# Patient Record
Sex: Female | Born: 1935
Health system: Southern US, Community
[De-identification: ages and names within clinical notes are randomized; demographics above are authoritative.]

## PROBLEM LIST (undated history)

## (undated) DIAGNOSIS — E785 Hyperlipidemia, unspecified: Secondary | ICD-10-CM

## (undated) DIAGNOSIS — C50911 Malignant neoplasm of unspecified site of right female breast: Secondary | ICD-10-CM

## (undated) DIAGNOSIS — H409 Unspecified glaucoma: Secondary | ICD-10-CM

## (undated) DIAGNOSIS — I10 Essential (primary) hypertension: Secondary | ICD-10-CM

## (undated) DIAGNOSIS — M1990 Unspecified osteoarthritis, unspecified site: Secondary | ICD-10-CM

## (undated) DIAGNOSIS — C50919 Malignant neoplasm of unspecified site of unspecified female breast: Secondary | ICD-10-CM

## (undated) HISTORY — PX: TYMPANOPLASTY: SHX33

## (undated) HISTORY — PX: TONSILLECTOMY: SUR1361

---

## 1970-07-12 DIAGNOSIS — C50919 Malignant neoplasm of unspecified site of unspecified female breast: Secondary | ICD-10-CM

## 1970-07-12 HISTORY — DX: Malignant neoplasm of unspecified site of unspecified female breast: C50.919

## 1970-08-12 HISTORY — PX: MASTECTOMY: SHX3

## 1970-08-12 HISTORY — PX: BREAST BIOPSY: SHX20

## 1971-09-12 HISTORY — PX: ABDOMINAL HYSTERECTOMY: SHX81

## 1998-02-26 ENCOUNTER — Encounter: Payer: Self-pay | Admitting: Endocrinology

## 1998-02-26 ENCOUNTER — Ambulatory Visit (HOSPITAL_COMMUNITY): Admission: RE | Admit: 1998-02-26 | Discharge: 1998-02-26 | Payer: Self-pay | Admitting: Endocrinology

## 1999-03-03 ENCOUNTER — Encounter: Payer: Self-pay | Admitting: Endocrinology

## 1999-03-03 ENCOUNTER — Ambulatory Visit (HOSPITAL_COMMUNITY): Admission: RE | Admit: 1999-03-03 | Discharge: 1999-03-03 | Payer: Self-pay | Admitting: Endocrinology

## 1999-03-10 ENCOUNTER — Other Ambulatory Visit: Admission: RE | Admit: 1999-03-10 | Discharge: 1999-03-10 | Payer: Self-pay | Admitting: Endocrinology

## 1999-04-02 ENCOUNTER — Ambulatory Visit (HOSPITAL_COMMUNITY): Admission: RE | Admit: 1999-04-02 | Discharge: 1999-04-02 | Payer: Self-pay | Admitting: Endocrinology

## 1999-04-02 ENCOUNTER — Encounter: Payer: Self-pay | Admitting: Endocrinology

## 2000-03-21 ENCOUNTER — Other Ambulatory Visit: Admission: RE | Admit: 2000-03-21 | Discharge: 2000-03-21 | Payer: Self-pay | Admitting: Endocrinology

## 2000-06-16 ENCOUNTER — Encounter: Payer: Self-pay | Admitting: Endocrinology

## 2000-06-16 ENCOUNTER — Ambulatory Visit (HOSPITAL_COMMUNITY): Admission: RE | Admit: 2000-06-16 | Discharge: 2000-06-16 | Payer: Self-pay | Admitting: Endocrinology

## 2001-03-27 ENCOUNTER — Other Ambulatory Visit: Admission: RE | Admit: 2001-03-27 | Discharge: 2001-03-27 | Payer: Self-pay | Admitting: Endocrinology

## 2001-06-14 ENCOUNTER — Ambulatory Visit (HOSPITAL_COMMUNITY): Admission: RE | Admit: 2001-06-14 | Discharge: 2001-06-14 | Payer: Self-pay | Admitting: *Deleted

## 2001-06-14 ENCOUNTER — Encounter (INDEPENDENT_AMBULATORY_CARE_PROVIDER_SITE_OTHER): Payer: Self-pay | Admitting: Specialist

## 2001-06-28 ENCOUNTER — Encounter: Payer: Self-pay | Admitting: Endocrinology

## 2001-06-28 ENCOUNTER — Ambulatory Visit (HOSPITAL_COMMUNITY): Admission: RE | Admit: 2001-06-28 | Discharge: 2001-06-28 | Payer: Self-pay | Admitting: Endocrinology

## 2002-07-02 ENCOUNTER — Ambulatory Visit (HOSPITAL_COMMUNITY): Admission: RE | Admit: 2002-07-02 | Discharge: 2002-07-02 | Payer: Self-pay | Admitting: Endocrinology

## 2002-07-02 ENCOUNTER — Encounter: Payer: Self-pay | Admitting: Endocrinology

## 2002-10-10 ENCOUNTER — Inpatient Hospital Stay (HOSPITAL_COMMUNITY): Admission: EM | Admit: 2002-10-10 | Discharge: 2002-10-14 | Payer: Self-pay | Admitting: Emergency Medicine

## 2002-10-10 ENCOUNTER — Encounter: Payer: Self-pay | Admitting: Emergency Medicine

## 2002-12-04 ENCOUNTER — Encounter (INDEPENDENT_AMBULATORY_CARE_PROVIDER_SITE_OTHER): Payer: Self-pay | Admitting: *Deleted

## 2002-12-04 ENCOUNTER — Ambulatory Visit (HOSPITAL_COMMUNITY): Admission: RE | Admit: 2002-12-04 | Discharge: 2002-12-04 | Payer: Self-pay | Admitting: *Deleted

## 2003-06-24 ENCOUNTER — Other Ambulatory Visit: Admission: RE | Admit: 2003-06-24 | Discharge: 2003-06-24 | Payer: Self-pay | Admitting: Endocrinology

## 2003-07-03 ENCOUNTER — Ambulatory Visit (HOSPITAL_COMMUNITY): Admission: RE | Admit: 2003-07-03 | Discharge: 2003-07-03 | Payer: Self-pay | Admitting: Endocrinology

## 2003-07-08 ENCOUNTER — Encounter: Admission: RE | Admit: 2003-07-08 | Discharge: 2003-07-08 | Payer: Self-pay | Admitting: Endocrinology

## 2004-01-29 ENCOUNTER — Ambulatory Visit (HOSPITAL_COMMUNITY): Admission: RE | Admit: 2004-01-29 | Discharge: 2004-01-29 | Payer: Self-pay | Admitting: *Deleted

## 2004-01-29 ENCOUNTER — Encounter (INDEPENDENT_AMBULATORY_CARE_PROVIDER_SITE_OTHER): Payer: Self-pay | Admitting: Specialist

## 2004-07-21 ENCOUNTER — Ambulatory Visit (HOSPITAL_COMMUNITY): Admission: RE | Admit: 2004-07-21 | Discharge: 2004-07-21 | Payer: Self-pay | Admitting: Endocrinology

## 2004-08-17 ENCOUNTER — Encounter (INDEPENDENT_AMBULATORY_CARE_PROVIDER_SITE_OTHER): Payer: Self-pay | Admitting: *Deleted

## 2004-08-17 ENCOUNTER — Ambulatory Visit: Admission: RE | Admit: 2004-08-17 | Discharge: 2004-08-17 | Payer: Self-pay | Admitting: *Deleted

## 2004-10-14 ENCOUNTER — Ambulatory Visit (HOSPITAL_COMMUNITY): Admission: RE | Admit: 2004-10-14 | Discharge: 2004-10-14 | Payer: Self-pay | Admitting: *Deleted

## 2005-07-22 ENCOUNTER — Ambulatory Visit (HOSPITAL_COMMUNITY): Admission: RE | Admit: 2005-07-22 | Discharge: 2005-07-22 | Payer: Self-pay | Admitting: Endocrinology

## 2005-10-13 ENCOUNTER — Encounter (INDEPENDENT_AMBULATORY_CARE_PROVIDER_SITE_OTHER): Payer: Self-pay | Admitting: *Deleted

## 2005-10-13 ENCOUNTER — Ambulatory Visit (HOSPITAL_COMMUNITY): Admission: RE | Admit: 2005-10-13 | Discharge: 2005-10-13 | Payer: Self-pay | Admitting: *Deleted

## 2006-10-05 ENCOUNTER — Ambulatory Visit (HOSPITAL_COMMUNITY): Admission: RE | Admit: 2006-10-05 | Discharge: 2006-10-05 | Payer: Self-pay | Admitting: Endocrinology

## 2006-10-26 ENCOUNTER — Encounter (INDEPENDENT_AMBULATORY_CARE_PROVIDER_SITE_OTHER): Payer: Self-pay | Admitting: *Deleted

## 2006-10-26 ENCOUNTER — Ambulatory Visit (HOSPITAL_COMMUNITY): Admission: RE | Admit: 2006-10-26 | Discharge: 2006-10-26 | Payer: Self-pay | Admitting: *Deleted

## 2007-09-06 ENCOUNTER — Encounter: Admission: RE | Admit: 2007-09-06 | Discharge: 2007-09-06 | Payer: Self-pay | Admitting: Endocrinology

## 2008-01-23 ENCOUNTER — Ambulatory Visit (HOSPITAL_COMMUNITY): Admission: RE | Admit: 2008-01-23 | Discharge: 2008-01-23 | Payer: Self-pay | Admitting: Endocrinology

## 2008-02-14 ENCOUNTER — Encounter (INDEPENDENT_AMBULATORY_CARE_PROVIDER_SITE_OTHER): Payer: Self-pay | Admitting: *Deleted

## 2008-02-14 ENCOUNTER — Ambulatory Visit (HOSPITAL_COMMUNITY): Admission: RE | Admit: 2008-02-14 | Discharge: 2008-02-14 | Payer: Self-pay | Admitting: *Deleted

## 2009-01-28 ENCOUNTER — Ambulatory Visit (HOSPITAL_COMMUNITY): Admission: RE | Admit: 2009-01-28 | Discharge: 2009-01-28 | Payer: Self-pay | Admitting: Endocrinology

## 2009-01-30 ENCOUNTER — Encounter: Admission: RE | Admit: 2009-01-30 | Discharge: 2009-01-30 | Payer: Self-pay | Admitting: Endocrinology

## 2009-05-24 ENCOUNTER — Observation Stay (HOSPITAL_COMMUNITY): Admission: EM | Admit: 2009-05-24 | Discharge: 2009-05-25 | Payer: Self-pay | Admitting: Emergency Medicine

## 2010-01-29 ENCOUNTER — Encounter
Admission: RE | Admit: 2010-01-29 | Discharge: 2010-01-29 | Payer: Self-pay | Source: Home / Self Care | Attending: Endocrinology | Admitting: Endocrinology

## 2010-02-01 ENCOUNTER — Encounter: Payer: Self-pay | Admitting: Endocrinology

## 2010-03-02 ENCOUNTER — Other Ambulatory Visit: Payer: Self-pay | Admitting: Gastroenterology

## 2010-03-30 LAB — COMPREHENSIVE METABOLIC PANEL
ALT: 17 U/L (ref 0–35)
Albumin: 3.5 g/dL (ref 3.5–5.2)
Alkaline Phosphatase: 50 U/L (ref 39–117)
Calcium: 9.1 mg/dL (ref 8.4–10.5)
Chloride: 106 mEq/L (ref 96–112)
Creatinine, Ser: 0.77 mg/dL (ref 0.4–1.2)
GFR calc non Af Amer: >60 mL/min (ref >60)
Glucose, Bld: 118 mg/dL — ABNORMAL HIGH (ref 70–99)
Potassium: 3.6 mEq/L (ref 3.5–5.1)
Total Bilirubin: 0.6 mg/dL (ref 0.3–1.2)

## 2010-03-30 LAB — CBC
Hemoglobin: 13.9 g/dL (ref 12.0–15.0)
RBC: 4.43 MIL/uL (ref 3.87–5.11)

## 2010-03-31 LAB — COMPREHENSIVE METABOLIC PANEL
CO2: 28 mEq/L (ref 19–32)
Chloride: 102 mEq/L (ref 96–112)
Creatinine, Ser: 0.83 mg/dL (ref 0.4–1.2)
GFR calc non Af Amer: >60 mL/min (ref >60)
Glucose, Bld: 148 mg/dL — ABNORMAL HIGH (ref 70–99)
Total Protein: 6.6 g/dL (ref 6.0–8.3)

## 2010-03-31 LAB — CARDIAC PANEL(CRET KIN+CKTOT+MB+TROPI)
CK, MB: 1.2 ng/mL (ref 0.3–4.0)
Relative Index: 0.5 (ref 0.0–2.5)
Relative Index: 0.5 (ref 0.0–2.5)
Total CK: 237 U/L — ABNORMAL HIGH (ref 7–177)
Troponin I: <0.01 ng/mL (ref 0.00–0.06)

## 2010-03-31 LAB — CBC
HCT: 39.4 % (ref 36.0–46.0)
MCHC: 34.2 g/dL (ref 30.0–36.0)
RDW: 12.9 % (ref 11.5–15.5)

## 2010-03-31 LAB — POCT I-STAT, CHEM 8
BUN: 18 mg/dL (ref 6–23)
Chloride: 105 mEq/L (ref 96–112)
HCT: 41 % (ref 36.0–46.0)
Hemoglobin: 13.9 g/dL (ref 12.0–15.0)
Potassium: 3.9 mEq/L (ref 3.5–5.1)
Sodium: 143 mEq/L (ref 135–145)

## 2010-03-31 LAB — LIPID PANEL
HDL: 61 mg/dL (ref >39)
LDL Cholesterol: 117 mg/dL — ABNORMAL HIGH (ref 0–99)
Total CHOL/HDL Ratio: 3.1 RATIO

## 2010-03-31 LAB — HEPATIC FUNCTION PANEL
ALT: 15 U/L (ref 0–35)
Alkaline Phosphatase: 44 U/L (ref 39–117)
Bilirubin, Direct: <0.1 mg/dL (ref 0.0–0.3)
Total Bilirubin: 0.7 mg/dL (ref 0.3–1.2)
Total Protein: 6.4 g/dL (ref 6.0–8.3)

## 2010-03-31 LAB — POCT CARDIAC MARKERS
CKMB, poc: 1.1 ng/mL (ref 1.0–8.0)
Troponin i, poc: <0.05 ng/mL (ref 0.00–0.09)

## 2010-03-31 LAB — D-DIMER, QUANTITATIVE: D-Dimer, Quant: 0.22 ug/mL-FEU (ref 0.00–0.48)

## 2010-05-26 NOTE — Op Note (Signed)
NAMEISSIS, LINDSETH NO.:  [id number]   MEDICAL RECORD NO.:  [id number]          PATIENT TYPE:  AMB   LOCATION:  ENDO                         FACILITY:  [facility]   PHYSICIAN:  Georgiana Spinner, M.D.    DATE OF BIRTH:  19-Sep-1933   DATE OF PROCEDURE:  02/14/2008  DATE OF DISCHARGE:                               OPERATIVE REPORT   PROCEDURE:  Upper endoscopy with biopsy.   INDICATIONS:  Barrett's esophagus.   ANESTHESIA:  Fentanyl 75 mcg, Versed 7 mg.   PROCEDURE:  With the patient mildly sedated in the left lateral  decubitus position, the Pentax videoscopic endoscope was inserted in the  mouth, passed under direct vision through the esophagus which appeared  normal except there was a question of Barrett's esophagus photographed  and biopsied.  We entered into the stomach.  Fundus, body, antrum,  duodenal bulb, second portion of duodenum appeared normal on direct  view.  From this point, the endoscope was slowly withdrawn taking  circumferential views of duodenal mucosa until the endoscope had been  pulled back into the stomach, placed in retroflexion to view the stomach  from below.  At that spot, a polyp was seen in the cardia.  It was  photographed and biopsied.  The endoscope was then straightened and  withdrawn, taking circumferential views of the remaining gastric and  esophageal mucosa.  The patient's vital signs and pulse oximeter  remained stable.  The patient tolerated the procedure well without  apparent complications.   FINDINGS:  Question of Barrett's esophagus.  Questionable polyp in the  cardia.   PLAN:  Await biopsy reports.  The patient will call me for results and  follow up with me as an outpatient.           ______________________________  Georgiana Spinner, M.D.     GMO/MEDQ  D:  02/14/2008  T:  02/14/2008  Job:  [id number]

## 2010-05-26 NOTE — Op Note (Signed)
NAMESHAYNNA, HUSBY NO.:  [id number]   MEDICAL RECORD NO.:  [id number]          PATIENT TYPE:  AMB   LOCATION:  ENDO                         FACILITY:  [facility]   PHYSICIAN:  Georgiana Spinner, M.D.    DATE OF BIRTH:  [birth year]   DATE OF PROCEDURE:  10/26/2006  DATE OF DISCHARGE:                               OPERATIVE REPORT   PROCEDURE:  Upper endoscopy.   INDICATIONS:  Gastroesophageal reflux disease.   ANESTHESIA:  Fentanyl 50 mcg, Versed 5 mg.   DESCRIPTION OF PROCEDURE:  With the patient mildly sedated in the left  lateral decubitus position, the Pentax videoscopic endoscope was  inserted in the mouth and passed under direct vision through the  esophagus which appeared normal.  There was one questionable area of  Barrett's esophagus that was photographed and biopsied.  We entered into  the stomach.  The fundus and body appeared normal.  The antrum showed  minimal erythema in the prepyloric area.  The duodenal bulb and second  portion of the duodenum appeared normal.  From this point, the endoscope  was slowly withdrawn taking circumferential views of the duodenal mucosa  until the endoscope had been pulled back into the stomach and placed in  retroflexion to view the stomach from below. The endoscope was  straightened and withdrawn, taking circumferential views of the  remaining gastric and esophageal mucosa.  The patient's vital signs and  pulse oximeter remained stable.  The patient tolerated the procedure  well without apparent complications.   FINDINGS:  Question of Barrett's esophagus, biopsied.  Await biopsy  report.  The patient will call me for results and follow up with me as  an outpatient.           ______________________________  Georgiana Spinner, M.D.     GMO/MEDQ  D:  10/26/2006  T:  10/26/2006  Job:  [id number]

## 2010-05-29 NOTE — Op Note (Signed)
[facility]  Patient:    Amy Tapia, Amy Tapia Visit Number: [id number] MRN: [id number]          Service Type: END Location: ENDO Attending Physician:  Sabino Gasser Dictated by:   Sabino Gasser, M.D. Admit Date:  06/14/2001 Discharge Date: 06/14/2001                             Operative Report  PROCEDURE:  Upper endoscopy.  INDICATIONS:  GERD.  ANESTHESIA:  Demerol 50 mg, Versed 5 mg.  PROCEDURE:  With the patient mildly sedated in the left lateral decubitus position, the Olympus videoscopic endoscope was inserted into the mouth, passed under direct vision through the esophagus where distal esophagus was approached and there was a questionable area of short segments Barretts esophagus photographed and biopsied. We entered into the stomach, fundus, body, antrum, duodenal bulb, second portion duodenum were all visualized. From this point, the endoscope was slowly withdrawn taking circumferential views of the entire duodenal mucosa until the endoscope then pulled back into the stomach, placed in retroflexion to view the stomach from below. The endoscope was then straightened and withdrawn taking circumferential views of the remaining gastric and esophageal mucosa. The patients vital signs, pulse oximeter remained stable. The patient tolerated the procedure well without apparent complications.  FINDINGS:  Question of a short segment Barretts esophagus.  PLAN:  Await biopsy report. The patient will call me for results and follow up with me as an outpatient. Proceed to colonoscopy as planned. Dictated by:   Sabino Gasser, M.D. Attending Physician:  Sabino Gasser DD:  06/14/01 TD:  06/15/01 Job: [id number]

## 2010-05-29 NOTE — Op Note (Signed)
NAMEHADLEIGH, FELBER NO.:  [id number]   MEDICAL RECORD NO.:  [id number]          PATIENT TYPE:  AMB   LOCATION:  ENDO                         FACILITY:  [facility]   PHYSICIAN:  Georgiana Spinner, M.D.    DATE OF BIRTH:  1933-08-09   DATE OF PROCEDURE:  10/13/2005  DATE OF DISCHARGE:                                 OPERATIVE REPORT   PROCEDURE:  Upper endoscopy with biopsy.   INDICATIONS:  Gastroesophageal reflux disease with endoscopic Barrett's  esophagus.   ANESTHESIA:  Fentanyl 50 mcg, Versed 2 mg.   PROCEDURE:  The patient mildly sedated in the left lateral decubitus  position.  The Olympus videoscopic endoscope was inserted in the mouth,  passed under direct vision through the esophagus which appeared normal until  we reached distal esophagus.  There appeared to be short thin flames of  Barrett's esophagus, photographed and we attempted to biopsy it within the  folds of the esophagus.  We entered into the stomach.  Fundus, body, antrum,  duodenal bulb, second portion duodenum were dark but no gross lesions seen.  From this point the endoscope was slowly withdrawn, taking circumferential  views of the duodenal mucosa until the endoscope had been pulled back into  the stomach, placed in retroflexion to view the stomach from below.  The  endoscope was straightened and withdrawn, taking circumferential views of  the remaining gastric and esophageal mucosa.  The patient's vital signs,  pulse oximeter remained stable.  The patient tolerated procedure well  without apparent complications.   FINDINGS:  Question of Barrett's esophagus biopsied.  Await biopsy report.  The patient will call me for results and follow-up with me as an outpatient.           ______________________________  Georgiana Spinner, M.D.     GMO/MEDQ  D:  10/13/2005  T:  10/14/2005  Job:  [id number]

## 2010-05-29 NOTE — Discharge Summary (Signed)
   NAME:  Amy Tapia, Amy Tapia                           ACCOUNT NO.:  [id number]   MEDICAL RECORD NO.:  [id number]                   PATIENT TYPE:  INP   LOCATION:  0482                                 [facility]ACILITY:  [facility]   PHYSICIAN:  Alfonse Spruce, M.D.               DATE OF BIRTH:  29-Apr-1933   DATE OF ADMISSION:  10/10/2002  DATE OF DISCHARGE:  10/14/2002                                 DISCHARGE SUMMARY   FINAL DIAGNOSES:  1. Acute diverticulitis, sigmoid.  2. Hypertension.  3. History of colon polyp with previous polypectomy by Dr. Sabino Gasser.  4. History of modified radical mastectomy 30 years ago for breast carcinoma.   ALLERGIES:  PENICILLIN and SULAR.   HOSPITAL COURSE:  The patient was admitted through the emergency room after  she had a CT scan, and she was complaining of severe abdominal pain  associated with cramping and leukocytosis.  Started on IV antibiotics, and  for the IV treatment consultation with surgeon, Dr. Derrell Lolling, as well as Dr.  Sabino Gasser, and subsequently the patient will be seen by Dr. Sabino Gasser at  an appointment of October 26, 2002, for further follow-up and possible  further colonoscopy.  The patient is able to tolerate the regular diet,  ambulatory, and she will be discharged today in a stable general condition  to continue with the oral antibiotic, Levaquin 500 mg p.o. daily, and  prescription given.  She will be also on atenolol, and she has been on  atenolol 50 mg p.o. b.i.d., and she will continue her oral medication at  home as well as her aspirin 81 mg.  Stable on discharge.   ACTIVITY:  As tolerated.   DIET:  Low-fat residue diet.                                               Alfonse Spruce, M.D.    Wynn Maudlin  D:  10/14/2002  T:  10/14/2002  Job:  [id number]   cc:   Brooke Bonito, M.D.  [address]  [place]  [place] [address]  Fax: [phone]   Georgiana Spinner, M.D.  [address]  [place]  [place] [address]  Fax:  [phone]   Angelia Mould. Derrell Lolling, M.D.  [address], [address]  [place]  [place] [address]  Fax: [phone]

## 2010-05-29 NOTE — Op Note (Signed)
NAMEAREN, CHERNE NO.:  [id number]   MEDICAL RECORD NO.:  [id number]          PATIENT TYPE:  AMB   LOCATION:  ENDO                         FACILITY:  [facility]   PHYSICIAN:  Georgiana Spinner, M.D.    DATE OF BIRTH:  1933-11-13   DATE OF PROCEDURE:  01/29/2004  DATE OF DISCHARGE:                                 OPERATIVE REPORT   PROCEDURE:  Upper endoscopy.   INDICATIONS:  Gastroesophageal reflux disease with Barrett's esophagus.   ANESTHESIA:  Demerol 60, Versed 6 milligrams.   PROCEDURE:  With the patient mildly sedated in the left lateral decubitus  position, the Olympus videoscopic endoscope was inserted in the mouth and  passed under direct vision through the esophagus where Barrett's esophagus  was seen, photographed and biopsied. We entered into the stomach. The fundus  and body appeared normal. The antrum showed erythema as did the duodenal  bulb.  The second portion of the duodenum appeared normal. From this point,  the endoscope was slowly withdrawn taking circumferential views of the  duodenal mucosa until the endoscope had been pulled back into the stomach,  placed in retroflexion to view the stomach from below. The endoscope was  straightened and withdrawn taking circumferential views of the remaining  gastric and esophageal mucosa stopping only to biopsy the erythema in the  antrum. The patient's vital signs and pulse oximeter remained stable. The  patient tolerated the procedure well without apparent complications.   FINDINGS:  Antral erythema, duodenal erythema,  Barrett's esophagus above a  hiatal hernia. Await biopsy reports. The patient will call me for results  and follow-up with me as an outpatient.      GMO/MEDQ  D:  01/29/2004  T:  01/29/2004  Job:  [id number]

## 2010-05-29 NOTE — Op Note (Signed)
[facility]  Patient:    Amy Tapia, Amy Tapia Visit Number: [id number] MRN: [id number]          Service Type: END Location: ENDO Attending Physician:  Sabino Gasser Dictated by:   Sabino Gasser, M.D. Admit Date:  06/14/2001 Discharge Date: 06/14/2001                             Operative Report  PROCEDURE:  Colonoscopy.  INDICATIONS:  Colon polyps, colon cancer screening.  ANESTHESIA:  Demerol 40 mg, Versed 3 mg.  PROCEDURE:  With patient mildly sedated in the left lateral decubitus position, the Olympus videoscopic colonoscope was inserted into the rectum and passed under direct vision into the cecum, identified by the ileocecal valve and appendiceal orifice, both of which were photographed. From this point, the colonoscope was slowly withdrawn taking circumferential views of the entire colonic mucosa, pulling all the way back to the rectum at which point, the endoscope was placed in retroflexion to view the anal canal from above, which was photographed. We stopped only at 20 and 40 cm from the anal verge. At 40 cm, there was a large polyp seen on a stalk, photographed, and removed using snare cautery technique, setting of 20/20 blended current, and a second smaller polyp seen at 20 cm was removed using hot biopsy forceps technique. Once again, a setting of 20/20 blended current. The endoscope was then withdrawn. The patients vital signs, pulse oximeter remained stable. The patient tolerated the procedure well without apparent complications.  FINDINGS:  Polyp on a stalk at 40 cm, greater than a cm in size, photographed and biopsied and removed. Polyp at 20 cm removed using hot biopsy forceps technique, also photographed.  PLAN:  Await biopsy report. The patient will call me for results and followup with me as an outpatient. Dictated by:   Sabino Gasser, M.D. Attending Physician:  Sabino Gasser DD:  06/14/01 TD:  06/16/01 Job: [id number]

## 2010-05-29 NOTE — Op Note (Signed)
NAMEANTONIO, Amy Tapia NO.:  [id number]   MEDICAL RECORD NO.:  [id number]          PATIENT TYPE:  AMB   LOCATION:  ENDO                         FACILITY:  [facility]   PHYSICIAN:  Georgiana Spinner, M.D.    DATE OF BIRTH:  [birth year]   DATE OF PROCEDURE:  DATE OF DISCHARGE:                                 OPERATIVE REPORT   PROCEDURE:  Colonoscopy.   INDICATIONS:  Colon polyp.   ANESTHESIA:  Demerol 70, versed 6 mg.   PROCEDURE:  With the patient mildly sedated and in the left lateral  decubitus position, the Olympus videoscopic colonoscope was inserted into  the rectum and passed with pressure applied to the abdomen.  With the  patient turned to her back, we were able to reach the cecum identified by  the ileocecal valve and appendiceal orifice, both of which are photographed.  From that point, the colonoscope was slowly withdrawn, taking  circumferential views of the colonic mucosa and stopping in the rectum,  which appeared normal.  The rectum showed hemorrhoids in the retroflexed  view.  The endoscope was straightened and withdrawn.  The patient's vital  signs and pulse oximetry remained stable.  The patient tolerated the  procedure well with no apparent complications.   FINDINGS:  Diverticulosis of the sigmoid colon seen on the way in as well as  internal hemorrhoids seen on retroflexed view.  Otherwise an unremarkable  exam.   PLAN:  Repeat examination, possibly in 5 to 10 years.           ______________________________  Georgiana Spinner, M.D.     GMO/MEDQ  D:  10/14/2004  T:  10/14/2004  Job:  [id number]

## 2010-05-29 NOTE — Op Note (Signed)
NAME:  Amy Tapia, Amy Tapia                           ACCOUNT NO.:  [id number]   MEDICAL RECORD NO.:  [id number]                   PATIENT TYPE:  AMB   LOCATION:  ENDO                                 FACILITY:  [facility]   PHYSICIAN:  Georgiana Spinner, M.D.                 DATE OF BIRTH:  09-Apr-1933   DATE OF PROCEDURE:  12/04/2002  DATE OF DISCHARGE:                                 OPERATIVE REPORT   PROCEDURE PERFORMED:  Colonoscopy with polypectomy.   ENDOSCOPIST:  Georgiana Spinner, M.D.   INDICATIONS FOR PROCEDURE:  Rectal bleeding and history of polyps.   ANESTHESIA:  Demerol 25 mg, Versed 2.5 mg.   DESCRIPTION OF PROCEDURE:  With the patient mildly sedated in the left  lateral decubitus position, the Olympus video colonoscope was inserted in  the rectum and passed with pressure applied to the abdomen through a very  tortuous colon to the cecum, identified by the ileocecal valve and  appendiceal orifice, both of which were photographed.  From this point the  colonoscope was slowly withdrawn taking circumferential views of the entire  colonic mucosa stopping only at 20 cm from the anal verge at which point two  polyps were seen and photographed, one of them, but both were removed using  snare cautery technique at setting 20/200 with the Erbe argon  photocoagulator.  Tissue was retrieved for pathology.  The endoscope was  then withdrawn all the way to the rectum which appeared normal on direct and  showed hemorrhoids on retroflex view.  The endoscope was straightened and  withdrawn.  The patient's vital signs and pulse oximeter remained stable.  The patient tolerated the procedure well without apparent complications.   FINDINGS:  Sigmoid diverticulosis.  Polyps at 20 cm removed.  Internal  hemorrhoids.   PLAN:  Await pathology report.  Patient will call me for results and follow  up with me as an outpatient.                                               Georgiana Spinner, M.D.    GMO/MEDQ   D:  12/04/2002  T:  12/05/2002  Job:  [id number]

## 2010-05-29 NOTE — Op Note (Signed)
NAME:  Amy Tapia, Amy Tapia                           ACCOUNT NO.:  [id number]   MEDICAL RECORD NO.:  [id number]                   PATIENT TYPE:  AMB   LOCATION:  ENDO                                 FACILITY:  [facility]   PHYSICIAN:  Georgiana Spinner, M.D.                 DATE OF BIRTH:  03/11/1933   DATE OF PROCEDURE:  12/04/2002  DATE OF DISCHARGE:                                 OPERATIVE REPORT   PROCEDURE PERFORMED:  Upper endoscopy.   ENDOSCOPIST:  Georgiana Spinner, M.D.   INDICATIONS FOR PROCEDURE:  Gastroesophageal reflux disease.  Rule out  Barrett's esophagus.   ANESTHESIA:  Demerol 40 mg, Versed 4 mg.   DESCRIPTION OF PROCEDURE:  With the patient mildly sedated in the left  lateral decubitus position, the Olympus video endoscope was inserted in the  mouth and passed under direct vision through the esophagus where there was a  question of a short segment of Barrett's esophagus seen, photographed and  biopsied.  We entered into the stomach.  The fundus, body, antrum, duodenal  bulb and second portion of the duodenum all appeared normal.  From this  point, the endoscope was slowly withdrawn taking circumferential views of  the duodenal mucosa until the endoscope was pulled back into the stomach and  placed on retroflexion to view the stomach from below and a hiatal hernia  was seen and photographed.  The endoscope was then straightened and  withdrawn taking circumferential views of the remaining gastric and  esophageal mucosa.  The patient's vital signs and pulse oximeter remained  stable.  The patient tolerated the procedure well without apparent  complications.   FINDINGS:  Question of Barrett's esophagus above a hiatal hernia.  Biopsied,  await biopsy report.  Patient will call me for results and follow up with me  as an outpatient.  Proceed to colonoscopy.                                               Georgiana Spinner, M.D.    GMO/MEDQ  D:  12/04/2002  T:  12/05/2002  Job:   [id number]

## 2010-05-29 NOTE — H&P (Signed)
NAME:  Amy Tapia, Amy Tapia                           ACCOUNT NO.:  [id number]   MEDICAL RECORD NO.:  [id number]                   PATIENT TYPE:  INP   LOCATION:  0482                                 FACILITY:  [facility]   PHYSICIAN:  Alfonse Spruce, M.D.               DATE OF BIRTH:  10/29/1933   DATE OF ADMISSION:  10/10/2002  DATE OF DISCHARGE:                                HISTORY & PHYSICAL   PRIMARY CARE PHYSICIAN:  Claudie Fisherman, M.D.   CHIEF COMPLAINT:  The patient had started to have, yesterday at 11 a.m.,  lower abdominal pain associated with fever and temperature of 101.3.  The  pain became during the night more proximate with cramping type in the lower  abdomen and she denied any diarrhea with it; however, the pain has continued  to be bothersome.  She denied any special food; however, she eat a tomato  with the seeds.  She denied any history of diverticulosis in the past.  She  did not have nausea, vomiting, hematemesis, or melena with the event.   HISTORY OF PRESENT ILLNESS:  Amy Tapia, who is 77 year old white female,  experienced at 11 a.m. yesterday morning abdominal pain and this abdominal  pain became cramps and progressively worsening during the night, became  intolerable, was 10/10, and she waited until today until she came to the  emergency room at [facility] Emergency Room brought by her  husband.   PAST MEDICAL HISTORY:  1. She had esophagogastroduodenoscopy and colonoscopy taking out two polyps     by Dr. Sabino Gasser in the summer of 2003 or early fall.  2. Otherwise, she had no other problem except hypertension and hypertension     was controlled well with atenolol b.i.d.   MEDICATION LIST:  1. Atenolol 50 mg b.i.d.  2. Aspirin 81 mg daily.  3. Vitamin C.  4. Vitamin E.  5. Garlic.  6. Calcium.   PAST GYNECOLOGIC HISTORY:  She is G1, P1, and she had one son at the age of  64.   PAST SURGICAL HISTORY:  She had a hysterectomy in the past.   PAST MEDICAL HISTORY:  1. Hypertension.  2. No diabetes mellitus.  3. She had a radical mastectomy 32 years ago for right breast cancer.  At     that time she was pregnant and there was no irradiation and no     chemotherapy was given to her.  4. She had a hysterectomy 31 years ago.   FAMILY HISTORY:  Noncontributory and no other family member has any problem.   REVIEW OF SYSTEMS:  NEUROPSYCHIATRY:  Negative.  No strokes in the past, no  headache or blurred vision.  CARDIOVASCULAR:  No MI and no palpitation;  however, she had allergy to SULAR and PENICILLIN.  Sular caused some  palpitation and PENICILLIN caused rash.  GENITOURINARY:  She had frequent  urination but no dysuria.  GASTROINTESTINAL:  No hematemesis, melena,  hematochezia, nausea or vomiting.  ENDOCRINE:  No diabetes and no thyroid  disease.   PHYSICAL EXAMINATION:  GENERAL:  The patient is seen at [facility]  Emergency Room.  The patient is conscious, alert and oriented x3.  VITAL SIGNS:  Blood pressure 98/59 with low blood pressure at the time seen;  however, she was conscious, alert, oriented.  Pulse 81, respiratory rate 20,  pulse oximetry 98, temperature  99.4.  HEENT:  Pupils was equal and reactive.  Oropharynx was normal dentures and  good oral hygiene.  NECK:  Supple, no JVD, no thyromegaly, no lymphadenopathy.  CHEST:  Clear to auscultation and percussion.  HEART:  PMI at the fifth intercostal space midclavicular line.  Normal S1  and S2, no S3, S4, no gallop.  ABDOMEN:  Soft; however, there is tenderness in the pelvic area and mostly  on the left side with the occasionally feeling of rebound.  EXTREMITIES:  The pulses were intact in the lower extremities.  No pedal  edema.  NEUROLOGICAL:  Cranial nerves II-XII were intact.  Motor and sensory intact.  DTRs 2+/2+ and patient is ambulatory.   LABORATORY DATA:  White count 14.7, hematocrit 43.3, platelet count 276.  Sodium 137, potassium 4.2, chloride 101,  carbon dioxide 28, BUN 15,  creatinine 0.8, blood sugar 112.  Liver enzymes normal with AST 33, ALT 33,  bilirubin 1.  She had an urinalysis essentially negative.   She had a CAT scan of the abdomen with presence of edema of the bowel and  lower bowel with tenderness and edema of the lower bowel by the CAT scan as  reviewed by radiologist.  I reviewed it with the radiologist as well, with  the acute diverticulitis, no evidence of diverticular rupture at the time of  the examination.   IMPRESSION:  1. Acute diverticular disease with abdominal pain with the need for     intravenous antibiotic.  The patient is allergic to PENICILLIN and will     start on Cipro and clindamycin.  The patient will be admitted to the     hospital.  2. Underlying hypotension.  Will hold the atenolol as well and the aspirin.     We will start her on the intravenous antibiotics and intravenous fluids.     Will obtain CBC with differential and CMET in the morning.  Further     consultation with Dr. Sabino Gasser and surgeon if there is no improvement     with the intravenous antibiotics.                                               Alfonse Spruce, M.D.    Wynn Maudlin  D:  10/10/2002  T:  10/10/2002  Job:  [id number]   cc:   Claudie Fisherman, M.D.

## 2010-05-29 NOTE — Consult Note (Signed)
NAME:  Amy Tapia, Amy Tapia                           ACCOUNT NO.:  [id number]   MEDICAL RECORD NO.:  [id number]                   PATIENT TYPE:  INP   LOCATION:  0482                                 FACILITY:  [facility]   PHYSICIAN:  Angelia Mould. Derrell Lolling, M.D.             DATE OF BIRTH:  April 12, 1933   DATE OF CONSULTATION:  10/12/2002  DATE OF DISCHARGE:                                   CONSULTATION   REASON FOR CONSULTATION:  Evaluate diverticulitis.   HISTORY OF PRESENT ILLNESS:  This is a 77 year old white female who was  admitted to [facility] on October 10, 2002, following a 24 hour  history of lower abdominal pain, low-grade fever, and cramps.  She denied  diarrhea, denied seeing any blood in her stool, denied any hard shaking  chills.  She saw Dr. Juleen China in the office.  He sent her to the emergency room  where a CT scan showed thickening and inflammatory changes around the  sigmoid colon, but no evidence of any perforation, no extra luminal gas, and  no abscess.   She has been hospitalized for 48 hours now on IV antibiotics, and states  that she is actually feeling better.  She was thought to have rebound  tenderness on exam this morning.  I was called to see her.   PAST MEDICAL HISTORY:  1. History of colon polyps.  2. History of reflux disease.  She underwent upper endoscopy and colonoscopy     by Dr. Sabino Gasser one year ago.  3. Hypertension.  4. She also underwent a right modified radical mastectomy 30 years ago for     breast cancer.  5. She also underwent right and left tympanoplasties.  6. TAH and BSO.   CURRENT MEDICATIONS:  1. Atenolol 50 mg b.i.d.  2. Aspirin 81 mg daily.  3. Vitamin C.  4. Vitamin E.  5. Garlic.  6. Calcium.   ALLERGIES:  1. PENICILLIN.  2. SULAR.   FAMILY HISTORY:  Noncontributory and no other family member has any specific  problems.   REVIEW OF SYSTEMS:  NEUROLOGIC:  No strokes, no seizures, no headache or  blurry vision.   CARDIOVASCULAR:  No MI, no palpitations, no chest pain.  GENITOURINARY:  Frequent urination, but no dysuria.  No gross pelvic floral  problems.  GASTROINTESTINAL:  No prior history of any serious inflammatory  disease, although she has had colon polyps and also was told she had  gastroesophageal reflux disease by Dr. Virginia Rochester.  ENDOCRINE:  Denies diabetes or  thyroid disease.   PHYSICAL EXAMINATION:  GENERAL:  A very pleasant older middle-aged woman in  no distress.  She is a good historian.  VITAL SIGNS:  Temperature 97.4, blood pressure 110/50, heart rate 59.  EYES:  Sclerae clear, extraocular movements intact.  NECK:  Supple, no adenopathy, no thyromegaly, no jugular venous distention.  Trachea midline.  CHEST:  Clear to auscultation.  No CVA tenderness.  HEART:  Regular rate and rhythm.  No murmurs, rubs, or gallops.  ABDOMEN:  Soft.  She is tender in the left lower quadrant, but this is mild.  There is no mass, no peritoneal signs, no significant guarding.  She has  active bowel sounds.  BREASTS:  Not examined.  EXTREMITIES:  No edema, good pulses.   LABORATORY DATA:  White blood cell count was 14,700 on admission, is now  down to 4900.  Hemoglobin is 10.4.  Complete metabolic panel is normal.  Liver function tests are normal.  Urinalysis unremarkable.   IMPRESSION:  1. Acute diverticulitis.  This is apparently uncomplicated and is responding     to antibiotic therapy and bowel rest.  2. History of right mastectomy for breast cancer.  3. Status post total abdominal hysterectomy and bilateral salpingo-     oophorectomy.  4. Hypertension.  5. History of colon polyps.   PLAN:  1. There is no indication for surgical intervention at this point.  2. I recommend that she continue on intravenous antibiotics that she is on     and that we begin a fortified clear liquid diet.  3. The patient should follow up with Dr. Sabino Gasser as an outpatient for     consideration of colonoscopy at  some point in the future.                                                 Angelia Mould. Derrell Lolling, M.D.    HMI/MEDQ  D:  10/12/2002  T:  10/13/2002  Job:  [id number]   cc:   Alfonse Spruce, M.D.   Brooke Bonito, M.D.  [address]  [place]  [place] [address]  Fax: [phone]   Georgiana Spinner, M.D.  [address]  [place]  [place] [address]  Fax: [phone]

## 2010-05-29 NOTE — Op Note (Signed)
NAMEMEHR, DEPAOLI NO.:  [id number]   MEDICAL RECORD NO.:  [id number]          PATIENT TYPE:  AMB   LOCATION:  DFTL                         FACILITY:  [facility]   PHYSICIAN:  Georgiana Spinner, M.D.    DATE OF BIRTH:  [birth year]   DATE OF PROCEDURE:  DATE OF DISCHARGE:                                 OPERATIVE REPORT   PROCEDURE:  Upper endoscopy.   INDICATIONS:  GERD with Barrett's.   ANESTHESIA:  Demerol 40, Versed 4 mg.   ENDOSCOPIST:  Georgiana Spinner, M.D.   DESCRIPTION OF PROCEDURE:  With the patient mildly sedated in the left  lateral decubitus position, the Olympus videoscopic endoscope was inserted  in the mouth and passed under direct vision through the esophagus which  appeared normal until we reached the distal esophagus, and  there were  changes of Barrett's seen, photographed, and biopsied. We entered into the  stomach.  The fundus, body, antrum, duodenal bulb, and second portion  duodenum all appeared normal. From this point, the endoscope was slowly  withdrawn taking circumferential views of the duodenal mucosa until the  endoscope had been  pulled back into the stomach, placed in retroflexion,  viewing the stomach from below. The endoscope was straightened and withdrawn  taking circumferential views of the remaining gastric and esophageal mucosa.  The patient's vital signs and pulse oximeter remained stable. The patient  tolerated the procedure well and without apparent complications.   FINDINGS:  Barrett's esophagus, biopsied, await biopsy report. The patient  will call me for results and follow up with me as an outpatient.       GMO/MEDQ  D:  08/17/2004  T:  08/17/2004  Job:  [id number]

## 2010-12-28 ENCOUNTER — Other Ambulatory Visit: Payer: Self-pay | Admitting: Endocrinology

## 2010-12-28 DIAGNOSIS — Z1231 Encounter for screening mammogram for malignant neoplasm of breast: Secondary | ICD-10-CM

## 2010-12-28 DIAGNOSIS — Z9011 Acquired absence of right breast and nipple: Secondary | ICD-10-CM

## 2011-02-02 ENCOUNTER — Ambulatory Visit
Admission: RE | Admit: 2011-02-02 | Discharge: 2011-02-02 | Disposition: A | Discharge status: Home / Self Care | Payer: Medicare Other | Source: Ambulatory Visit | Attending: Endocrinology | Admitting: Endocrinology

## 2011-02-02 DIAGNOSIS — Z9011 Acquired absence of right breast and nipple: Secondary | ICD-10-CM

## 2011-02-02 DIAGNOSIS — Z1231 Encounter for screening mammogram for malignant neoplasm of breast: Secondary | ICD-10-CM

## 2012-01-19 ENCOUNTER — Other Ambulatory Visit: Payer: Self-pay | Admitting: Endocrinology

## 2012-01-19 DIAGNOSIS — Z853 Personal history of malignant neoplasm of breast: Secondary | ICD-10-CM

## 2012-01-19 DIAGNOSIS — Z9011 Acquired absence of right breast and nipple: Secondary | ICD-10-CM

## 2012-01-19 DIAGNOSIS — Z1231 Encounter for screening mammogram for malignant neoplasm of breast: Secondary | ICD-10-CM

## 2012-02-15 ENCOUNTER — Ambulatory Visit
Admission: RE | Admit: 2012-02-15 | Discharge: 2012-02-15 | Disposition: A | Discharge status: Home / Self Care | Payer: Medicare Other | Source: Ambulatory Visit | Attending: Endocrinology | Admitting: Endocrinology

## 2012-02-15 DIAGNOSIS — Z853 Personal history of malignant neoplasm of breast: Secondary | ICD-10-CM

## 2012-02-15 DIAGNOSIS — Z1231 Encounter for screening mammogram for malignant neoplasm of breast: Secondary | ICD-10-CM

## 2012-02-15 DIAGNOSIS — Z9011 Acquired absence of right breast and nipple: Secondary | ICD-10-CM

## 2013-01-23 ENCOUNTER — Other Ambulatory Visit: Payer: Self-pay

## 2013-01-23 DIAGNOSIS — Z853 Personal history of malignant neoplasm of breast: Secondary | ICD-10-CM

## 2013-01-23 DIAGNOSIS — Z1231 Encounter for screening mammogram for malignant neoplasm of breast: Secondary | ICD-10-CM

## 2013-01-23 DIAGNOSIS — Z9011 Acquired absence of right breast and nipple: Secondary | ICD-10-CM

## 2013-02-16 ENCOUNTER — Ambulatory Visit
Admission: RE | Admit: 2013-02-16 | Discharge: 2013-02-16 | Disposition: A | Discharge status: Home / Self Care | Payer: Medicare HMO | Source: Ambulatory Visit

## 2013-02-16 DIAGNOSIS — Z9011 Acquired absence of right breast and nipple: Secondary | ICD-10-CM

## 2013-02-16 DIAGNOSIS — Z853 Personal history of malignant neoplasm of breast: Secondary | ICD-10-CM

## 2013-02-16 DIAGNOSIS — Z1231 Encounter for screening mammogram for malignant neoplasm of breast: Secondary | ICD-10-CM

## 2013-04-11 HISTORY — PX: CATARACT EXTRACTION W/ INTRAOCULAR LENS IMPLANT: SHX1309

## 2013-10-10 ENCOUNTER — Emergency Department (HOSPITAL_COMMUNITY): Payer: Medicare HMO

## 2013-10-10 ENCOUNTER — Encounter (HOSPITAL_COMMUNITY): Payer: Self-pay | Admitting: Emergency Medicine

## 2013-10-10 ENCOUNTER — Observation Stay (HOSPITAL_COMMUNITY)
Admission: EM | Admit: 2013-10-10 | Discharge: 2013-10-11 | Disposition: A | Discharge status: Home / Self Care | Payer: Medicare HMO | Attending: Emergency Medicine | Admitting: Emergency Medicine

## 2013-10-10 DIAGNOSIS — Z87828 Personal history of other (healed) physical injury and trauma: Secondary | ICD-10-CM | POA: Diagnosis not present

## 2013-10-10 DIAGNOSIS — M79602 Pain in left arm: Secondary | ICD-10-CM | POA: Diagnosis present

## 2013-10-10 DIAGNOSIS — R079 Chest pain, unspecified: Secondary | ICD-10-CM | POA: Diagnosis present

## 2013-10-10 DIAGNOSIS — Z79899 Other long term (current) drug therapy: Secondary | ICD-10-CM | POA: Insufficient documentation

## 2013-10-10 DIAGNOSIS — M25539 Pain in unspecified wrist: Secondary | ICD-10-CM | POA: Diagnosis not present

## 2013-10-10 DIAGNOSIS — Z880 Allergy status to penicillin: Secondary | ICD-10-CM | POA: Diagnosis not present

## 2013-10-10 DIAGNOSIS — Z7982 Long term (current) use of aspirin: Secondary | ICD-10-CM | POA: Diagnosis not present

## 2013-10-10 DIAGNOSIS — M549 Dorsalgia, unspecified: Secondary | ICD-10-CM | POA: Insufficient documentation

## 2013-10-10 DIAGNOSIS — R61 Generalized hyperhidrosis: Secondary | ICD-10-CM | POA: Insufficient documentation

## 2013-10-10 DIAGNOSIS — Z23 Encounter for immunization: Secondary | ICD-10-CM | POA: Insufficient documentation

## 2013-10-10 DIAGNOSIS — M25532 Pain in left wrist: Secondary | ICD-10-CM | POA: Insufficient documentation

## 2013-10-10 HISTORY — DX: Essential (primary) hypertension: I10

## 2013-10-10 LAB — BASIC METABOLIC PANEL
Anion gap: 11 (ref 5–15)
BUN: 17 mg/dL (ref 6–23)
CO2: 25 meq/L (ref 19–32)
Calcium: 9.1 mg/dL (ref 8.4–10.5)
Chloride: 103 mEq/L (ref 96–112)
Creatinine, Ser: 0.86 mg/dL (ref 0.50–1.10)
GFR calc Af Amer: 74 mL/min — ABNORMAL LOW (ref >90)
GFR calc non Af Amer: 64 mL/min — ABNORMAL LOW (ref >90)
Glucose, Bld: 114 mg/dL — ABNORMAL HIGH (ref 70–99)
Potassium: 4.3 mEq/L (ref 3.7–5.3)
Sodium: 139 mEq/L (ref 137–147)

## 2013-10-10 LAB — URINALYSIS, ROUTINE W REFLEX MICROSCOPIC
Bilirubin Urine: NEGATIVE
Glucose, UA: NEGATIVE mg/dL
Hgb urine dipstick: NEGATIVE
Ketones, ur: NEGATIVE mg/dL
Leukocytes, UA: NEGATIVE
Nitrite: NEGATIVE
PROTEIN: NEGATIVE mg/dL
Specific Gravity, Urine: 1.004 — ABNORMAL LOW (ref 1.005–1.030)
Urobilinogen, UA: 0.2 mg/dL (ref 0.0–1.0)
pH: 5 (ref 5.0–8.0)

## 2013-10-10 LAB — CBC
HCT: 40.4 % (ref 36.0–46.0)
Hemoglobin: 13.5 g/dL (ref 12.0–15.0)
MCH: 29.9 pg (ref 26.0–34.0)
MCHC: 33.4 g/dL (ref 30.0–36.0)
MCV: 89.6 fL (ref 78.0–100.0)
Platelets: 250 10*3/uL (ref 150–400)
RBC: 4.51 MIL/uL (ref 3.87–5.11)
RDW: 13.5 % (ref 11.5–15.5)
WBC: 5.4 10*3/uL (ref 4.0–10.5)

## 2013-10-10 LAB — I-STAT TROPONIN, ED: Troponin i, poc: 0 ng/mL (ref 0.00–0.08)

## 2013-10-10 LAB — TROPONIN I: Troponin I: <0.3 ng/mL (ref <0.30)

## 2013-10-10 MED ORDER — LATANOPROST 0.005 % OP SOLN
1.0000 [drp] | Freq: Every day | OPHTHALMIC | Status: DC
  Administered 2013-10-10: 1 [drp] via OPHTHALMIC
  Filled 2013-10-10: qty 2.5

## 2013-10-10 MED ORDER — HYDROCODONE-ACETAMINOPHEN 5-325 MG PO TABS
1.0000 | ORAL_TABLET | ORAL | Status: DC | PRN
  Administered 2013-10-10 – 2013-10-11 (×2): 1 via ORAL
  Filled 2013-10-10 (×2): qty 1

## 2013-10-10 MED ORDER — ACETAMINOPHEN 325 MG PO TABS
650.0000 mg | ORAL_TABLET | ORAL | Status: DC | PRN
  Administered 2013-10-11: 650 mg via ORAL
  Filled 2013-10-10: qty 2

## 2013-10-10 MED ORDER — TIMOLOL MALEATE 0.5 % OP SOLN
1.0000 [drp] | Freq: Two times a day (BID) | OPHTHALMIC | Status: DC
  Administered 2013-10-10 – 2013-10-11 (×2): 1 [drp] via OPHTHALMIC
  Filled 2013-10-10: qty 5

## 2013-10-10 MED ORDER — NITROGLYCERIN 0.4 MG SL SUBL
0.4000 mg | SUBLINGUAL_TABLET | SUBLINGUAL | Status: DC | PRN
Start: 2013-10-10 — End: 2013-10-10
  Administered 2013-10-10 (×2): 0.4 mg via SUBLINGUAL
  Filled 2013-10-10: qty 1

## 2013-10-10 MED ORDER — ONDANSETRON HCL 4 MG/2ML IJ SOLN
4.0000 mg | Freq: Once | INTRAMUSCULAR | Status: AC
  Administered 2013-10-10: 4 mg via INTRAVENOUS
  Filled 2013-10-10: qty 2

## 2013-10-10 MED ORDER — MORPHINE SULFATE 2 MG/ML IJ SOLN
2.0000 mg | Freq: Once | INTRAMUSCULAR | Status: AC
  Administered 2013-10-10: 2 mg via INTRAVENOUS
  Filled 2013-10-10: qty 1

## 2013-10-10 MED ORDER — SODIUM CHLORIDE 0.9 % IV SOLN
INTRAVENOUS | Status: AC
  Administered 2013-10-10: 19:00:00 via INTRAVENOUS

## 2013-10-10 MED ORDER — SODIUM CHLORIDE 0.9 % IV SOLN
INTRAVENOUS | Status: DC
  Administered 2013-10-10: 18:00:00 via INTRAVENOUS

## 2013-10-10 MED ORDER — INFLUENZA VAC SPLIT QUAD 0.5 ML IM SUSY
0.5000 mL | PREFILLED_SYRINGE | INTRAMUSCULAR | Status: AC
  Administered 2013-10-11: 0.5 mL via INTRAMUSCULAR
  Filled 2013-10-10: qty 0.5

## 2013-10-10 MED ORDER — BRIMONIDINE TARTRATE 0.2 % OP SOLN
1.0000 [drp] | Freq: Two times a day (BID) | OPHTHALMIC | Status: DC
  Administered 2013-10-10 – 2013-10-11 (×2): 1 [drp] via OPHTHALMIC
  Filled 2013-10-10: qty 5

## 2013-10-10 MED ORDER — ASPIRIN EC 81 MG PO TBEC
81.0000 mg | DELAYED_RELEASE_TABLET | Freq: Every day | ORAL | Status: DC
Start: 2013-10-11 — End: 2013-10-11
  Administered 2013-10-11: 81 mg via ORAL
  Filled 2013-10-10: qty 1

## 2013-10-10 MED ORDER — BRIMONIDINE TARTRATE-TIMOLOL 0.2-0.5 % OP SOLN: 1.0000 [drp] | Freq: Two times a day (BID) | OPHTHALMIC | Status: DC

## 2013-10-10 MED ORDER — ONDANSETRON HCL 4 MG/2ML IJ SOLN: 4.0000 mg | Freq: Four times a day (QID) | INTRAMUSCULAR | Status: DC | PRN

## 2013-10-10 MED ORDER — ENOXAPARIN SODIUM 40 MG/0.4ML ~~LOC~~ SOLN
40.0000 mg | SUBCUTANEOUS | Status: DC
  Administered 2013-10-10: 40 mg via SUBCUTANEOUS
  Filled 2013-10-10 (×2): qty 0.4

## 2013-10-10 NOTE — ED Provider Notes (Signed)
CSN: [id number]     Arrival date & time 10/10/13  1614 History   First MD Initiated Contact with Patient 10/10/13 1623     Chief Complaint  Patient presents with  . Chest Pain     (Consider location/radiation/quality/duration/timing/severity/associated sxs/prior Treatment) Patient is a 80 y.o. female presenting with chest pain. The history is provided by the patient and the spouse.  Chest Pain Associated symptoms: back pain and diaphoresis   Associated symptoms: no abdominal pain, no fever, no headache, no nausea, no shortness of breath and not vomiting    patient feeling fine and to around 12:30 today. Developed significant pain in the left forearm 10 out of 10. Patient took 2 adult aspirin at home the pain came down to 3/10. Not associated with chest pain associated with neck pain associated with shortness of breath or nausea and vomiting. Was associated with diaphoresis. In route here patient developed some left scapular pain. But again no anterior chest pain. Patient's past medical history listed as negative but based on her medications one would suspect that she has a history of hypertension. Followed by Dr. Wilson Singer. No history of any cardiac problems. The arm pain was not made the better or worse by anything. Was described as a sharp pain.     Marland Kitchen No past medical history on file. Past Surgical History  Procedure Laterality Date  . Breast surgery     No family history on file. History  Substance Use Topics  . Smoking status: Not on file  . Smokeless tobacco: Not on file  . Alcohol Use: Not on file   OB History   Grav Para Term Preterm Abortions TAB SAB Ect Mult Living                 Review of Systems  Constitutional: Positive for diaphoresis. Negative for fever.  HENT: Negative for congestion.   Eyes: Positive for redness.  Respiratory: Negative for shortness of breath.   Cardiovascular: Positive for chest pain.  Gastrointestinal: Negative for nausea, vomiting and  abdominal pain.  Genitourinary: Negative for dysuria.  Musculoskeletal: Positive for back pain. Negative for neck pain.  Skin: Negative for rash.  Neurological: Negative for headaches.  Hematological: Does not bruise/bleed easily.  Psychiatric/Behavioral: Negative for confusion.      Allergies  Penicillins  Home Medications   Prior to Admission medications   Medication Sig Start Date End Date Taking? Authorizing Provider  Ascorbic Acid (VITAMIN C) 100 MG tablet Take 100 mg by mouth daily.   Yes Historical Provider, MD  aspirin 81 MG tablet Take 81 mg by mouth daily.   Yes Historical Provider, MD  atenolol (TENORMIN) 25 MG tablet Take 25 mg by mouth 2 (two) times daily.   Yes Historical Provider, MD  bimatoprost (LUMIGAN) 0.01 % SOLN Place 1 drop into both eyes at bedtime.   Yes Historical Provider, MD  brimonidine-timolol (COMBIGAN) 0.2-0.5 % ophthalmic solution Place 1 drop into both eyes every 12 (twelve) hours.   Yes Historical Provider, MD  cholecalciferol (VITAMIN D) 1000 UNITS tablet Take 1,000 Units by mouth daily.   Yes Historical Provider, MD  Fish Oil-Cholecalciferol (FISH OIL + D3 PO) Take 1,000 mg by mouth daily.   Yes Historical Provider, MD  Misc Natural Products ([employer] GLUCOS-CHONDROIT-MSM DS) TABS Take 1 tablet by mouth daily.   Yes Historical Provider, MD  vitamin E 100 UNIT capsule Take 100 Units by mouth daily.   Yes Historical Provider, MD  zolpidem (AMBIEN) 10 MG tablet  Take 5 mg by mouth at bedtime as needed for sleep.   Yes Historical Provider, MD   BP 154/59  Pulse 53  Temp(Src) 98.5 F (36.9 C) (Oral)  Resp 18  SpO2 96% Physical Exam  Nursing note and vitals reviewed. Constitutional: She is oriented to person, place, and time. She appears well-developed and well-nourished. No distress.  HENT:  Head: Normocephalic and atraumatic.  Mouth/Throat: Oropharynx is clear and moist.  Eyes: Conjunctivae and EOM are normal. Pupils are equal, round, and reactive to  light.  Neck: Normal range of motion.  Cardiovascular: Normal rate, regular rhythm and normal heart sounds.   No murmur heard. Pulmonary/Chest: Effort normal and breath sounds normal. No respiratory distress.  Abdominal: Soft. Bowel sounds are normal. There is no tenderness.  Musculoskeletal: Normal range of motion.  Slight swelling to the left ankle area patient states that that is normal because she had an injury to that ankle in the past.  Neurological: She is alert and oriented to person, place, and time. No cranial nerve deficit. She exhibits normal muscle tone. Coordination normal.  Skin: Skin is warm. No rash noted.    ED Course  Procedures (including critical care time) Labs Review Labs Reviewed  BASIC METABOLIC PANEL - Abnormal; Notable for the following:    Glucose, Bld 114 (*)    GFR calc non Af Amer 64 (*)    GFR calc Af Amer 74 (*)    All other components within normal limits  CBC  URINALYSIS, ROUTINE W REFLEX MICROSCOPIC  I-STAT TROPOININ, ED   Results for orders placed during the hospital encounter of 10/10/13  CBC      Result Value Ref Range   WBC 5.4  4.0 - 10.5 K/uL   RBC 4.51  3.87 - 5.11 MIL/uL   Hemoglobin 13.5  12.0 - 15.0 g/dL   HCT 40.4  36.0 - 46.0 %   MCV 89.6  78.0 - 100.0 fL   MCH 29.9  26.0 - 34.0 pg   MCHC 33.4  30.0 - 36.0 g/dL   RDW 13.5  11.5 - 15.5 %   Platelets 250  150 - 400 K/uL  BASIC METABOLIC PANEL      Result Value Ref Range   Sodium 139  137 - 147 mEq/L   Potassium 4.3  3.7 - 5.3 mEq/L   Chloride 103  96 - 112 mEq/L   CO2 25  19 - 32 mEq/L   Glucose, Bld 114 (*) 70 - 99 mg/dL   BUN 17  6 - 23 mg/dL   Creatinine, Ser 0.86  0.50 - 1.10 mg/dL   Calcium 9.1  8.4 - 10.5 mg/dL   GFR calc non Af Amer 64 (*) >90 mL/min   GFR calc Af Amer 74 (*) >90 mL/min   Anion gap 11  5 - 15  I-STAT TROPOININ, ED      Result Value Ref Range   Troponin i, poc 0.00  0.00 - 0.08 ng/mL   Comment 3              Imaging Review Dg Chest 2  View  10/10/2013   CLINICAL DATA:  Chest pain and left arm pain.  EXAM: CHEST  2 VIEW  COMPARISON:  06/06/2009.  FINDINGS: The heart size and mediastinal contours are within normal limits. Both lungs are clear. Spondylosis noted within the thoracic spine.  IMPRESSION: No active cardiopulmonary disease.   Electronically Signed   By: Queen Slough.D.  On: 10/10/2013 17:54     EKG Interpretation   Date/Time:  Wednesday October 10 2013 16:15:09 EDT Ventricular Rate:  52 PR Interval:  252 QRS Duration: 94 QT Interval:  434 QTC Calculation: 403 R Axis:   -3 Text Interpretation:  Sinus bradycardia with 1st degree A-V block Minimal  voltage criteria for LVH, may be normal variant Cannot rule out Anterior  infarct , age undetermined Abnormal ECG No significant change since last  tracing But with changes in R wave progression over ant lat leads  Confirmed by Jaretssi Kraker  MD, Emberley Kral [phone]) on 10/10/2013 4:51:12 PM      MDM   Final diagnoses:  Chest pain, unspecified chest pain type    The patient with onset of atypical pain left forearm at about 12:30. It was 10 out of 10 upon arrival here was 3/10 patient did take aspirin at home for this. Took 2 adult aspirins. While here in the emergency department patient started with left-sided chest pain that occurred right around the 1800. That pain was 8-10 out of 10. Repeat EKG showed no acute changes. Patient's initial troponin was negative with this chest pain patient given sublingual nitroglycerin. Patient's chest x-rays negative for any acute findings like pneumonia or permanent edema. Patient will require admission for cardiac rule out.  Patient's past medical history says nothing other than the breast surgery however based on her medications one would anticipate that she probably has a history of hypertension. Blood pressures here are a little bit elevated systolic 867/67. Discussed with family practice temporary admit orders completed for them.  Patient after receiving one sublingual nitroglycerin chest pain came down to a 4/10. Will continue nitroglycerin continue to reassess for chest pain.    Fredia Sorrow, MD 10/10/13 [phone]

## 2013-10-10 NOTE — ED Notes (Signed)
Attempted to give report 

## 2013-10-10 NOTE — ED Notes (Signed)
Flow called to inquire about pt's bed request.  

## 2013-10-10 NOTE — ED Notes (Signed)
Left arm numbness/pain radiating to left side of back.  Took x 2 asa ~ 1540.

## 2013-10-10 NOTE — H&P (Signed)
[facility] Admission History and Physical Service Pager: [phone]  Patient name: Amy Tapia Medical record number: [id number] Date of birth: [birth year] Age: 80 y.o. Gender: female  Primary Care Provider: No primary provider on file. Consultants: Cardiology Code Status: Full  Chief Complaint: Chest Pain  Assessment and Plan: Amy Tapia is a 80 y.o. female presenting with arm and chest pain. PMH significant for HTN.  #Arm/Chest/Scapula Pain. Atypical pain. Risk factors for cardiac etiology include age, HTN, and probable HLD. Current heart score of 4. Negative istat troponin and initial EKG. CXR clear. Took ASA 325mg  x2 at home. Unclear etiology at this time. Doubt ACS given story. HEART score points largely from age. Potential previous stress test many years ago with blunt trauma injury but otherwise no other prior cardiac w/up. Bradycardic on exam (s/p atenolol) but otherwise appears well - troponin x3 - f/u AM EKG - f/u A1C, TSH, lipid panel - continue home ASA 81mg  daily - Can consider imaging of left arm/shoulder, however patient without neurologic/motor deficits and no history of trauma  #HTN. On atenolol at home. - Holding atenolol at home 2/2 bradycardia in ED. - Will add back as needed potentially add coreg 3.125 tomorrow morning   #HLD. Patient not currently on statin, however stated that her PCP has tried prescribing to her in the past.  -f/u lipid panel  FEN/GI: Heart Healthy/Carb Modified Diet Prophylaxis: Lovenox  Disposition: Admit to telemetry under attending Dr Nori Riis.  History of Present Illness: Amy Tapia is a 80 y.o. female presenting with arm pain. Patient reports developing sudden onset left forearm pain around 12:30pm today. She took some aspirin (2X325mg )which moderately relieved her pain. Pain described as intermittent with some radiation to the left scapula. Pain continued to worsen so the patient presented to the ED.  Additionally had some diaphoresis but no n/v. Denies any recent exertion or trauma. No weakness, numbness, or paresthesias.   In the ED, patient developed CP. Described as dull pain in her left chest which was temporarily relieved by nitro X2 and morphine. No ripping sensation. No radiation of chest pain into back. Pain continues to be intermittent.   Has never had similar pain in the past. Has had previous hospital stay for dizziness. Patient took atenolol this morning.   Review Of Systems: Per HPI, otherwise 12 point review of systems was performed and was unremarkable.  Patient Active Problem List   Diagnosis Date Noted  . Chest pain 10/10/2013   Past Medical History: -HTN -Hyperlipidemia -Breast cancer  Past Surgical History: Past Surgical History  Procedure Laterality Date  . Breast surgery    radical masectomy- stage 4 Hysterectomy   Social History: History  Substance Use Topics  . Smoking status: Never Smoker   . Smokeless tobacco: Not on file  . Alcohol Use: No   Additional social history: denies X3 Please also refer to relevant sections of EMR.  Family History: No family history on file. Allergies and Medications: Allergies  Allergen Reactions  . Penicillins Shortness Of Breath   No current facility-administered medications on file prior to encounter.   No current outpatient prescriptions on file prior to encounter.    Objective: BP 197/65  Pulse 59  Temp(Src) 97.6 F (36.4 C) (Oral)  Resp 19  Ht 5\' 8"  (1.727 m)  Wt 174 lb (78.926 kg)  BMI 26.46 kg/m2  SpO2 95% Exam: General: Elderly woman lying in bed in NAD HEENT: EOMI, MMM, some bilateral sclera injection noted  Cardiovascular: Bradycardic, regular rate. Distal pulses 2+ bilaterally, no carotid bruits Respiratory: NWOB, CTAB Abdomen: +BS, soft, NT, ND Extremities: Left forearm without deformity, nontender to palpation. Strength in upper extremities 5/5 and symmetric bilaterally. Neg empty can,  neg speeds test; nml apley scratch. Neg neers and hawkins. No palpable step offs. Sensation to touch grossly intact bilaterally. Skin: Warm, Dry Neuro: Alert and oriented. No focal deficits.   Labs and Imaging: CBC BMET   Recent Labs Lab 10/10/13 1645  WBC 5.4  HGB 13.5  HCT 40.4  PLT 250    Recent Labs Lab 10/10/13 1645  NA 139  K 4.3  CL 103  CO2 25  BUN 17  CREATININE 0.86  GLUCOSE 114*  CALCIUM 9.1     istat troponin: 0.00  EKG: sinus bradycardia prolonged PR, no acute ischemic changes  Dg Chest 2 View 10/10/2013    IMPRESSION: No active cardiopulmonary disease.      Dimas Chyle, MD 10/10/2013, 8:20 PM PGY-1, [place] Intern pager: [phone], text pages welcome  Pt seen and examined by myself and excellent pgy-1 Dimas Chyle. Plan reflects my decision making. Any additional changes are seen in BLUE  Bernadene Bell, MD Family Medicine PGY-2 Please page or call with questions

## 2013-10-11 ENCOUNTER — Observation Stay (HOSPITAL_COMMUNITY): Payer: Medicare HMO

## 2013-10-11 DIAGNOSIS — Z79899 Other long term (current) drug therapy: Secondary | ICD-10-CM | POA: Diagnosis not present

## 2013-10-11 DIAGNOSIS — Z87828 Personal history of other (healed) physical injury and trauma: Secondary | ICD-10-CM | POA: Diagnosis not present

## 2013-10-11 DIAGNOSIS — R079 Chest pain, unspecified: Principal | ICD-10-CM

## 2013-10-11 DIAGNOSIS — Z7982 Long term (current) use of aspirin: Secondary | ICD-10-CM | POA: Diagnosis not present

## 2013-10-11 DIAGNOSIS — M25532 Pain in left wrist: Secondary | ICD-10-CM | POA: Diagnosis not present

## 2013-10-11 DIAGNOSIS — M549 Dorsalgia, unspecified: Secondary | ICD-10-CM | POA: Diagnosis not present

## 2013-10-11 DIAGNOSIS — Z23 Encounter for immunization: Secondary | ICD-10-CM | POA: Diagnosis not present

## 2013-10-11 DIAGNOSIS — R61 Generalized hyperhidrosis: Secondary | ICD-10-CM | POA: Diagnosis not present

## 2013-10-11 DIAGNOSIS — M79602 Pain in left arm: Secondary | ICD-10-CM | POA: Diagnosis present

## 2013-10-11 DIAGNOSIS — Z880 Allergy status to penicillin: Secondary | ICD-10-CM | POA: Diagnosis not present

## 2013-10-11 LAB — HEMOGLOBIN A1C
Hgb A1c MFr Bld: 5.9 % — ABNORMAL HIGH (ref <5.7)
MEAN PLASMA GLUCOSE: 123 mg/dL — AB (ref <117)

## 2013-10-11 LAB — LIPID PANEL
Cholesterol: 171 mg/dL (ref 0–200)
HDL: 54 mg/dL (ref >39)
LDL Cholesterol: 95 mg/dL (ref 0–99)
Total CHOL/HDL Ratio: 3.2 RATIO
Triglycerides: 111 mg/dL (ref <150)
VLDL: 22 mg/dL (ref 0–40)

## 2013-10-11 LAB — TSH: TSH: 7.11 u[IU]/mL — AB (ref 0.350–4.500)

## 2013-10-11 LAB — TROPONIN I: Troponin I: <0.3 ng/mL (ref <0.30)

## 2013-10-11 MED ORDER — PRAVASTATIN SODIUM 40 MG PO TABS: 40.0000 mg | ORAL_TABLET | Freq: Every day | ORAL | Status: DC

## 2013-10-11 NOTE — Progress Notes (Signed)
UR completed 

## 2013-10-11 NOTE — Progress Notes (Signed)
Pt discharged home with family.  Reviewed discharge instructions and education, all questions answered.  Assessment unchanged from earlier.  

## 2013-10-11 NOTE — H&P (Signed)
Call Pager [phone] for any questions or notifications regarding this patient  FMTS Attending Admission Note: Amy Mcmurray MD Attending pager:[phone] I  have seen and examined this patient, reviewed their chart. I have discussed this patient with the resident. I agree with the resident's findings, assessment and care plan.Having nio pain now--I palpated her entire arm on both sides---agree with workup.

## 2013-10-11 NOTE — Discharge Summary (Signed)
Discussed in rounds and agree with discharge.  Agree with the documentation and management of Dr. Jacinto Reap.

## 2013-10-11 NOTE — Consult Note (Addendum)
CARDIOLOGY CONSULT NOTE   Patient ID: Amy Tapia MRN: [id number] DOB/AGE: 1933-05-28 80 y.o.  Admit Date: 10/10/2013  Primary Physician: No primary provider on file.  Primary Cardiologist   Rodney Langton   Clinical Summary Ms. Aure is a 80 y.o.female . She has been admitted with left arm pain. She described this as 8/10 pain. Later while in the emergency room there was some chest discomfort. There was question of slight response to nitroglycerin.. She has risk factors for coronary disease of age and hypertension by history. Since being here her EKGs have shown no significant change. Her troponins are normal. Her rhythm has been normal. She says the pain meds help her left arm pain.   Allergies  Allergen Reactions  . Penicillins Shortness Of Breath    Medications Scheduled Medications: . aspirin EC  81 mg Oral Daily  . timolol  1 drop Both Eyes BID   And  . brimonidine  1 drop Both Eyes BID  . enoxaparin (LOVENOX) injection  40 mg Subcutaneous Q24H  . Influenza vac split quadrivalent PF  0.5 mL Intramuscular Tomorrow-1000  . latanoprost  1 drop Both Eyes QHS     Infusions:     PRN Medications:  acetaminophen, HYDROcodone-acetaminophen, ondansetron (ZOFRAN) IV   Past Medical History  Diagnosis Date  . Hypertension     Past Surgical History  Procedure Laterality Date  . Breast surgery    . Abdominal hysterectomy    . Tonsillectomy      Family history:    There is no significant family history of coronary disease.  Social History Ms. Mcshea reports that she has never smoked. She does not have any smokeless tobacco history on file. Ms. Courtois reports that she does not drink alcohol.  Review of Systems Patient denies fever, chills, headache, sweats, rash, change in vision, change in hearing, , cough, nausea vomiting, urinary symptoms. All other systems are reviewed and are negative other than the history of present illness.   Physical  Examination Blood pressure 134/47, pulse 57, temperature 98.3 F (36.8 C), temperature source Oral, resp. rate 18, height 5\' 8"  (1.727 m), weight 177 lb 6.4 oz (80.468 kg), SpO2 94.00%.  Intake/Output Summary (Last 24 hours) at 10/11/13 0815 Last data filed at 10/11/13 0327  Gross per 24 hour  Intake    360 ml  Output      0 ml  Net    360 ml   The patient is oriented to person time and place. Affect is normal. Head is atraumatic. Sclera and conjunctiva are normal. There is no jugulovenous distention. Lungs are clear. Respiratory effort is nonlabored. The patient is lying flat in bed. Cardiac exam reveals S1 and S2. Abdomen is soft. There is no peripheral edema. There no musculoskeletal deformities. There are no skin rashes. She has mild discomfort in her left lower arm. It is worse when she is not receiving her pain meds.   Prior Cardiac Testing/Procedures  Lab Results  Basic Metabolic Panel:  Recent Labs Lab 10/10/13 1645  NA 139  K 4.3  CL 103  CO2 25  GLUCOSE 114*  BUN 17  CREATININE 0.86  CALCIUM 9.1    Liver Function Tests: No results found for this basename: AST, ALT, ALKPHOS, BILITOT, PROT, ALBUMIN,  in the last 168 hours  CBC:  Recent Labs Lab 10/10/13 1645  WBC 5.4  HGB 13.5  HCT 40.4  MCV 89.6  PLT 250    Cardiac  Enzymes:  Recent Labs Lab 10/10/13 2225 10/11/13 0551  TROPONINI <0.30 <0.30    BNP: No components found with this basename: POCBNP,    Radiology: Dg Chest 2 View  10/10/2013   CLINICAL DATA:  Chest pain and left arm pain.  EXAM: CHEST  2 VIEW  COMPARISON:  06/06/2009.  FINDINGS: The heart size and mediastinal contours are within normal limits. Both lungs are clear. Spondylosis noted within the thoracic spine.  IMPRESSION: No active cardiopulmonary disease.   Electronically Signed   By: Kerby Moors M.D.   On: 10/10/2013 17:54   Dg Forearm Left  10/11/2013   CLINICAL DATA:  Left forearm pain, no known injury  EXAM: LEFT FOREARM -  2 VIEW  COMPARISON:  None.  FINDINGS: No fracture or dislocation is seen.  The joint spaces are preserved.  The visualized soft tissues are unremarkable.  IMPRESSION: No acute osseous abnormality is seen.   Electronically Signed   By: Julian Hy M.D.   On: 10/11/2013 01:03     ECG: I have reviewed the current EKGs. There is no significant abnormality.  Telemetry:   I have reviewed telemetry. There is normal sinus rhythm.   Impression and Recommendations    Chest pain      The patient initially presented with arm pain. In the emergency room there was question of chest pain. There was question of slight response to nitroglycerin. EKGs have shown no change. Troponins are normal. At this point there is no definite evidence of a cardiac abnormality. I would recommend complete evaluation of her arm pain.    Left arm pain     I feel that her arm pain is not cardiac in origin.  She may have a neurologic basis for this pain. I would  suggest evaluating this completely. If there is no obvious etiology found, more complete cardiac workup can be considered. However I feel it is most prudent to start with the evaluation of the arm pain.  Daryel November, MD 10/11/2013, 8:15 AM

## 2013-10-11 NOTE — Discharge Summary (Signed)
[facility] Discharge Summary  Patient name: Amy Tapia Medical record number: [id number] Date of birth: 11-May-1933 Age: 80 y.o. Gender: female Date of Admission: 10/10/2013  Date of Discharge: 10/11/13 Admitting Physician: Dickie La, MD  Primary Care Provider: No primary provider on file. - Dr. Wilson Singer Consultants: Cardiology  Indication for Hospitalization: Chest pain  Discharge Diagnoses/Problem List:  Chest Pain L forearm pain HTN HLD  Disposition: Home  Discharge Condition: Stable  Discharge Exam: General: Elderly woman lying in bed in NAD  HEENT: EOMI, MMM, PERRL CV: Bradycardic, regular rate. Distal pulses 2+ bilaterally, no carotid bruits  Respiratory: Normal WOB, CTAB  Abdomen: +BS, soft, NT, ND  Extremities: Left forearm without deformity, nontender to palpation. Strength in upper extremities 5/5 and symmetric bilaterally. Sensation to touch grossly intact bilaterally. Tennels and Phalens negative. Skin: Warm, Dry, intact Neuro: Alert and oriented. No focal deficits.   Brief Hospital Course:   CHATTIE Tapia is a 80 y.o. female presenting with L forearm and chest pain. PMH significant for HTN, HLD.   Chest Pain/HLD:   Patient was admitted for ACS work-up given episode of atypical chest pain in the ED.  CXR clear, troponins negative x3, EKG non-ischemic x2 and unchanged.   Cardiology was consulted and agreed that chest pain was unlikely to be cardiac in origin. Home aspirin 81mg  continued. Risk stratification labs, including A1c, lipid panel, and TSH obtained on admission.  TSH elevated, but unreliable in acute setting.  Calculated ASCVD risk of 27.7%, started Pravastatin 40mg  daily, given h/o myalgias on previous statin (unsure of which one).  No further cardiac workup warranted at this time.  L Forearm pain:  Patient's initially presenting complaint was left forearm pain.  This resolved after 2 doses of Norco 5-325.  She was pain free for 9  hours before discharge.  All provocative testing for nerve impingement were negative.  Strength and sensation intact.  Unclear etiology, though presumably MSK.  Patient advised to take Tylenol as needed for pain and f/u with PCP if recurs.  HTN. Home atenolol was held throughout admission and at discharge 2/2 bradycardia.  BP was still well controlled throughout admission off of all antihypertensives.     Issues for Follow Up:  1. HTN: Holding atenolol at discharge 2/2 bradycardia.  BP controlled even with held atenolol.  Can consider resuming at f/u if HR can tolerate.  2. HLD: Calculated ASCVD Risk 27.7%. Started Pravastatin 40mg , as patient reported myalgias with previous statin use, unsure of which statin.  Monitor for SEs and adherence.  3. TSH elevated to 7.110, but unreliable in acute setting.  Consider repeating in 3-4 weeks as outpt.  4. Hgb A1c pending at time of discharge - f/u on this.  5. L forearm pain - XRay normal, no sign of carpal tunnel syndrome, strength and sensation intact.  Consider further w/u if recurs.   Significant Procedures: None  Significant Labs and Imaging:   Recent Labs Lab 10/10/13 1645  WBC 5.4  HGB 13.5  HCT 40.4  PLT 250    Recent Labs Lab 10/10/13 1645  NA 139  K 4.3  CL 103  CO2 25  GLUCOSE 114*  BUN 17  CREATININE 0.86  CALCIUM 9.1    Troponins neg x3  TSH 7.110  Lipid Panel     Component Value Date/Time   CHOL 171 10/11/2013 0551   TRIG 111 10/11/2013 0551   HDL 54 10/11/2013 0551   CHOLHDL 3.2 10/11/2013  0551   VLDL 22 10/11/2013 0551   LDLCALC 95 10/11/2013 0551    Urinalysis    Component Value Date/Time   COLORURINE YELLOW 10/10/2013 1813   APPEARANCEUR CLOUDY* 10/10/2013 1813   LABSPEC 1.004* 10/10/2013 1813   PHURINE 5.0 10/10/2013 1813   GLUCOSEU NEGATIVE 10/10/2013 1813   HGBUR NEGATIVE 10/10/2013 1813   BILIRUBINUR NEGATIVE 10/10/2013 1813   KETONESUR NEGATIVE 10/10/2013 1813   PROTEINUR NEGATIVE 10/10/2013 1813    UROBILINOGEN 0.2 10/10/2013 1813   NITRITE NEGATIVE 10/10/2013 1813   LEUKOCYTESUR NEGATIVE 10/10/2013 1813    EKG: sinus bradycardia prolonged PR, no acute ischemic changes  Repeat EKG (10/1): Sinus bradycardia, 1st degree AV block (PR 260 -> 294), no acute ischemic changes.  Dg Chest 2 View 10/10/2013  IMPRESSION: No active cardiopulmonary disease.  Left forearm XRay (10/1): No acute osseous abnormality is seen.    Results/Tests Pending at Time of Discharge: Hgb A1c  Discharge Medications:    Medication List    STOP taking these medications       atenolol 25 MG tablet  Commonly known as:  TENORMIN      TAKE these medications       aspirin 81 MG tablet  Take 81 mg by mouth daily.     bimatoprost 0.01 % Soln  Commonly known as:  LUMIGAN  Place 1 drop into both eyes at bedtime.     cholecalciferol 1000 UNITS tablet  Commonly known as:  VITAMIN D  Take 1,000 Units by mouth daily.     COMBIGAN 0.2-0.5 % ophthalmic solution  Generic drug:  brimonidine-timolol  Place 1 drop into both eyes every 12 (twelve) hours.     [employer] GLUCOS-CHONDROIT-MSM DS Tabs  Take 1 tablet by mouth daily.     FISH OIL + D3 PO  Take 1,000 mg by mouth daily.     pravastatin 40 MG tablet  Commonly known as:  PRAVACHOL  Take 1 tablet (40 mg total) by mouth daily.     vitamin C 100 MG tablet  Take 100 mg by mouth daily.     vitamin E 100 UNIT capsule  Take 100 Units by mouth daily.     zolpidem 10 MG tablet  Commonly known as:  AMBIEN  Take 5 mg by mouth at bedtime as needed for sleep.        Discharge Instructions: Please refer to Patient Instructions section of EMR for full details.  Patient was counseled important signs and symptoms that should prompt return to medical care, changes in medications, dietary instructions, activity restrictions, and follow up appointments.   Follow-Up Appointments: Follow-up Information   Follow up with Your PCP. Call in 3 days. (Call to make a follow-up  appt with your PCP (Dr. Wilson Singer).)       Lavon Paganini, MD 10/11/2013, 2:12 PM PGY-1, [place]

## 2013-10-11 NOTE — Discharge Instructions (Signed)
You were admitted for chest pain and arm pain.  These have resolved.  Your heart testing was all negative.  It is unclear what is causing your arm pain.  You can take tylenol if this recurs and talk to your PCP about it.  Chest Pain (Nonspecific) It is often hard to give a specific diagnosis for the cause of chest pain. There is always a chance that your pain could be related to something serious, such as a heart attack or a blood clot in the lungs. You need to follow up with your health care provider for further evaluation. CAUSES   Heartburn.  Pneumonia or bronchitis.  Anxiety or stress.  Inflammation around your heart (pericarditis) or lung (pleuritis or pleurisy).  A blood clot in the lung.  A collapsed lung (pneumothorax). It can develop suddenly on its own (spontaneous pneumothorax) or from trauma to the chest.  Shingles infection (herpes zoster virus). The chest wall is composed of bones, muscles, and cartilage. Any of these can be the source of the pain.  The bones can be bruised by injury.  The muscles or cartilage can be strained by coughing or overwork.  The cartilage can be affected by inflammation and become sore (costochondritis). DIAGNOSIS  Lab tests or other studies may be needed to find the cause of your pain. Your health care provider may have you take a test called an ambulatory electrocardiogram (ECG). An ECG records your heartbeat patterns over a 24-hour period. You may also have other tests, such as:  Transthoracic echocardiogram (TTE). During echocardiography, sound waves are used to evaluate how blood flows through your heart.  Transesophageal echocardiogram (TEE).  Cardiac monitoring. This allows your health care provider to monitor your heart rate and rhythm in real time.  Holter monitor. This is a portable device that records your heartbeat and can help diagnose heart arrhythmias. It allows your health care provider to track your heart activity for  several days, if needed.  Stress tests by exercise or by giving medicine that makes the heart beat faster. TREATMENT   Treatment depends on what may be causing your chest pain. Treatment may include:  Acid blockers for heartburn.  Anti-inflammatory medicine.  Pain medicine for inflammatory conditions.  Antibiotics if an infection is present.  You may be advised to change lifestyle habits. This includes stopping smoking and avoiding alcohol, caffeine, and chocolate.  You may be advised to keep your head raised (elevated) when sleeping. This reduces the chance of acid going backward from your stomach into your esophagus. Most of the time, nonspecific chest pain will improve within 2-3 days with rest and mild pain medicine.  HOME CARE INSTRUCTIONS   If antibiotics were prescribed, take them as directed. Finish them even if you start to feel better.  For the next few days, avoid physical activities that bring on chest pain. Continue physical activities as directed.  Do not use any tobacco products, including cigarettes, chewing tobacco, or electronic cigarettes.  Avoid drinking alcohol.  Only take medicine as directed by your health care provider.  Follow your health care provider's suggestions for further testing if your chest pain does not go away.  Keep any follow-up appointments you made. If you do not go to an appointment, you could develop lasting (chronic) problems with pain. If there is any problem keeping an appointment, call to reschedule. SEEK MEDICAL CARE IF:   Your chest pain does not go away, even after treatment.  You have a rash with blisters on  your chest.  You have a fever. SEEK IMMEDIATE MEDICAL CARE IF:   You have increased chest pain or pain that spreads to your arm, neck, jaw, back, or abdomen.  You have shortness of breath.  You have an increasing cough, or you cough up blood.  You have severe back or abdominal pain.  You feel nauseous or  vomit.  You have severe weakness.  You faint.  You have chills. This is an emergency. Do not wait to see if the pain will go away. Get medical help at once. Call your local emergency services (911 in [place] Do not drive yourself to the hospital. MAKE SURE YOU:   Understand these instructions.  Will watch your condition.  Will get help right away if you are not doing well or get worse. Document Released: 10/07/2004 Document Revised: 01/02/2013 Document Reviewed: 08/03/2007 [facility] Patient Information 2015 [place], [place]. This information is not intended to replace advice given to you by your health care provider. Make sure you discuss any questions you have with your health care provider.

## 2014-01-23 ENCOUNTER — Other Ambulatory Visit: Payer: Self-pay

## 2014-01-23 DIAGNOSIS — Z1231 Encounter for screening mammogram for malignant neoplasm of breast: Secondary | ICD-10-CM

## 2014-01-23 DIAGNOSIS — Z9011 Acquired absence of right breast and nipple: Secondary | ICD-10-CM

## 2014-02-21 ENCOUNTER — Ambulatory Visit: Payer: Medicare HMO

## 2014-02-25 ENCOUNTER — Ambulatory Visit: Payer: Medicare HMO

## 2014-03-01 ENCOUNTER — Ambulatory Visit
Admission: RE | Admit: 2014-03-01 | Discharge: 2014-03-01 | Disposition: A | Discharge status: Home / Self Care | Payer: PPO | Source: Ambulatory Visit

## 2014-03-01 DIAGNOSIS — Z1231 Encounter for screening mammogram for malignant neoplasm of breast: Secondary | ICD-10-CM

## 2014-03-01 DIAGNOSIS — Z9011 Acquired absence of right breast and nipple: Secondary | ICD-10-CM

## 2014-05-01 ENCOUNTER — Encounter (HOSPITAL_COMMUNITY): Payer: Self-pay | Admitting: Family Medicine

## 2014-05-01 ENCOUNTER — Observation Stay (HOSPITAL_COMMUNITY)
Admission: EM | Admit: 2014-05-01 | Discharge: 2014-05-03 | Disposition: A | Discharge status: Home / Self Care | Payer: PPO | Attending: Internal Medicine | Admitting: Internal Medicine

## 2014-05-01 ENCOUNTER — Emergency Department (HOSPITAL_COMMUNITY): Payer: PPO

## 2014-05-01 DIAGNOSIS — Z853 Personal history of malignant neoplasm of breast: Secondary | ICD-10-CM | POA: Insufficient documentation

## 2014-05-01 DIAGNOSIS — Z79899 Other long term (current) drug therapy: Secondary | ICD-10-CM | POA: Diagnosis not present

## 2014-05-01 DIAGNOSIS — I459 Conduction disorder, unspecified: Secondary | ICD-10-CM | POA: Insufficient documentation

## 2014-05-01 DIAGNOSIS — J029 Acute pharyngitis, unspecified: Secondary | ICD-10-CM | POA: Insufficient documentation

## 2014-05-01 DIAGNOSIS — I082 Rheumatic disorders of both aortic and tricuspid valves: Secondary | ICD-10-CM | POA: Insufficient documentation

## 2014-05-01 DIAGNOSIS — E785 Hyperlipidemia, unspecified: Secondary | ICD-10-CM | POA: Diagnosis present

## 2014-05-01 DIAGNOSIS — Z9011 Acquired absence of right breast and nipple: Secondary | ICD-10-CM | POA: Diagnosis not present

## 2014-05-01 DIAGNOSIS — Z90710 Acquired absence of both cervix and uterus: Secondary | ICD-10-CM | POA: Insufficient documentation

## 2014-05-01 DIAGNOSIS — J209 Acute bronchitis, unspecified: Secondary | ICD-10-CM | POA: Insufficient documentation

## 2014-05-01 DIAGNOSIS — H409 Unspecified glaucoma: Secondary | ICD-10-CM | POA: Insufficient documentation

## 2014-05-01 DIAGNOSIS — E1169 Type 2 diabetes mellitus with other specified complication: Secondary | ICD-10-CM | POA: Diagnosis present

## 2014-05-01 DIAGNOSIS — R1013 Epigastric pain: Secondary | ICD-10-CM | POA: Diagnosis not present

## 2014-05-01 DIAGNOSIS — R072 Precordial pain: Secondary | ICD-10-CM | POA: Insufficient documentation

## 2014-05-01 DIAGNOSIS — R05 Cough: Secondary | ICD-10-CM | POA: Diagnosis present

## 2014-05-01 DIAGNOSIS — R059 Cough, unspecified: Secondary | ICD-10-CM | POA: Diagnosis present

## 2014-05-01 DIAGNOSIS — R0789 Other chest pain: Secondary | ICD-10-CM | POA: Diagnosis not present

## 2014-05-01 DIAGNOSIS — R110 Nausea: Secondary | ICD-10-CM | POA: Diagnosis present

## 2014-05-01 DIAGNOSIS — Z7982 Long term (current) use of aspirin: Secondary | ICD-10-CM | POA: Diagnosis not present

## 2014-05-01 DIAGNOSIS — E782 Mixed hyperlipidemia: Secondary | ICD-10-CM | POA: Diagnosis present

## 2014-05-01 DIAGNOSIS — R079 Chest pain, unspecified: Secondary | ICD-10-CM | POA: Diagnosis present

## 2014-05-01 DIAGNOSIS — I440 Atrioventricular block, first degree: Secondary | ICD-10-CM | POA: Diagnosis not present

## 2014-05-01 DIAGNOSIS — I5189 Other ill-defined heart diseases: Secondary | ICD-10-CM | POA: Insufficient documentation

## 2014-05-01 DIAGNOSIS — I10 Essential (primary) hypertension: Secondary | ICD-10-CM | POA: Diagnosis not present

## 2014-05-01 HISTORY — DX: Malignant neoplasm of unspecified site of right female breast: C50.911

## 2014-05-01 HISTORY — DX: Unspecified glaucoma: H40.9

## 2014-05-01 HISTORY — DX: Hyperlipidemia, unspecified: E78.5

## 2014-05-01 HISTORY — DX: Unspecified osteoarthritis, unspecified site: M19.90

## 2014-05-01 LAB — COMPREHENSIVE METABOLIC PANEL
ALBUMIN: 3.9 g/dL (ref 3.5–5.2)
ALK PHOS: 63 U/L (ref 39–117)
ALT: 19 U/L (ref 0–35)
AST: 22 U/L (ref 0–37)
Anion gap: 10 (ref 5–15)
BILIRUBIN TOTAL: 0.5 mg/dL (ref 0.3–1.2)
BUN: 15 mg/dL (ref 6–23)
CHLORIDE: 106 mmol/L (ref 96–112)
CO2: 26 mmol/L (ref 19–32)
Calcium: 9.4 mg/dL (ref 8.4–10.5)
Creatinine, Ser: 0.77 mg/dL (ref 0.50–1.10)
GFR calc Af Amer: >90 mL/min (ref >90)
GFR calc non Af Amer: 78 mL/min — ABNORMAL LOW (ref >90)
Glucose, Bld: 113 mg/dL — ABNORMAL HIGH (ref 70–99)
POTASSIUM: 4.1 mmol/L (ref 3.5–5.1)
SODIUM: 142 mmol/L (ref 135–145)
Total Protein: 7.3 g/dL (ref 6.0–8.3)

## 2014-05-01 LAB — CBC WITH DIFFERENTIAL/PLATELET
Basophils Absolute: 0 10*3/uL (ref 0.0–0.1)
Basophils Relative: 1 % (ref 0–1)
Eosinophils Absolute: 0.3 10*3/uL (ref 0.0–0.7)
Eosinophils Relative: 5 % (ref 0–5)
HEMATOCRIT: 42.8 % (ref 36.0–46.0)
HEMOGLOBIN: 14.3 g/dL (ref 12.0–15.0)
LYMPHS PCT: 33 % (ref 12–46)
Lymphs Abs: 2.2 10*3/uL (ref 0.7–4.0)
MCH: 30 pg (ref 26.0–34.0)
MCHC: 33.4 g/dL (ref 30.0–36.0)
MCV: 89.7 fL (ref 78.0–100.0)
MONO ABS: 0.6 10*3/uL (ref 0.1–1.0)
MONOS PCT: 8 % (ref 3–12)
Neutro Abs: 3.7 10*3/uL (ref 1.7–7.7)
Neutrophils Relative %: 53 % (ref 43–77)
Platelets: 295 10*3/uL (ref 150–400)
RBC: 4.77 MIL/uL (ref 3.87–5.11)
RDW: 13.4 % (ref 11.5–15.5)
WBC: 6.9 10*3/uL (ref 4.0–10.5)

## 2014-05-01 LAB — I-STAT TROPONIN, ED: Troponin i, poc: 0.01 ng/mL (ref 0.00–0.08)

## 2014-05-01 LAB — LIPASE, BLOOD: Lipase: 35 U/L (ref 11–59)

## 2014-05-01 MED ORDER — ALUM & MAG HYDROXIDE-SIMETH 200-200-20 MG/5ML PO SUSP: 30.0000 mL | Freq: Four times a day (QID) | ORAL | Status: DC | PRN

## 2014-05-01 MED ORDER — VITAMIN C 500 MG PO TABS
500.0000 mg | ORAL_TABLET | Freq: Every day | ORAL | Status: DC
  Administered 2014-05-02 – 2014-05-03 (×2): 500 mg via ORAL
  Filled 2014-05-01 (×2): qty 1

## 2014-05-01 MED ORDER — CVS GLUCOS-CHONDROIT-MSM DS PO TABS: 1.0000 | ORAL_TABLET | Freq: Every day | ORAL | Status: DC

## 2014-05-01 MED ORDER — BRIMONIDINE TARTRATE 0.2 % OP SOLN
1.0000 [drp] | Freq: Two times a day (BID) | OPHTHALMIC | Status: DC
  Administered 2014-05-02 – 2014-05-03 (×4): 1 [drp] via OPHTHALMIC
  Filled 2014-05-01: qty 5

## 2014-05-01 MED ORDER — ACETAMINOPHEN 325 MG PO TABS
650.0000 mg | ORAL_TABLET | Freq: Four times a day (QID) | ORAL | Status: DC | PRN
  Administered 2014-05-02 (×2): 650 mg via ORAL
  Filled 2014-05-01 (×2): qty 2

## 2014-05-01 MED ORDER — BRIMONIDINE TARTRATE-TIMOLOL 0.2-0.5 % OP SOLN: 1.0000 [drp] | Freq: Two times a day (BID) | OPHTHALMIC | Status: DC

## 2014-05-01 MED ORDER — OMEGA-3-ACID ETHYL ESTERS 1 G PO CAPS
1.0000 g | ORAL_CAPSULE | Freq: Every day | ORAL | Status: DC
  Administered 2014-05-02 – 2014-05-03 (×2): 1 g via ORAL
  Filled 2014-05-01 (×2): qty 1

## 2014-05-01 MED ORDER — ALBUTEROL SULFATE (2.5 MG/3ML) 0.083% IN NEBU: 2.5000 mg | INHALATION_SOLUTION | RESPIRATORY_TRACT | Status: DC | PRN

## 2014-05-01 MED ORDER — TIMOLOL MALEATE 0.5 % OP SOLN
1.0000 [drp] | Freq: Two times a day (BID) | OPHTHALMIC | Status: DC
  Administered 2014-05-02 – 2014-05-03 (×4): 1 [drp] via OPHTHALMIC
  Filled 2014-05-01: qty 5

## 2014-05-01 MED ORDER — PRAVASTATIN SODIUM 40 MG PO TABS
40.0000 mg | ORAL_TABLET | Freq: Every day | ORAL | Status: DC
  Administered 2014-05-02: 40 mg via ORAL
  Filled 2014-05-01 (×2): qty 1

## 2014-05-01 MED ORDER — SODIUM CHLORIDE 0.9 % IV SOLN
INTRAVENOUS | Status: DC
  Administered 2014-05-02: via INTRAVENOUS

## 2014-05-01 MED ORDER — ONDANSETRON HCL 4 MG PO TABS: 4.0000 mg | ORAL_TABLET | Freq: Four times a day (QID) | ORAL | Status: DC | PRN

## 2014-05-01 MED ORDER — VITAMIN D3 25 MCG (1000 UNIT) PO TABS
1000.0000 [IU] | ORAL_TABLET | Freq: Every day | ORAL | Status: DC
  Administered 2014-05-02 – 2014-05-03 (×2): 1000 [IU] via ORAL
  Filled 2014-05-01 (×2): qty 1

## 2014-05-01 MED ORDER — ONDANSETRON HCL 4 MG/2ML IJ SOLN: 4.0000 mg | Freq: Four times a day (QID) | INTRAMUSCULAR | Status: DC | PRN

## 2014-05-01 MED ORDER — VITAMIN E 45 MG (100 UNIT) PO CAPS
100.0000 [IU] | ORAL_CAPSULE | Freq: Every day | ORAL | Status: DC
  Administered 2014-05-02 – 2014-05-03 (×2): 100 [IU] via ORAL
  Filled 2014-05-01 (×2): qty 1

## 2014-05-01 MED ORDER — ASPIRIN EC 81 MG PO TBEC
81.0000 mg | DELAYED_RELEASE_TABLET | Freq: Every day | ORAL | Status: DC
  Administered 2014-05-02 – 2014-05-03 (×2): 81 mg via ORAL
  Filled 2014-05-01 (×2): qty 1

## 2014-05-01 MED ORDER — LATANOPROST 0.005 % OP SOLN
1.0000 [drp] | Freq: Every day | OPHTHALMIC | Status: DC
  Administered 2014-05-02 (×2): 1 [drp] via OPHTHALMIC
  Filled 2014-05-01: qty 2.5

## 2014-05-01 MED ORDER — DM-GUAIFENESIN ER 30-600 MG PO TB12
1.0000 | ORAL_TABLET | Freq: Two times a day (BID) | ORAL | Status: DC
  Administered 2014-05-02 – 2014-05-03 (×4): 1 via ORAL
  Filled 2014-05-01 (×5): qty 1

## 2014-05-01 MED ORDER — ATENOLOL 25 MG PO TABS
25.0000 mg | ORAL_TABLET | Freq: Every day | ORAL | Status: DC
  Administered 2014-05-02 – 2014-05-03 (×2): 25 mg via ORAL
  Filled 2014-05-01 (×2): qty 1

## 2014-05-01 MED ORDER — MORPHINE SULFATE 2 MG/ML IJ SOLN: 2.0000 mg | INTRAMUSCULAR | Status: DC | PRN

## 2014-05-01 MED ORDER — ACETAMINOPHEN 650 MG RE SUPP: 650.0000 mg | Freq: Four times a day (QID) | RECTAL | Status: DC | PRN

## 2014-05-01 MED ORDER — HEPARIN SODIUM (PORCINE) 5000 UNIT/ML IJ SOLN
5000.0000 [IU] | Freq: Three times a day (TID) | INTRAMUSCULAR | Status: DC
  Administered 2014-05-02 – 2014-05-03 (×3): 5000 [IU] via SUBCUTANEOUS
  Filled 2014-05-01 (×6): qty 1

## 2014-05-01 MED ORDER — SODIUM CHLORIDE 0.9 % IJ SOLN
3.0000 mL | Freq: Two times a day (BID) | INTRAMUSCULAR | Status: DC
  Administered 2014-05-02 – 2014-05-03 (×3): 3 mL via INTRAVENOUS

## 2014-05-01 MED ORDER — PANTOPRAZOLE SODIUM 40 MG PO TBEC
40.0000 mg | DELAYED_RELEASE_TABLET | Freq: Every day | ORAL | Status: DC
  Administered 2014-05-02 – 2014-05-03 (×2): 40 mg via ORAL
  Filled 2014-05-01 (×2): qty 1

## 2014-05-01 MED ORDER — ZOLPIDEM TARTRATE 5 MG PO TABS
5.0000 mg | ORAL_TABLET | Freq: Every evening | ORAL | Status: DC | PRN
  Administered 2014-05-02 (×2): 5 mg via ORAL
  Filled 2014-05-01 (×2): qty 1

## 2014-05-01 MED ORDER — NITROGLYCERIN 0.4 MG SL SUBL: 0.4000 mg | SUBLINGUAL_TABLET | SUBLINGUAL | Status: DC | PRN

## 2014-05-01 NOTE — ED Notes (Signed)
Report given to RN on 2W 

## 2014-05-01 NOTE — ED Notes (Signed)
Per pt since Saturday she has felt her heart beat skipping intermittently and epigastric pain and nausea.

## 2014-05-01 NOTE — ED Notes (Signed)
Patrick Jupiter (husband) - please contact with updates at [phone]

## 2014-05-01 NOTE — Progress Notes (Signed)
PHARMACIST - PHYSICIAN ORDER COMMUNICATION  CONCERNING: P&T Medication Policy on Herbal Medications  DESCRIPTION:  This patient's order for:  Glucosamine Chrondroitin  has been noted.  This product(s) is classified as an "herbal" or natural product. Due to a lack of definitive safety studies or FDA approval, nonstandard manufacturing practices, plus the potential risk of unknown drug-drug interactions while on inpatient medications, the Pharmacy and Therapeutics Committee does not permit the use of "herbal" or natural products of this type within [facility].   ACTION TAKEN: The pharmacy department is unable to verify this order at this time and your patient has been informed of this safety policy. Please reevaluate patient's clinical condition at discharge and address if the herbal or natural product(s) should be resumed at that time.

## 2014-05-01 NOTE — H&P (Signed)
[facility] History and Physical  Amy Tapia UXL:[id number] DOB: 02/28/1933 DOA: 05/01/2014  Referring physician: ED physician PCP: No primary care provider on file.  Specialists:   Chief Complaint: Chest pain and cough  HPI: Amy Tapia is a 81 y.o. female with past medical history of hypertension, hyperlipidemia, glaucoma, who presents with chest pain and cough.  Patient reports that she had cold-like symptoms last week, including sore throat, productive cough and shortness of breath. she was treated with azithromycin for 4 days by her PCP. Her symptoms improved significantly, but she still has mild cough and minimal amount of sputum production which is greenish in color. She reports that in the past several days, she has been having pressure-like chest pain, which is moderate, intermittent. It is associated with shortness of breath. Currently patient does not have chest pain. She does not have fever, or chills. She feels like she has some skipped heartbeats sometimes. She also has mild epigastric pain and nausea. No vomiting, abdominal pain or diarrhea. She reports that she has chronic intermittent ear infection. She had mild ear pain recently, which has resolved 2 days ago. Currently patient does not have any chest pain.  ROS: currently patient denies fever, chills, running nose, headaches, diarrhea, constipation, dysuria, urgency, frequency, hematuria, skin rashes, joint pain or leg swelling. No unilateral weakness, numbness or tingling sensations. No vision change or hearing loss.  In ED, patient was found to have negative troponin, WBC 6.9, temperature normal., Negative lipase. EKG showed occasional PVC, first degree AV block which is an old issue. Chest x-ray showed opacisity, which is more likely to be atelectasis versus scarring, rather than pneumonia. Patient is admitted to inpatient for further evaluation and treatment.  Review of Systems: As presented in the history of  presenting illness, rest negative.  Where does patient live? At home Can patient participate in ADLs? Yes  Allergy:  Allergies  Allergen Reactions  . Penicillins Shortness Of Breath    Past Medical History  Diagnosis Date  . Hypertension   . HLD (hyperlipidemia)     Past Surgical History  Procedure Laterality Date  . Breast surgery    . Abdominal hysterectomy    . Tonsillectomy      Social History:  reports that she has never smoked. She does not have any smokeless tobacco history on file. She reports that she does not drink alcohol. Her drug history is not on file.  Family History:  Family History  Problem Relation Age of Onset  . Glaucoma Mother   . Hyperthyroidism Mother   . Heart disease Brother   . Rheumatic fever Sister   . Arthritis Sister      Prior to Admission medications   Medication Sig Start Date End Date Taking? Authorizing Provider  Ascorbic Acid (VITAMIN C) 100 MG tablet Take 100 mg by mouth daily.   Yes Historical Provider, MD  aspirin 81 MG tablet Take 81 mg by mouth daily.   Yes Historical Provider, MD  atenolol (TENORMIN) 25 MG tablet Take 25 mg by mouth daily.   Yes Historical Provider, MD  bimatoprost (LUMIGAN) 0.01 % SOLN Place 1 drop into both eyes at bedtime.   Yes Historical Provider, MD  brimonidine-timolol (COMBIGAN) 0.2-0.5 % ophthalmic solution Place 1 drop into both eyes every 12 (twelve) hours.   Yes Historical Provider, MD  cholecalciferol (VITAMIN D) 1000 UNITS tablet Take 1,000 Units by mouth daily.   Yes Historical Provider, MD  Fish Oil-Cholecalciferol (FISH OIL + D3  PO) Take 1,000 mg by mouth daily.   Yes Historical Provider, MD  Misc Natural Products ([employer] GLUCOS-CHONDROIT-MSM DS) TABS Take 1 tablet by mouth daily.   Yes Historical Provider, MD  pravastatin (PRAVACHOL) 40 MG tablet Take 1 tablet (40 mg total) by mouth daily. 10/11/13  Yes Virginia Crews, MD  vitamin E 100 UNIT capsule Take 100 Units by mouth daily.   Yes  Historical Provider, MD  zolpidem (AMBIEN) 10 MG tablet Take 5 mg by mouth at bedtime as needed for sleep.   Yes Historical Provider, MD    Physical Exam: Filed Vitals:   05/01/14 2145 05/01/14 2213 05/01/14 2230 05/01/14 2245  BP: 120/61 124/50 133/79 128/48  Pulse: 38 60 60 59  Temp:      TempSrc:      Resp: 16 17 15 14   SpO2: 95% 95% 95% 93%   General: Not in acute distress HEENT:       Eyes: PERRL, EOMI, no scleral icterus       ENT: No discharge from the ears and nose, no pharynx injection, no tonsillar enlargement.        Neck: No JVD, no bruit, no mass felt. Cardiac: S1/S2, RRR, No murmurs, No gallops or rubs Pulm: Good air movement bilaterally. Clear to auscultation bilaterally. No rales, wheezing, rhonchi or rubs. Abd: Soft, nondistended, nontender, no rebound pain, no organomegaly, BS present Ext: No edema bilaterally. 2+DP/PT pulse bilaterally Musculoskeletal: No joint deformities, erythema, or stiffness, ROM full Skin: No rashes.  Neuro: Alert and oriented X3, cranial nerves II-XII grossly intact, muscle strength 5/5 in all extremeties, sensation to light touch intact.  Psych: Patient is not psychotic, no suicidal or hemocidal ideation.  Labs on Admission:  Basic Metabolic Panel:  Recent Labs Lab 05/01/14 1854  NA 142  K 4.1  CL 106  CO2 26  GLUCOSE 113*  BUN 15  CREATININE 0.77  CALCIUM 9.4   Liver Function Tests:  Recent Labs Lab 05/01/14 1854  AST 22  ALT 19  ALKPHOS 63  BILITOT 0.5  PROT 7.3  ALBUMIN 3.9    Recent Labs Lab 05/01/14 1854  LIPASE 35   No results for input(s): AMMONIA in the last 168 hours. CBC:  Recent Labs Lab 05/01/14 1854  WBC 6.9  NEUTROABS 3.7  HGB 14.3  HCT 42.8  MCV 89.7  PLT 295   Cardiac Enzymes: No results for input(s): CKTOTAL, CKMB, CKMBINDEX, TROPONINI in the last 168 hours.  BNP (last 3 results) No results for input(s): BNP in the last 8760 hours.  ProBNP (last 3 results) No results for  input(s): PROBNP in the last 8760 hours.  CBG: No results for input(s): GLUCAP in the last 168 hours.  Radiological Exams on Admission: Dg Chest 2 View  05/01/2014   CLINICAL DATA:  Midsternal chest pain, history of hypertension and right breast cancer with mastectomy. Nonsmoker.  EXAM: CHEST  2 VIEW  COMPARISON:  10/10/2013  FINDINGS: Mild lingular opacity, favor atelectasis or scarring. Lungs are clear otherwise. Mild aortic atherosclerotic calcification. There may be a small hiatal hernia. Heart size upper normal. Mediastinal contours otherwise within normal range. No pleural effusion or pneumothorax. Right mastectomy. Multilevel degenerative change.  IMPRESSION: Mild linear lingular opacity is favored to reflect atelectasis or scarring.   Electronically Signed   By: Carlos Levering M.D.   On: 05/01/2014 21:40    EKG: Independently reviewed.  Abnormal findings:  EKG showed occasional PVC, first degree AV block which is an  old issue, and existed in previous EKG on 10/11/13.  Assessment/Plan Principal Problem:   Chest pain Active Problems:   HTN (hypertension)   HLD (hyperlipidemia)   Nausea   Cough  Chest pain: She has atypical chest pain. Currently patient does not have any chest pain. EKG did not show new ischemic change. Chest x-ray is not consistent with pneumonia. Will admit to hospital for chest pain rule out  - will admit to Tele bed  - cycle CE q6 x3 and repeat her EKG in the am  - Nitroglycerin, Morphine, and aspirin, lipitor, atenolol - Risk factor stratification: will check TSH (LDL was 95 and A1c was 5.9 on 10/11/13). - Consider cardiology consult if test positive for CEs  - 2d echo - Protonix and Mylanta for possible acid reflux  Cough: Patient had a cold-like symptoms recently, it has improved significantly after being treated with azithromycin, but she still has some mild productive cough and shortness of breath. Liklely viral upper respiratory infection.  -Nebulizers:  prn albuterol -Mucinex for cough  -Follow up sputum culture, respiratory virus panel, Flu pcr  Hyperlipidemia: LDL was 95 on 10/11/13. Patient is on pravastatin at home. -Continue pravastatin next  Hypertension: -Continue atenolol and fish oil   DVT ppx: SQ Heparin   Code Status: Full code Family Communication: None at bed side.      Disposition Plan: Admit to inpatient   Date of Service 05/01/2014    Ivor Costa [facility] Pager [phone]  If 7PM-7AM, please contact night-coverage [email or web address] Password [email or web address] 05/01/2014, 11:01 PM

## 2014-05-01 NOTE — ED Notes (Signed)
Admitting physician at bedside

## 2014-05-01 NOTE — ED Notes (Signed)
Pt ambulated to restroom with steady gait.

## 2014-05-01 NOTE — ED Provider Notes (Signed)
CSN: [id number]     Arrival date & time 05/01/14  1841 History   First MD Initiated Contact with Patient 05/01/14 1920     Chief Complaint  Patient presents with  . Abdominal Pain  . Nausea     (Consider location/radiation/quality/duration/timing/severity/associated sxs/prior Treatment) HPI Comments: Patient presents with a four-day history of central chest pressure with nausea and epigastric pain. The pain lasts for minutes to hours and is central in her chest and upper abdomen and does not radiate. No vomiting. No diaphoresis, syncope, numbness, tingling, fever, chills. Recently treated for URI and cough and she is worried about pneumonia. She denies any dizziness or lightheadedness but has felt her heart skipping beats. She denies any CAD history. She was admitted to chest the hospital for chest pain last fall but did not have a stress test. The pain she is having today is different. She is a poor appetite and has not been eating much today.  The history is provided by the patient.    Past Medical History  Diagnosis Date  . Hypertension   . HLD (hyperlipidemia)    Past Surgical History  Procedure Laterality Date  . Breast surgery    . Abdominal hysterectomy    . Tonsillectomy     Family History  Problem Relation Age of Onset  . Glaucoma Mother   . Hyperthyroidism Mother   . Heart disease Brother   . Rheumatic fever Sister   . Arthritis Sister    History  Substance Use Topics  . Smoking status: Never Smoker   . Smokeless tobacco: Not on file  . Alcohol Use: No   OB History    No data available     Review of Systems  Constitutional: Positive for activity change, appetite change and fatigue. Negative for fever.  HENT: Negative for congestion.   Eyes: Negative for visual disturbance.  Respiratory: Positive for chest tightness and shortness of breath.   Cardiovascular: Positive for chest pain.  Gastrointestinal: Positive for nausea and abdominal pain. Negative for  vomiting.  Genitourinary: Negative for dysuria, hematuria and vaginal bleeding.  Musculoskeletal: Negative for myalgias, back pain and arthralgias.  Skin: Negative for rash.  Neurological: Positive for weakness. Negative for dizziness and headaches.  A complete 10 system review of systems was obtained and all systems are negative except as noted in the HPI and PMH.      Allergies  Penicillins  Home Medications   Prior to Admission medications   Medication Sig Start Date End Date Taking? Authorizing Provider  Ascorbic Acid (VITAMIN C) 100 MG tablet Take 100 mg by mouth daily.   Yes Historical Provider, MD  aspirin 81 MG tablet Take 81 mg by mouth daily.   Yes Historical Provider, MD  atenolol (TENORMIN) 25 MG tablet Take 25 mg by mouth daily.   Yes Historical Provider, MD  bimatoprost (LUMIGAN) 0.01 % SOLN Place 1 drop into both eyes at bedtime.   Yes Historical Provider, MD  brimonidine-timolol (COMBIGAN) 0.2-0.5 % ophthalmic solution Place 1 drop into both eyes every 12 (twelve) hours.   Yes Historical Provider, MD  cholecalciferol (VITAMIN D) 1000 UNITS tablet Take 1,000 Units by mouth daily.   Yes Historical Provider, MD  Fish Oil-Cholecalciferol (FISH OIL + D3 PO) Take 1,000 mg by mouth daily.   Yes Historical Provider, MD  Misc Natural Products ([employer] GLUCOS-CHONDROIT-MSM DS) TABS Take 1 tablet by mouth daily.   Yes Historical Provider, MD  pravastatin (PRAVACHOL) 40 MG tablet Take 1 tablet (  40 mg total) by mouth daily. 10/11/13  Yes Virginia Crews, MD  vitamin E 100 UNIT capsule Take 100 Units by mouth daily.   Yes Historical Provider, MD  zolpidem (AMBIEN) 10 MG tablet Take 5 mg by mouth at bedtime as needed for sleep.   Yes Historical Provider, MD   BP 128/48 mmHg  Pulse 59  Temp(Src) 98.1 F (36.7 C) (Oral)  Resp 14  SpO2 93% Physical Exam  Constitutional: She is oriented to person, place, and time. She appears well-developed and well-nourished. No distress.  HENT:   Head: Normocephalic and atraumatic.  Mouth/Throat: Oropharynx is clear and moist. No oropharyngeal exudate.  Eyes: Conjunctivae and EOM are normal. Pupils are equal, round, and reactive to light.  Neck: Normal range of motion. Neck supple.  No meningismus.  Cardiovascular: Normal rate, regular rhythm, normal heart sounds and intact distal pulses.   No murmur heard. Pulmonary/Chest: Effort normal and breath sounds normal. No respiratory distress.  Abdominal: Soft. There is no tenderness. There is no rebound and no guarding.  No epigastric or right upper quadrant tenderness  Musculoskeletal: Normal range of motion. She exhibits no edema or tenderness.  Neurological: She is alert and oriented to person, place, and time. No cranial nerve deficit. She exhibits normal muscle tone. Coordination normal.  No ataxia on finger to nose bilaterally. No pronator drift. 5/5 strength throughout. CN 2-12 intact. Negative Romberg. Equal grip strength. Sensation intact. Gait is normal.   Skin: Skin is warm.  Psychiatric: She has a normal mood and affect. Her behavior is normal.  Nursing note and vitals reviewed.   ED Course  Procedures (including critical care time) Labs Review Labs Reviewed  COMPREHENSIVE METABOLIC PANEL - Abnormal; Notable for the following:    Glucose, Bld 113 (*)    GFR calc non Af Amer 78 (*)    All other components within normal limits  CBC WITH DIFFERENTIAL/PLATELET  LIPASE, BLOOD  I-STAT TROPOININ, ED    Imaging Review Dg Chest 2 View  05/01/2014   CLINICAL DATA:  Midsternal chest pain, history of hypertension and right breast cancer with mastectomy. Nonsmoker.  EXAM: CHEST  2 VIEW  COMPARISON:  10/10/2013  FINDINGS: Mild lingular opacity, favor atelectasis or scarring. Lungs are clear otherwise. Mild aortic atherosclerotic calcification. There may be a small hiatal hernia. Heart size upper normal. Mediastinal contours otherwise within normal range. No pleural effusion or  pneumothorax. Right mastectomy. Multilevel degenerative change.  IMPRESSION: Mild linear lingular opacity is favored to reflect atelectasis or scarring.   Electronically Signed   By: Carlos Levering M.D.   On: 05/01/2014 21:40     EKG Interpretation   Date/Time:  Wednesday May 01 2014 18:47:38 EDT Ventricular Rate:  63 PR Interval:  218 QRS Duration: 92 QT Interval:  420 QTC Calculation: 429 R Axis:   7 Text Interpretation:  Sinus rhythm with 1st degree A-V block with  occasional Premature ventricular complexes Otherwise normal ECG No  significant change was found Confirmed by Wyvonnia Dusky  MD, Netasha Wehrli [phone]) on  05/01/2014 7:20:50 PM      MDM   Final diagnoses:  Chest pain, unspecified chest pain type   Palpitations, chest pressure with nausea for the past several days. Not exertional. EKG normal sinus rhythm with PVCs.  WBC normal.  LFTs and lipase normal.  CXR negative.  Troponin negative, EKG negative.  Pain has resolved in the ED.  Some concern for cardiac chest pain. Pain may also be GI related as has  been going on for several hours.  Given risk factors, will admit for rule out.   Ezequiel Essex, MD 05/01/14 [phone]

## 2014-05-01 NOTE — ED Notes (Signed)
Per Dr Wyvonnia Dusky - ok for pt to have PO fluids.

## 2014-05-02 ENCOUNTER — Encounter (HOSPITAL_COMMUNITY): Payer: Self-pay | Admitting: General Practice

## 2014-05-02 DIAGNOSIS — E785 Hyperlipidemia, unspecified: Secondary | ICD-10-CM

## 2014-05-02 DIAGNOSIS — I10 Essential (primary) hypertension: Secondary | ICD-10-CM

## 2014-05-02 DIAGNOSIS — J209 Acute bronchitis, unspecified: Secondary | ICD-10-CM | POA: Diagnosis not present

## 2014-05-02 DIAGNOSIS — R110 Nausea: Secondary | ICD-10-CM | POA: Diagnosis not present

## 2014-05-02 DIAGNOSIS — R079 Chest pain, unspecified: Secondary | ICD-10-CM | POA: Diagnosis not present

## 2014-05-02 DIAGNOSIS — R05 Cough: Secondary | ICD-10-CM

## 2014-05-02 DIAGNOSIS — R0789 Other chest pain: Secondary | ICD-10-CM | POA: Diagnosis not present

## 2014-05-02 LAB — INFLUENZA PANEL BY PCR (TYPE A & B)
H1N1FLUPCR: NOT DETECTED
INFLAPCR: NEGATIVE
Influenza B By PCR: NEGATIVE

## 2014-05-02 LAB — CBC
HCT: 39.2 % (ref 36.0–46.0)
Hemoglobin: 12.9 g/dL (ref 12.0–15.0)
MCH: 29.6 pg (ref 26.0–34.0)
MCHC: 32.9 g/dL (ref 30.0–36.0)
MCV: 89.9 fL (ref 78.0–100.0)
Platelets: 243 10*3/uL (ref 150–400)
RBC: 4.36 MIL/uL (ref 3.87–5.11)
RDW: 13.5 % (ref 11.5–15.5)
WBC: 6.9 10*3/uL (ref 4.0–10.5)

## 2014-05-02 LAB — BASIC METABOLIC PANEL
Anion gap: 9 (ref 5–15)
BUN: 13 mg/dL (ref 6–23)
CALCIUM: 8.9 mg/dL (ref 8.4–10.5)
CO2: 24 mmol/L (ref 19–32)
Chloride: 109 mmol/L (ref 96–112)
Creatinine, Ser: 0.71 mg/dL (ref 0.50–1.10)
GFR calc Af Amer: >90 mL/min (ref >90)
GFR, EST NON AFRICAN AMERICAN: 80 mL/min — AB (ref >90)
Glucose, Bld: 94 mg/dL (ref 70–99)
Potassium: 4.2 mmol/L (ref 3.5–5.1)
SODIUM: 142 mmol/L (ref 135–145)

## 2014-05-02 LAB — TSH: TSH: 3.878 u[IU]/mL (ref 0.350–4.500)

## 2014-05-02 LAB — TROPONIN I
Troponin I: 0.03 ng/mL (ref <0.031)
Troponin I: <0.03 ng/mL (ref <0.031)

## 2014-05-02 LAB — PROTIME-INR
INR: 1.11 (ref 0.00–1.49)
PROTHROMBIN TIME: 14.4 s (ref 11.6–15.2)

## 2014-05-02 MED ORDER — PROMETHAZINE HCL 12.5 MG PO TABS: 12.5000 mg | ORAL_TABLET | Freq: Four times a day (QID) | ORAL | Status: DC | PRN

## 2014-05-02 MED ORDER — ALBUTEROL SULFATE HFA 108 (90 BASE) MCG/ACT IN AERS: 2.0000 | INHALATION_SPRAY | Freq: Four times a day (QID) | RESPIRATORY_TRACT | Status: DC | PRN

## 2014-05-02 MED ORDER — LEVOFLOXACIN 500 MG PO TABS: 500.0000 mg | ORAL_TABLET | Freq: Every day | ORAL | Status: DC

## 2014-05-02 MED ORDER — LEVOFLOXACIN 750 MG PO TABS
750.0000 mg | ORAL_TABLET | Freq: Every day | ORAL | Status: DC
  Administered 2014-05-02 – 2014-05-03 (×2): 750 mg via ORAL
  Filled 2014-05-02 (×2): qty 1

## 2014-05-02 MED ORDER — LORATADINE 10 MG PO TABS: 10.0000 mg | ORAL_TABLET | Freq: Every day | ORAL | Status: DC

## 2014-05-02 MED ORDER — LEVOFLOXACIN 750 MG PO TABS: 750.0000 mg | ORAL_TABLET | Freq: Every day | ORAL | Status: DC

## 2014-05-02 MED ORDER — DM-GUAIFENESIN ER 30-600 MG PO TB12: 1.0000 | ORAL_TABLET | Freq: Two times a day (BID) | ORAL | Status: DC

## 2014-05-02 NOTE — Progress Notes (Signed)
[facility]                                                                              Patient Demographics  Amy Tapia, is a 81 y.o. female, DOB - 02-16-1933, GUR:[id number]  Admit date - 05/01/2014   Admitting Physician Ivor Costa, MD  Outpatient Primary MD for the patient is Dwan Bolt, MD  LOS - 1   Chief Complaint  Patient presents with  . Abdominal Pain  . Nausea       Brief HPI   Patient is a 81 year old female with hypertension, hyperlipidemia, gout, presented with chest pain and cough. Patient reported cold-like symptoms last week, sore throat, productive cough and shortness of breath. Patient was treated with a Zithromax for 4 days by her PCP. Patient reported that her symptoms improved somewhat but she still had mild cough and minimal amount productive phlegm, greenish color. Also, she reported that in the past several days, she has been having pressure-like chest pain, moderate, intermittent associated with shortness of breath however at the time of admission, she did not have any chest pain. She denies any fevers or chills. She also felt that she had some skipped heartbeats as sometimes, mild epigastric pain and nausea. No vomiting or diarrhea.   Assessment & Plan    Principal Problem:   Chest pain atypical - Currently no chest pain, EKG did not show ST-T wave changes suggestive of ischemia, chest x-ray did not show any pneumonia - Serial cardiac enzymes negative so far - Will continue Protonix, check 2-D echocardiogram  Active Problems: Cough with acute bronchitis: possibly causing chest tightness - Respiratory virus panel pending, influenza panel negative - Placed on Levaquin, Mucinex, albuterol nebs as needed    HTN (hypertension) - Continue atenolol    HLD (hyperlipidemia) - Continue pravastatin   Code Status: full code  Family Communication: Discussed in detail with the patient, all imaging results, lab results explained to  the patient   Disposition Plan: home when echo results are available  Time Spent in minutes  25 minutes  Procedures  2d echo  Consults   None   DVT Prophylaxis heparin   Medications  Scheduled Meds: . aspirin EC  81 mg Oral Daily  . atenolol  25 mg Oral Daily  . brimonidine  1 drop Both Eyes BID   And  . timolol  1 drop Both Eyes BID  . cholecalciferol  1,000 Units Oral Daily  . dextromethorphan-guaiFENesin  1 tablet Oral BID  . heparin  5,000 Units Subcutaneous 3 times per day  . latanoprost  1 drop Both Eyes QHS  . levofloxacin  750 mg Oral Daily  . omega-3 acid ethyl esters  1 g Oral Daily  . pantoprazole  40 mg Oral Q1200  . pravastatin  40 mg Oral q1800  . sodium chloride  3 mL Intravenous Q12H  . vitamin C  500 mg Oral Daily  . vitamin E  100 Units Oral Daily   Continuous Infusions: . sodium chloride 75 mL/hr at 05/02/14 0011   PRN Meds:.acetaminophen **OR** acetaminophen, albuterol, alum & mag hydroxide-simeth, morphine injection, nitroGLYCERIN, ondansetron **OR** ondansetron (  ZOFRAN) IV, zolpidem   Antibiotics   Anti-infectives    Start     Dose/Rate Route Frequency Ordered Stop   05/02/14 1000  levofloxacin (LEVAQUIN) tablet 500 mg  Status:  Discontinued     500 mg Oral Daily 05/02/14 0903 05/02/14 0904   05/02/14 1000  levofloxacin (LEVAQUIN) tablet 750 mg     750 mg Oral Daily 05/02/14 0904     05/02/14 0000  levofloxacin (LEVAQUIN) 750 MG tablet     750 mg Oral Daily 05/02/14 0951          Subjective:   Amy Tapia was seen and examined today. Patient seen earlier this morning. Patient denied dizziness, chest pain, shortness of breath, abdominal pain, N/V/D/C, new weakness, numbess, tingling. No acute events overnight.  No fevers or chills, still having some coughing, feels that she had chest tightness and coughing and wheezing  Objective:   Blood pressure 117/51, pulse 59, temperature 97.9 F (36.6 C), temperature source Oral, resp. rate 17,  height 5\' 8"  (1.727 m), weight 80.7 kg (177 lb 14.6 oz), SpO2 94 %.  Wt Readings from Last 3 Encounters:  05/01/14 80.7 kg (177 lb 14.6 oz)  10/11/13 80.468 kg (177 lb 6.4 oz)     Intake/Output Summary (Last 24 hours) at 05/02/14 1724 Last data filed at 05/02/14 1417  Gross per 24 hour  Intake    990 ml  Output    350 ml  Net    640 ml    Exam  General: Alert and oriented x 3, NAD  HEENT:  PERRLA, EOMI, Anicteic Sclera, mucous membranes moist.   Neck: Supple, no JVD, no masses  CVS: S1 S2 auscultated, no rubs, murmurs or gallops. Regular rate and rhythm.  Respiratory: Decreased breath sounds at the bases  Abdomen: Soft, nontender, nondistended, + bowel sounds  Ext: no cyanosis clubbing or edema  Neuro: AAOx3, Cr N's II- XII. Strength 5/5 upper and lower extremities bilaterally  Skin: No rashes  Psych: Normal affect and demeanor, alert and oriented x3    Data Review   Micro Results No results found for this or any previous visit (from the past 240 hour(s)).  Radiology Reports Dg Chest 2 View  05/01/2014   CLINICAL DATA:  Midsternal chest pain, history of hypertension and right breast cancer with mastectomy. Nonsmoker.  EXAM: CHEST  2 VIEW  COMPARISON:  10/10/2013  FINDINGS: Mild lingular opacity, favor atelectasis or scarring. Lungs are clear otherwise. Mild aortic atherosclerotic calcification. There may be a small hiatal hernia. Heart size upper normal. Mediastinal contours otherwise within normal range. No pleural effusion or pneumothorax. Right mastectomy. Multilevel degenerative change.  IMPRESSION: Mild linear lingular opacity is favored to reflect atelectasis or scarring.   Electronically Signed   By: Carlos Levering M.D.   On: 05/01/2014 21:40    CBC  Recent Labs Lab 05/01/14 1854 05/02/14 0527  WBC 6.9 6.9  HGB 14.3 12.9  HCT 42.8 39.2  PLT 295 243  MCV 89.7 89.9  MCH 30.0 29.6  MCHC 33.4 32.9  RDW 13.4 13.5  LYMPHSABS 2.2  --   MONOABS 0.6  --    EOSABS 0.3  --   BASOSABS 0.0  --     Chemistries   Recent Labs Lab 05/01/14 1854 05/02/14 0527  NA 142 142  K 4.1 4.2  CL 106 109  CO2 26 24  GLUCOSE 113* 94  BUN 15 13  CREATININE 0.77 0.71  CALCIUM 9.4 8.9  AST 22  --  ALT 19  --   ALKPHOS 63  --   BILITOT 0.5  --    ------------------------------------------------------------------------------------------------------------------ estimated creatinine clearance is 64.6 mL/min (by C-G formula based on Cr of 0.71). ------------------------------------------------------------------------------------------------------------------ No results for input(s): HGBA1C in the last 72 hours. ------------------------------------------------------------------------------------------------------------------ No results for input(s): CHOL, HDL, LDLCALC, TRIG, CHOLHDL, LDLDIRECT in the last 72 hours. ------------------------------------------------------------------------------------------------------------------  Recent Labs  05/02/14 0527  TSH 3.878   ------------------------------------------------------------------------------------------------------------------ No results for input(s): VITAMINB12, FOLATE, FERRITIN, TIBC, IRON, RETICCTPCT in the last 72 hours.  Coagulation profile  Recent Labs Lab 05/02/14 0104  INR 1.11    No results for input(s): DDIMER in the last 72 hours.  Cardiac Enzymes  Recent Labs Lab 05/02/14 0104 05/02/14 0527 05/02/14 1235  TROPONINI 0.03 <0.03 <0.03   ------------------------------------------------------------------------------------------------------------------ Invalid input(s): POCBNP  No results for input(s): GLUCAP in the last 72 hours.   RAI,RIPUDEEP M.D. [facility] 05/02/2014, 5:24 PM  Pager: [phone]   Between 7am to 7pm - call Pager - [phone]  After 7pm go to [email or web address] - password [email or web address]  Call night coverage person covering after 7pm

## 2014-05-02 NOTE — Care Management Note (Signed)
    Page 1 of 1   05/02/2014     2:50:53 PM CARE MANAGEMENT NOTE 05/02/2014  Patient:  Amy Tapia, Amy Tapia   Account Number:  [id number]  Date Initiated:  05/02/2014  Documentation initiated by:  Marvetta Gibbons  Subjective/Objective Assessment:   Pt admitted with chest pain , echo pending     Action/Plan:   PTA pt lived at home PT/OT evals pending- NCM to follow for recommendations   Anticipated DC Date:  05/02/2014   Anticipated DC Plan:  [place]  CM consult      [facility] Choice  HOME HEALTH   Choice offered to / List presented to:  C-1 Patient        [place] arranged  [place] - 11 Patient Refused      Status of service:  Completed, signed off Medicare Important Message given?   (If response is "NO", the following Medicare IM given date fields will be blank) Date Medicare IM given:   Medicare IM given by:   Date Additional Medicare IM given:   Additional Medicare IM given by:    Discharge Disposition:  HOME/SELF CARE  Per UR Regulation:  Reviewed for med. necessity/level of care/duration of stay  If discussed at [place] of Stay Meetings, dates discussed:    Comments:  05/02/14- 81- Marvetta Gibbons RN, BSN [phone] [id number] given and explained to pt- copy placed in shadow chart.  update- 05/02/14- 1445- Marvetta Gibbons RN, BSN - referral for HH-PT/OT- in to speak with pt at bedside- per conversation pt declines [place] referral - discussed PT recommendations for [facility]- pt still politely declined any HH referral- states that she just needs to get over feeling bad and her strength will return- has spouse at home to assist. - no referral for [facility] made.

## 2014-05-02 NOTE — Progress Notes (Signed)
  Echocardiogram 2D Echocardiogram has been performed.  Darlina Sicilian M 05/02/2014, 1:41 PM

## 2014-05-02 NOTE — Progress Notes (Signed)
OT Cancellation Note  Patient Details Name: Amy Tapia MRN: [id number] DOB: 06/25/1933   Cancelled Treatment:    Reason Eval/Treat Not Completed: OT screened, no needs identified, will sign off. Pt ambulating with PT at an independent level in the hallways, reporting no issues with BADLs, and has her husband with her 24/7.  Almon Register [phone] 05/02/2014, 9:55 AM

## 2014-05-02 NOTE — Evaluation (Signed)
Physical Therapy Evaluation Patient Details Name: Amy Tapia MRN: [id number] DOB: [birth year] Today's Date: 05/02/2014   History of Present Illness  Pt is a 81 y.o. female with PMH of HTN, hyperlipidemia, glaucoma, who presents with chest pain and cough. Patient reports that she had cold-like symptoms last week, including sore throat, productive cough and SOB. She reports that in the past several days, she has been having pressure-like chest pain, which is moderate, intermittent. It is associated with shortness of breath. She does not have fever, or chills. She feels like she has some skipped heartbeats sometimes. She also has mild epigastric pain and nausea. She reports that she has chronic intermittent ear infection. She had mild ear pain recently, which has resolved 2 days ago.  Clinical Impression  Pt admitted with above diagnosis. Pt currently with functional limitations due to the deficits listed below (see PT Problem List). At the time of PT eval pt was able to perform transfers and ambulation with mod I to supervision level. Tolerance for functional activity is decreased compared to baseline, however pt is overall doing well functionally. Will keep on PT caseload acutely to improve overall strength and endurance. Pt will benefit from skilled PT to increase their independence and safety with mobility to allow discharge to the venue listed below.       Follow Up Recommendations Home health PT;Supervision - Intermittent    Equipment Recommendations  None recommended by PT    Recommendations for Other Services       Precautions / Restrictions Precautions Precautions: Fall Restrictions Weight Bearing Restrictions: No      Mobility  Bed Mobility Overal bed mobility: Modified Independent Bed Mobility: Supine to Sit     Supine to sit: HOB elevated     General bed mobility comments: Pt did not require any assistance to transition to EOB. Increased time and HOB elevated.    Transfers Overall transfer level: Modified independent Equipment used: None Transfers: Sit to/from Stand           General transfer comment: Pt able to power-up to full standing without assistance. No unsteadiness or LOB noted.   Ambulation/Gait Ambulation/Gait assistance: Supervision Ambulation Distance (Feet): 300 Feet Assistive device: None Gait Pattern/deviations: Step-through pattern;Decreased stride length;Trunk flexed Gait velocity: Decreased Gait velocity interpretation: Below normal speed for age/gender General Gait Details: Pt was able to ambulate fairly well in the hall without and AD. Occasional unsteadiness noted however pt was able to recover independently. Pt reports fatigue at end of gait training, which reportedly improved with seated rest break.   Stairs            Wheelchair Mobility    Modified Rankin (Stroke Patients Only)       Balance Overall balance assessment: Needs assistance Sitting-balance support: Feet supported;No upper extremity supported Sitting balance-Leahy Scale: Good     Standing balance support: No upper extremity supported;During functional activity Standing balance-Leahy Scale: Fair                               Pertinent Vitals/Pain Pain Assessment: 0-10 Pain Score: 4  Pain Descriptors / Indicators: Headache    Home Living Family/patient expects to be discharged to:: Private residence Living Arrangements: Spouse/significant other Available Help at Discharge: Family;Available 24 hours/day Type of Home: House Home Access: Stairs to enter Entrance Stairs-Rails: Left Entrance Stairs-Number of Steps: 5 Home Layout: Able to live on main level with bedroom/bathroom;Two level Home Equipment:  None      Prior Function Level of Independence: Independent         Comments: Still drives, does all cooking and cleaning.      Hand Dominance   Dominant Hand: Right    Extremity/Trunk Assessment   Upper  Extremity Assessment: Overall WFL for tasks assessed           Lower Extremity Assessment: Generalized weakness (Bilateral hip flexors (4-/5) and quads (4/5))      Cervical / Trunk Assessment: Normal  Communication   Communication: HOH  Cognition Arousal/Alertness: Awake/alert Behavior During Therapy: WFL for tasks assessed/performed Overall Cognitive Status: Within Functional Limits for tasks assessed                      General Comments      Exercises        Assessment/Plan    PT Assessment Patient needs continued PT services  PT Diagnosis Difficulty walking;Generalized weakness   PT Problem List Decreased range of motion;Decreased strength;Decreased activity tolerance;Decreased balance;Decreased mobility;Decreased knowledge of use of DME;Decreased safety awareness;Decreased knowledge of precautions;Cardiopulmonary status limiting activity  PT Treatment Interventions DME instruction;Gait training;Stair training;Functional mobility training;Therapeutic activities;Therapeutic exercise;Neuromuscular re-education;Patient/family education   PT Goals (Current goals can be found in the Care Plan section) Acute Rehab PT Goals Patient Stated Goal: Return to PLOF - get home to her grandson which she cares for 2x/week PT Goal Formulation: With patient/family Time For Goal Achievement: 05/09/14 Potential to Achieve Goals: Good    Frequency Min 3X/week   Barriers to discharge        Co-evaluation               End of Session Equipment Utilized During Treatment: Gait belt Activity Tolerance: Patient tolerated treatment well Patient left: in chair;with call bell/phone within reach;with family/visitor present Nurse Communication: Mobility status    Functional Assessment Tool Used: Clinical judgement Functional Limitation: Mobility: Walking and moving around Mobility: Walking and Moving Around Current Status [phone]): At least 1 percent but less than 20 percent  impaired, limited or restricted Mobility: Walking and Moving Around Goal Status [phone]): At least 1 percent but less than 20 percent impaired, limited or restricted    Time: 0926-0950 PT Time Calculation (min) (ACUTE ONLY): 24 min   Charges:   PT Evaluation $Initial PT Evaluation Tier I: 1 Procedure PT Treatments $Gait Training: 8-22 mins   PT G Codes:   PT G-Codes **NOT FOR INPATIENT CLASS** Functional Assessment Tool Used: Clinical judgement Functional Limitation: Mobility: Walking and moving around Mobility: Walking and Moving Around Current Status (W6203): At least 1 percent but less than 20 percent impaired, limited or restricted Mobility: Walking and Moving Around Goal Status [phone]): At least 1 percent but less than 20 percent impaired, limited or restricted    Rolinda Roan 05/02/2014, 1:04 PM  Rolinda Roan, PT, DPT Acute Rehabilitation Services Pager: [phone]

## 2014-05-02 NOTE — Progress Notes (Signed)
Pt a/o, c/o HA, PRN tylenol given as ordered, vss, pt stable

## 2014-05-02 NOTE — Progress Notes (Signed)
Patient  81 year old white female  admitted from ED from home with C/o of nausea and cold- like symptoms a  week ago and chest pain. Pt denied SOB and chest pain on admission. Alert awake and oriented x 4 oriented to room and use of call light for assist. Moves all extremities  Telemetry box #2W 30 in use . 1st degree heart block on the monitor with occasional PVC's. C/o of headache and tylenol given with some relief of H/A verbalized.. Rn will continue to monitor pt.

## 2014-05-02 NOTE — Progress Notes (Signed)
Utilization review completed.  

## 2014-05-03 DIAGNOSIS — E785 Hyperlipidemia, unspecified: Secondary | ICD-10-CM | POA: Diagnosis not present

## 2014-05-03 DIAGNOSIS — R110 Nausea: Secondary | ICD-10-CM | POA: Diagnosis not present

## 2014-05-03 DIAGNOSIS — R05 Cough: Secondary | ICD-10-CM | POA: Diagnosis not present

## 2014-05-03 DIAGNOSIS — I10 Essential (primary) hypertension: Secondary | ICD-10-CM | POA: Diagnosis not present

## 2014-05-03 LAB — CBC
HCT: 39.1 % (ref 36.0–46.0)
HEMOGLOBIN: 12.9 g/dL (ref 12.0–15.0)
MCH: 29.5 pg (ref 26.0–34.0)
MCHC: 33 g/dL (ref 30.0–36.0)
MCV: 89.5 fL (ref 78.0–100.0)
Platelets: 232 10*3/uL (ref 150–400)
RBC: 4.37 MIL/uL (ref 3.87–5.11)
RDW: 13.4 % (ref 11.5–15.5)
WBC: 5.7 10*3/uL (ref 4.0–10.5)

## 2014-05-03 LAB — BASIC METABOLIC PANEL
Anion gap: 10 (ref 5–15)
BUN: 14 mg/dL (ref 6–23)
CHLORIDE: 108 mmol/L (ref 96–112)
CO2: 24 mmol/L (ref 19–32)
CREATININE: 0.92 mg/dL (ref 0.50–1.10)
Calcium: 9 mg/dL (ref 8.4–10.5)
GFR calc Af Amer: 67 mL/min — ABNORMAL LOW (ref >90)
GFR, EST NON AFRICAN AMERICAN: 58 mL/min — AB (ref >90)
GLUCOSE: 100 mg/dL — AB (ref 70–99)
POTASSIUM: 4 mmol/L (ref 3.5–5.1)
Sodium: 142 mmol/L (ref 135–145)

## 2014-05-03 MED ORDER — ALBUTEROL SULFATE HFA 108 (90 BASE) MCG/ACT IN AERS: 2.0000 | INHALATION_SPRAY | Freq: Four times a day (QID) | RESPIRATORY_TRACT | Status: DC | PRN

## 2014-05-03 MED ORDER — DM-GUAIFENESIN ER 30-600 MG PO TB12: 1.0000 | ORAL_TABLET | Freq: Two times a day (BID) | ORAL | Status: DC | PRN

## 2014-05-03 MED ORDER — LEVOFLOXACIN 750 MG PO TABS: 750.0000 mg | ORAL_TABLET | Freq: Every day | ORAL | Status: DC

## 2014-05-03 MED ORDER — LORATADINE 10 MG PO TABS: 10.0000 mg | ORAL_TABLET | Freq: Every day | ORAL | Status: DC

## 2014-05-03 MED ORDER — SACCHAROMYCES BOULARDII 250 MG PO CAPS: 250.0000 mg | ORAL_CAPSULE | Freq: Two times a day (BID) | ORAL | Status: DC

## 2014-05-03 MED ORDER — PROMETHAZINE HCL 12.5 MG PO TABS: 12.5000 mg | ORAL_TABLET | Freq: Four times a day (QID) | ORAL | Status: DC | PRN

## 2014-05-03 NOTE — Discharge Summary (Signed)
Physician Discharge Summary   Patient ID: Amy Tapia MRN: [id number] DOB/AGE: [birth year] 81 y.o.  Admit date: 05/01/2014 Discharge date: 05/03/2014  Primary Care Physician:  Dwan Bolt, MD  Discharge Diagnoses:    Atypical chest pain  Acute bronchitis  . HTN (hypertension) . HLD (hyperlipidemia) . Nausea    Consults:  None   Recommendations for Outpatient Follow-up:   Patient was recommended outpatient stress test if she continues to have any recurrent chest pain issues despite the resolution of acute bronchitis.     DIET: Heart healthy diet    Allergies:   Allergies  Allergen Reactions  . Penicillins Shortness Of Breath     Discharge Medications:   Medication List    TAKE these medications        albuterol 108 (90 BASE) MCG/ACT inhaler  Commonly known as:  PROVENTIL HFA;VENTOLIN HFA  Inhale 2 puffs into the lungs every 6 (six) hours as needed for wheezing or shortness of breath.     aspirin 81 MG tablet  Take 81 mg by mouth daily.     atenolol 25 MG tablet  Commonly known as:  TENORMIN  Take 25 mg by mouth daily.     bimatoprost 0.01 % Soln  Commonly known as:  LUMIGAN  Place 1 drop into both eyes at bedtime.     cholecalciferol 1000 UNITS tablet  Commonly known as:  VITAMIN D  Take 1,000 Units by mouth daily.     COMBIGAN 0.2-0.5 % ophthalmic solution  Generic drug:  brimonidine-timolol  Place 1 drop into both eyes every 12 (twelve) hours.     [employer] GLUCOS-CHONDROIT-MSM DS Tabs  Take 1 tablet by mouth daily.     dextromethorphan-guaiFENesin 30-600 MG per 12 hr tablet  Commonly known as:  MUCINEX DM  Take 1 tablet by mouth 2 (two) times daily as needed for cough.     FISH OIL + D3 PO  Take 1,000 mg by mouth daily.     levofloxacin 750 MG tablet  Commonly known as:  LEVAQUIN  Take 1 tablet (750 mg total) by mouth daily. X 5 days  Start taking on:  05/04/2014     loratadine 10 MG tablet  Commonly known as:  CLARITIN  Take 1  tablet (10 mg total) by mouth daily.     pravastatin 40 MG tablet  Commonly known as:  PRAVACHOL  Take 1 tablet (40 mg total) by mouth daily.     promethazine 12.5 MG tablet  Commonly known as:  PHENERGAN  Take 1 tablet (12.5 mg total) by mouth every 6 (six) hours as needed for nausea or vomiting.     saccharomyces boulardii 250 MG capsule  Commonly known as:  FLORASTOR  Take 1 capsule (250 mg total) by mouth 2 (two) times daily. Take probiotic while on antibiotic     vitamin C 100 MG tablet  Take 100 mg by mouth daily.     vitamin E 100 UNIT capsule  Take 100 Units by mouth daily.     zolpidem 10 MG tablet  Commonly known as:  AMBIEN  Take 5 mg by mouth at bedtime as needed for sleep.         Brief H and P: For complete details please refer to admission H and P, but in brief Patient is a 81 year old female with hypertension, hyperlipidemia, gout, presented with chest pain and cough. Patient reported cold-like symptoms last week, sore throat, productive cough and shortness of breath. Patient was treated  with a Zithromax for 4 days by her PCP. Patient reported that her symptoms improved somewhat but she still had mild cough and minimal amount productive phlegm, greenish color. Also, she reported that in the past several days, she has been having pressure-like chest pain, moderate, intermittent associated with shortness of breath however at the time of admission, she did not have any chest pain. She denies any fevers or chills. She also felt that she had some skipped heartbeats as sometimes, mild epigastric pain and nausea. No vomiting or diarrhea.  Hospital Course:  Chest pain atypical likely due to acute bronchitis Completely resolved. EKG did not show ST-T wave changes suggestive of ischemia, chest x-ray did not show any pneumonia. Serial cardiac enzymes remained negative so far. Patient was continued on Protonix. 2-D echo showed EF of 83-66%, grade 1 diastolic dysfunction and  otherwise no regional wall motion abnormalities.  Cough with acute bronchitis: possibly causing chest tightness Influenza panel negative, respiratory virus panel still pending. Patient was placed on Levaquin, Mucinex and albuterol nebs which significantly improved her symptoms.   HTN (hypertension) - Continue atenolol   HLD (hyperlipidemia) - Continue pravastatin  Day of Discharge BP 136/45 mmHg  Pulse 66  Temp(Src) 97.4 F (36.3 C) (Oral)  Resp 18  Ht 5\' 8"  (1.727 m)  Wt 80.7 kg (177 lb 14.6 oz)  BMI 27.06 kg/m2  SpO2 97%  Physical Exam: General: Alert and awake oriented x3 not in any acute distress. HEENT: anicteric sclera, pupils reactive to light and accommodation CVS: S1-S2 clear no murmur rubs or gallops Chest: clear to auscultation bilaterally, no wheezing rales or rhonchi Abdomen: soft nontender, nondistended, normal bowel sounds Extremities: no cyanosis, clubbing or edema noted bilaterally Neuro: Cranial nerves II-XII intact, no focal neurological deficits   The results of significant diagnostics from this hospitalization (including imaging, microbiology, ancillary and laboratory) are listed below for reference.    LAB RESULTS: Basic Metabolic Panel:  Recent Labs Lab 05/02/14 0527 05/03/14 0524  NA 142 142  K 4.2 4.0  CL 109 108  CO2 24 24  GLUCOSE 94 100*  BUN 13 14  CREATININE 0.71 0.92  CALCIUM 8.9 9.0   Liver Function Tests:  Recent Labs Lab 05/01/14 1854  AST 22  ALT 19  ALKPHOS 63  BILITOT 0.5  PROT 7.3  ALBUMIN 3.9    Recent Labs Lab 05/01/14 1854  LIPASE 35   No results for input(s): AMMONIA in the last 168 hours. CBC:  Recent Labs Lab 05/01/14 1854 05/02/14 0527 05/03/14 0524  WBC 6.9 6.9 5.7  NEUTROABS 3.7  --   --   HGB 14.3 12.9 12.9  HCT 42.8 39.2 39.1  MCV 89.7 89.9 89.5  PLT 295 243 232   Cardiac Enzymes:  Recent Labs Lab 05/02/14 0527 05/02/14 1235  TROPONINI <0.03 <0.03   BNP: Invalid input(s):  POCBNP CBG: No results for input(s): GLUCAP in the last 168 hours.  Significant Diagnostic Studies:  Dg Chest 2 View  05/01/2014   CLINICAL DATA:  Midsternal chest pain, history of hypertension and right breast cancer with mastectomy. Nonsmoker.  EXAM: CHEST  2 VIEW  COMPARISON:  10/10/2013  FINDINGS: Mild lingular opacity, favor atelectasis or scarring. Lungs are clear otherwise. Mild aortic atherosclerotic calcification. There may be a small hiatal hernia. Heart size upper normal. Mediastinal contours otherwise within normal range. No pleural effusion or pneumothorax. Right mastectomy. Multilevel degenerative change.  IMPRESSION: Mild linear lingular opacity is favored to reflect atelectasis or scarring.  Electronically Signed   By: Carlos Levering M.D.   On: 05/01/2014 21:40    2D ECHO: Study Conclusions  - Left ventricle: The cavity size was normal. There was mild focal basal hypertrophy of the septum. Systolic function was normal. The estimated ejection fraction was in the range of 60% to 65%. Wall motion was normal; there were no regional wall motion abnormalities. Doppler parameters are consistent with abnormal left ventricular relaxation (grade 1 diastolic dysfunction). There was no evidence of elevated ventricular filling pressure by Doppler parameters. - Aortic valve: Structurally normal valve. There was mild regurgitation. - Mitral valve: Structurally normal valve. There was trivial regurgitation. - Right ventricle: The cavity size was normal. Wall thickness was normal. Systolic function was normal. - Right atrium: The atrium was normal in size. - Tricuspid valve: There was mild regurgitation. - Pulmonic valve: There was no regurgitation. - Pulmonary arteries: Systolic pressure was within the normal range. - Pericardium, extracardiac: A trivial pericardial effusion was identified posterior to the heart. Features were not consistent with  tamponade physiology.  Disposition and Follow-up: Discharge Instructions    Diet - low sodium heart healthy    Complete by:  As directed      Increase activity slowly    Complete by:  As directed             DISPOSITION: Home   DISCHARGE FOLLOW-UP Follow-up Information    Follow up with Dwan Bolt, MD. Schedule an appointment as soon as possible for a visit in 10 days.   Specialty:  Endocrinology   Why:  for hospital follow-up   Contact information:   [address] [place] [address] [phone]       Follow up with Dwan Bolt, MD. Go on 05/13/2014.   Specialty:  Endocrinology   Why:  follow up appt May 2 at 2 pm   Contact information:   [address] [place] [address] [phone]        Time spent on Discharge: 35 minutes  Signed:   Labresha Mellor M.D. [facility] 05/03/2014, 11:31 AM Pager: [phone]

## 2014-05-03 NOTE — Progress Notes (Signed)
Discharge education completed by RN. Pt and son received a copy of discharge paperwork and confirm understanding of follow up appointments and discharge medications. Both deny any questions at this time. IV removed, site is within normal limits. Pt will discharge from the unit via wheelchair. 

## 2014-05-04 LAB — RESPIRATORY VIRUS PANEL
ADENOVIRUS: NEGATIVE
INFLUENZA A: NEGATIVE
Influenza B: NEGATIVE
Metapneumovirus: NEGATIVE
PARAINFLUENZA 3 A: NEGATIVE
Parainfluenza 1: NEGATIVE
Parainfluenza 2: NEGATIVE
Respiratory Syncytial Virus A: NEGATIVE
Respiratory Syncytial Virus B: NEGATIVE
Rhinovirus: NEGATIVE

## 2015-02-06 DIAGNOSIS — H401114 Primary open-angle glaucoma, right eye, indeterminate stage: Secondary | ICD-10-CM | POA: Diagnosis not present

## 2015-02-06 DIAGNOSIS — H26491 Other secondary cataract, right eye: Secondary | ICD-10-CM | POA: Diagnosis not present

## 2015-02-06 DIAGNOSIS — H401124 Primary open-angle glaucoma, left eye, indeterminate stage: Secondary | ICD-10-CM | POA: Diagnosis not present

## 2015-02-06 DIAGNOSIS — H2512 Age-related nuclear cataract, left eye: Secondary | ICD-10-CM | POA: Diagnosis not present

## 2015-03-05 DIAGNOSIS — H10013 Acute follicular conjunctivitis, bilateral: Secondary | ICD-10-CM | POA: Diagnosis not present

## 2015-03-05 DIAGNOSIS — H401121 Primary open-angle glaucoma, left eye, mild stage: Secondary | ICD-10-CM | POA: Diagnosis not present

## 2015-03-05 DIAGNOSIS — H26491 Other secondary cataract, right eye: Secondary | ICD-10-CM | POA: Diagnosis not present

## 2015-03-05 DIAGNOSIS — H401111 Primary open-angle glaucoma, right eye, mild stage: Secondary | ICD-10-CM | POA: Diagnosis not present

## 2015-03-06 ENCOUNTER — Other Ambulatory Visit: Payer: Self-pay

## 2015-03-06 DIAGNOSIS — Z1231 Encounter for screening mammogram for malignant neoplasm of breast: Secondary | ICD-10-CM

## 2015-03-14 DIAGNOSIS — K219 Gastro-esophageal reflux disease without esophagitis: Secondary | ICD-10-CM | POA: Diagnosis not present

## 2015-03-20 DIAGNOSIS — H26491 Other secondary cataract, right eye: Secondary | ICD-10-CM | POA: Diagnosis not present

## 2015-03-25 ENCOUNTER — Ambulatory Visit
Admission: RE | Admit: 2015-03-25 | Discharge: 2015-03-25 | Disposition: A | Discharge status: Home / Self Care | Payer: PPO | Source: Ambulatory Visit

## 2015-03-25 DIAGNOSIS — Z1231 Encounter for screening mammogram for malignant neoplasm of breast: Secondary | ICD-10-CM

## 2015-04-10 DIAGNOSIS — Z09 Encounter for follow-up examination after completed treatment for conditions other than malignant neoplasm: Secondary | ICD-10-CM | POA: Diagnosis not present

## 2015-04-10 DIAGNOSIS — K5730 Diverticulosis of large intestine without perforation or abscess without bleeding: Secondary | ICD-10-CM | POA: Diagnosis not present

## 2015-04-10 DIAGNOSIS — Z86010 Personal history of colonic polyps: Secondary | ICD-10-CM | POA: Diagnosis not present

## 2015-04-10 DIAGNOSIS — K5790 Diverticulosis of intestine, part unspecified, without perforation or abscess without bleeding: Secondary | ICD-10-CM | POA: Diagnosis not present

## 2015-04-10 DIAGNOSIS — R1013 Epigastric pain: Secondary | ICD-10-CM | POA: Diagnosis not present

## 2015-04-10 DIAGNOSIS — K219 Gastro-esophageal reflux disease without esophagitis: Secondary | ICD-10-CM | POA: Diagnosis not present

## 2015-04-10 DIAGNOSIS — R110 Nausea: Secondary | ICD-10-CM | POA: Diagnosis not present

## 2015-05-07 DIAGNOSIS — I10 Essential (primary) hypertension: Secondary | ICD-10-CM | POA: Diagnosis not present

## 2015-05-07 DIAGNOSIS — J329 Chronic sinusitis, unspecified: Secondary | ICD-10-CM | POA: Diagnosis not present

## 2015-05-08 DIAGNOSIS — H10013 Acute follicular conjunctivitis, bilateral: Secondary | ICD-10-CM | POA: Diagnosis not present

## 2015-05-08 DIAGNOSIS — H401121 Primary open-angle glaucoma, left eye, mild stage: Secondary | ICD-10-CM | POA: Diagnosis not present

## 2015-05-08 DIAGNOSIS — H401111 Primary open-angle glaucoma, right eye, mild stage: Secondary | ICD-10-CM | POA: Diagnosis not present

## 2015-09-03 DIAGNOSIS — M5134 Other intervertebral disc degeneration, thoracic region: Secondary | ICD-10-CM | POA: Diagnosis not present

## 2015-09-03 DIAGNOSIS — I10 Essential (primary) hypertension: Secondary | ICD-10-CM | POA: Diagnosis not present

## 2015-09-03 DIAGNOSIS — E789 Disorder of lipoprotein metabolism, unspecified: Secondary | ICD-10-CM | POA: Diagnosis not present

## 2015-09-03 DIAGNOSIS — R079 Chest pain, unspecified: Secondary | ICD-10-CM | POA: Diagnosis not present

## 2015-09-03 DIAGNOSIS — M549 Dorsalgia, unspecified: Secondary | ICD-10-CM | POA: Diagnosis not present

## 2015-09-03 DIAGNOSIS — N390 Urinary tract infection, site not specified: Secondary | ICD-10-CM | POA: Diagnosis not present

## 2015-09-09 DIAGNOSIS — H401121 Primary open-angle glaucoma, left eye, mild stage: Secondary | ICD-10-CM | POA: Diagnosis not present

## 2015-09-09 DIAGNOSIS — H401111 Primary open-angle glaucoma, right eye, mild stage: Secondary | ICD-10-CM | POA: Diagnosis not present

## 2015-10-22 DIAGNOSIS — N390 Urinary tract infection, site not specified: Secondary | ICD-10-CM | POA: Diagnosis not present

## 2015-10-22 DIAGNOSIS — F5101 Primary insomnia: Secondary | ICD-10-CM | POA: Diagnosis not present

## 2015-10-22 DIAGNOSIS — E789 Disorder of lipoprotein metabolism, unspecified: Secondary | ICD-10-CM | POA: Diagnosis not present

## 2015-12-26 ENCOUNTER — Ambulatory Visit (INDEPENDENT_AMBULATORY_CARE_PROVIDER_SITE_OTHER): Payer: PPO

## 2015-12-26 ENCOUNTER — Encounter (HOSPITAL_COMMUNITY): Payer: Self-pay | Admitting: *Deleted

## 2015-12-26 ENCOUNTER — Ambulatory Visit (HOSPITAL_COMMUNITY)
Admission: EM | Admit: 2015-12-26 | Discharge: 2015-12-26 | Disposition: A | Discharge status: Home / Self Care | Payer: PPO | Attending: Internal Medicine | Admitting: Internal Medicine

## 2015-12-26 DIAGNOSIS — S82452A Displaced comminuted fracture of shaft of left fibula, initial encounter for closed fracture: Secondary | ICD-10-CM | POA: Diagnosis not present

## 2015-12-26 DIAGNOSIS — I10 Essential (primary) hypertension: Secondary | ICD-10-CM | POA: Diagnosis not present

## 2015-12-26 DIAGNOSIS — Z0000 Encounter for general adult medical examination without abnormal findings: Secondary | ICD-10-CM | POA: Diagnosis not present

## 2015-12-26 DIAGNOSIS — S82832A Other fracture of upper and lower end of left fibula, initial encounter for closed fracture: Secondary | ICD-10-CM | POA: Diagnosis not present

## 2015-12-26 DIAGNOSIS — Z79899 Other long term (current) drug therapy: Secondary | ICD-10-CM | POA: Diagnosis not present

## 2015-12-26 DIAGNOSIS — E789 Disorder of lipoprotein metabolism, unspecified: Secondary | ICD-10-CM | POA: Diagnosis not present

## 2015-12-26 MED ORDER — HYDROCODONE-ACETAMINOPHEN 5-325 MG PO TABS: 2.0000 | ORAL_TABLET | ORAL | 0 refills | Status: DC | PRN

## 2015-12-26 NOTE — ED Provider Notes (Signed)
CSN: [id number]     Arrival date & time 12/26/15  1002 History   None    Chief Complaint  Patient presents with  . Fall   (Consider location/radiation/quality/duration/timing/severity/associated sxs/prior Treatment) Patient fell on ice about a week ago and she injured her left ankle and the ankle is not better and the pain is severe and she has swelling.    Ankle Pain  Location:  Ankle Time since incident:  1 week Injury: yes   Ankle location:  L ankle Pain details:    Quality:  Aching   Radiates to:  Does not radiate   Severity:  Severe   Onset quality:  Sudden   Duration:  1 week   Timing:  Constant   Progression:  Worsening Chronicity:  New Dislocation: no   Foreign body present:  No foreign bodies Tetanus status:  Out of date Prior injury to area:  No Relieved by:  Nothing Worsened by:  Nothing Ineffective treatments:  None tried   Past Medical History:  Diagnosis Date  . Arthritis    "maybe a little bit in my knees" (05/02/2014)  . Breast cancer, right breast ([place])   . Glaucoma of both eyes   . HLD (hyperlipidemia)   . Hypertension    Past Surgical History:  Procedure Laterality Date  . ABDOMINAL HYSTERECTOMY  09/1971  . BREAST BIOPSY Right 08/1970  . CATARACT EXTRACTION W/ INTRAOCULAR LENS IMPLANT Right 04/2013  . MASTECTOMY Right 08/1970  . TONSILLECTOMY    . TYMPANOPLASTY Bilateral ~ 1963   "had eardrums patched w/vein from my arms"   Family History  Problem Relation Age of Onset  . Glaucoma Mother   . Hyperthyroidism Mother   . Heart disease Brother   . Rheumatic fever Sister   . Arthritis Sister    Social History  Substance Use Topics  . Smoking status: Never Smoker  . Smokeless tobacco: Never Used  . Alcohol use No   OB History    No data available     Review of Systems  Constitutional: Negative.   HENT: Negative.   Eyes: Negative.   Respiratory: Negative.   Cardiovascular: Negative.   Gastrointestinal: Negative.   Endocrine:  Negative.   Genitourinary: Negative.   Musculoskeletal: Positive for arthralgias.  Skin: Negative.   Allergic/Immunologic: Negative.   Neurological: Negative.   Hematological: Negative.   Psychiatric/Behavioral: Negative.     Allergies  Penicillins  Home Medications   Prior to Admission medications   Medication Sig Start Date End Date Taking? Authorizing Provider  albuterol (PROVENTIL HFA;VENTOLIN HFA) 108 (90 BASE) MCG/ACT inhaler Inhale 2 puffs into the lungs every 6 (six) hours as needed for wheezing or shortness of breath. 05/03/14   Ripudeep Krystal Eaton, MD  Ascorbic Acid (VITAMIN C) 100 MG tablet Take 100 mg by mouth daily.    Historical Provider, MD  aspirin 81 MG tablet Take 81 mg by mouth daily.    Historical Provider, MD  atenolol (TENORMIN) 25 MG tablet Take 25 mg by mouth daily.    Historical Provider, MD  bimatoprost (LUMIGAN) 0.01 % SOLN Place 1 drop into both eyes at bedtime.    Historical Provider, MD  brimonidine-timolol (COMBIGAN) 0.2-0.5 % ophthalmic solution Place 1 drop into both eyes every 12 (twelve) hours.    Historical Provider, MD  cholecalciferol (VITAMIN D) 1000 UNITS tablet Take 1,000 Units by mouth daily.    Historical Provider, MD  dextromethorphan-guaiFENesin (MUCINEX DM) 30-600 MG per 12 hr tablet Take 1 tablet  by mouth 2 (two) times daily as needed for cough. 05/03/14   Ripudeep Krystal Eaton, MD  Fish Oil-Cholecalciferol (FISH OIL + D3 PO) Take 1,000 mg by mouth daily.    Historical Provider, MD  levofloxacin (LEVAQUIN) 750 MG tablet Take 1 tablet (750 mg total) by mouth daily. X 5 days 05/04/14   Ripudeep Krystal Eaton, MD  loratadine (CLARITIN) 10 MG tablet Take 1 tablet (10 mg total) by mouth daily. 05/03/14   Ripudeep Krystal Eaton, MD  Misc Natural Products ([employer] GLUCOS-CHONDROIT-MSM DS) TABS Take 1 tablet by mouth daily.    Historical Provider, MD  pravastatin (PRAVACHOL) 40 MG tablet Take 1 tablet (40 mg total) by mouth daily. 10/11/13   Virginia Crews, MD  promethazine  (PHENERGAN) 12.5 MG tablet Take 1 tablet (12.5 mg total) by mouth every 6 (six) hours as needed for nausea or vomiting. 05/03/14   Ripudeep Krystal Eaton, MD  saccharomyces boulardii (FLORASTOR) 250 MG capsule Take 1 capsule (250 mg total) by mouth 2 (two) times daily. Take probiotic while on antibiotic 05/03/14   Ripudeep Krystal Eaton, MD  vitamin E 100 UNIT capsule Take 100 Units by mouth daily.    Historical Provider, MD  zolpidem (AMBIEN) 10 MG tablet Take 5 mg by mouth at bedtime as needed for sleep.    Historical Provider, MD   Meds Ordered and Administered this Visit  Medications - No data to display  BP 138/80 (BP Location: Left Arm)   Pulse (!) 51   Temp 97.7 F (36.5 C) (Oral)   Resp 16   SpO2 99%  No data found.   Physical Exam  Constitutional: She appears well-developed and well-nourished.  HENT:  Head: Normocephalic and atraumatic.  Eyes: Conjunctivae and EOM are normal. Pupils are equal, round, and reactive to light.  Neck: Normal range of motion. Neck supple.  Cardiovascular: Normal rate, regular rhythm and normal heart sounds.   Pulmonary/Chest: Effort normal.  Musculoskeletal:  Left ankle swollen and TTP left lateral malleolus  Nursing note and vitals reviewed.   Urgent Care Course   Clinical Course     Procedures (including critical care time)  Labs Review Labs Reviewed - No data to display  Imaging Review Dg Ankle Complete Left  Result Date: 12/26/2015 CLINICAL DATA:  Status post fall 1 week ago. Injured left foot and ankle. EXAM: LEFT ANKLE COMPLETE - 3+ VIEW; LEFT FOOT - COMPLETE 3+ VIEW COMPARISON:  None. FINDINGS: Mildly comminuted nondisplaced fracture of the left distal fibular metaphysis with overlying soft tissue swelling. Intact ankle mortise. No other fracture or dislocation. Mild osteoarthritis of the talonavicular joint. Small plantar calcaneal spur. Severe soft tissue edema along the dorsal aspect of the forefoot. IMPRESSION: 1. Mildly comminuted  nondisplaced fracture of the left distal fibular metaphysis with overlying soft tissue swelling. 2. Severe soft tissue edema along the dorsal aspect of the forefoot. Electronically Signed   By: Kathreen Devoid   On: 12/26/2015 11:08   Dg Foot Complete Left  Result Date: 12/26/2015 CLINICAL DATA:  Status post fall 1 week ago. Injured left foot and ankle. EXAM: LEFT ANKLE COMPLETE - 3+ VIEW; LEFT FOOT - COMPLETE 3+ VIEW COMPARISON:  None. FINDINGS: Mildly comminuted nondisplaced fracture of the left distal fibular metaphysis with overlying soft tissue swelling. Intact ankle mortise. No other fracture or dislocation. Mild osteoarthritis of the talonavicular joint. Small plantar calcaneal spur. Severe soft tissue edema along the dorsal aspect of the forefoot. IMPRESSION: 1. Mildly comminuted nondisplaced fracture of the  left distal fibular metaphysis with overlying soft tissue swelling. 2. Severe soft tissue edema along the dorsal aspect of the forefoot. Electronically Signed   By: Kathreen Devoid   On: 12/26/2015 11:08     Visual Acuity Review  Right Eye Distance:   Left Eye Distance:   Bilateral Distance:    Right Eye Near:   Left Eye Near:    Bilateral Near:         MDM  Fractured left fibula Cam walker Hydrocodone 5/325 one po q 6 hours prn #16 Dr. Sharol Given Next week and please call      Lysbeth Penner, FNP 12/26/15 1152

## 2015-12-26 NOTE — Discharge Instructions (Signed)
Fibular Ankle Fracture Treated With or Without Immobilization, Adult A fibular fracture at your ankle is a break (fracture) bone in the smallest of the two bones in your lower leg, located on the outside of your leg (fibula) close to the area at your ankle joint. CAUSES Rolling your ankle. Twisting your ankle. Extreme flexing or extending of your foot. Severe force on your ankle as when falling from a distance. RISK FACTORS Jumping activities. Participation in sports. Osteoporosis. Advanced age. Previous ankle injuries. SIGNS AND SYMPTOMS Pain. Swelling. Inability to put weight on injured ankle. Bruising. Bone deformities at site of injury. DIAGNOSIS  This fracture is diagnosed with the help of an X-ray exam. TREATMENT  If the fractured bone did not move out of place it usually will heal without problems and does casting or splinting. If immobilization is needed for comfort or the fractured bone moved out of place and will not heal properly with immobilization, a cast or splint will be used. HOME CARE INSTRUCTIONS  Apply ice to the area of injury: Put ice in a plastic bag. Place a towel between your skin and the bag. Leave the ice on for 20 minutes, 2-3 times a day. Use crutches as directed. Resume walking without crutches as directed by your health care provider. Only take over-the-counter or prescription medicines for pain, discomfort, or fever as directed by your health care provider. If you have a removable splint or boot, do not remove the boot unless directed by your health care provider. SEEK MEDICAL CARE IF:  You have continued pain or more swelling The medications do not control the pain. SEEK IMMEDIATE MEDICAL CARE IF: You develop severe pain in the leg or foot. Your skin or nails below the injury turn blue or grey or feel cold or numb. MAKE SURE YOU:  Understand these instructions. Will watch your condition. Will get help right away if you are not doing well or get  worse. This information is not intended to replace advice given to you by your health care provider. Make sure you discuss any questions you have with your health care provider. Document Released: 12/28/2004 Document Revised: 01/18/2014 Document Reviewed: 08/09/2012 Elsevier Interactive Patient Education  2017 [employer].

## 2015-12-26 NOTE — ED Triage Notes (Addendum)
Pt   Reports       She   Fell  1  Week   Ago     In  [employer]      Pain  In  l  lef  Especially    The  l  Ankle   Swelling  And  Bruising  Of the  Left  leg

## 2016-01-22 DIAGNOSIS — S82832D Other fracture of upper and lower end of left fibula, subsequent encounter for closed fracture with routine healing: Secondary | ICD-10-CM | POA: Diagnosis not present

## 2016-02-17 ENCOUNTER — Other Ambulatory Visit: Payer: Self-pay | Admitting: Endocrinology

## 2016-02-17 DIAGNOSIS — Z1231 Encounter for screening mammogram for malignant neoplasm of breast: Secondary | ICD-10-CM

## 2016-02-19 DIAGNOSIS — S82832D Other fracture of upper and lower end of left fibula, subsequent encounter for closed fracture with routine healing: Secondary | ICD-10-CM | POA: Diagnosis not present

## 2016-03-18 DIAGNOSIS — S82832D Other fracture of upper and lower end of left fibula, subsequent encounter for closed fracture with routine healing: Secondary | ICD-10-CM | POA: Diagnosis not present

## 2016-03-24 DIAGNOSIS — H2512 Age-related nuclear cataract, left eye: Secondary | ICD-10-CM | POA: Diagnosis not present

## 2016-03-24 DIAGNOSIS — H401131 Primary open-angle glaucoma, bilateral, mild stage: Secondary | ICD-10-CM | POA: Diagnosis not present

## 2016-03-24 DIAGNOSIS — H25012 Cortical age-related cataract, left eye: Secondary | ICD-10-CM | POA: Diagnosis not present

## 2016-03-24 DIAGNOSIS — H35363 Drusen (degenerative) of macula, bilateral: Secondary | ICD-10-CM | POA: Diagnosis not present

## 2016-03-25 ENCOUNTER — Ambulatory Visit
Admission: RE | Admit: 2016-03-25 | Discharge: 2016-03-25 | Disposition: A | Discharge status: Home / Self Care | Payer: PPO | Source: Ambulatory Visit | Attending: Endocrinology | Admitting: Endocrinology

## 2016-03-25 DIAGNOSIS — Z1231 Encounter for screening mammogram for malignant neoplasm of breast: Secondary | ICD-10-CM | POA: Diagnosis not present

## 2016-05-13 DIAGNOSIS — I10 Essential (primary) hypertension: Secondary | ICD-10-CM | POA: Diagnosis not present

## 2016-05-13 DIAGNOSIS — E789 Disorder of lipoprotein metabolism, unspecified: Secondary | ICD-10-CM | POA: Diagnosis not present

## 2016-05-13 DIAGNOSIS — Z79899 Other long term (current) drug therapy: Secondary | ICD-10-CM | POA: Diagnosis not present

## 2016-05-13 DIAGNOSIS — Z0000 Encounter for general adult medical examination without abnormal findings: Secondary | ICD-10-CM | POA: Diagnosis not present

## 2016-05-13 DIAGNOSIS — M810 Age-related osteoporosis without current pathological fracture: Secondary | ICD-10-CM | POA: Diagnosis not present

## 2016-05-20 DIAGNOSIS — I10 Essential (primary) hypertension: Secondary | ICD-10-CM | POA: Diagnosis not present

## 2016-05-20 DIAGNOSIS — R899 Unspecified abnormal finding in specimens from other organs, systems and tissues: Secondary | ICD-10-CM | POA: Diagnosis not present

## 2016-05-20 DIAGNOSIS — E789 Disorder of lipoprotein metabolism, unspecified: Secondary | ICD-10-CM | POA: Diagnosis not present

## 2016-05-20 DIAGNOSIS — Z0000 Encounter for general adult medical examination without abnormal findings: Secondary | ICD-10-CM | POA: Diagnosis not present

## 2016-06-22 DIAGNOSIS — J029 Acute pharyngitis, unspecified: Secondary | ICD-10-CM | POA: Diagnosis not present

## 2016-09-09 IMAGING — MG MM SCREENING MAMMOGRAM LEFT
2 series · 2 of 2 positions shown · non-contrast
Comparison: Previous exam(s).

CLINICAL DATA: Screening.

EXAM:
DIGITAL SCREENING UNILATERAL LEFT MAMMOGRAM WITH CAD

[L CC]
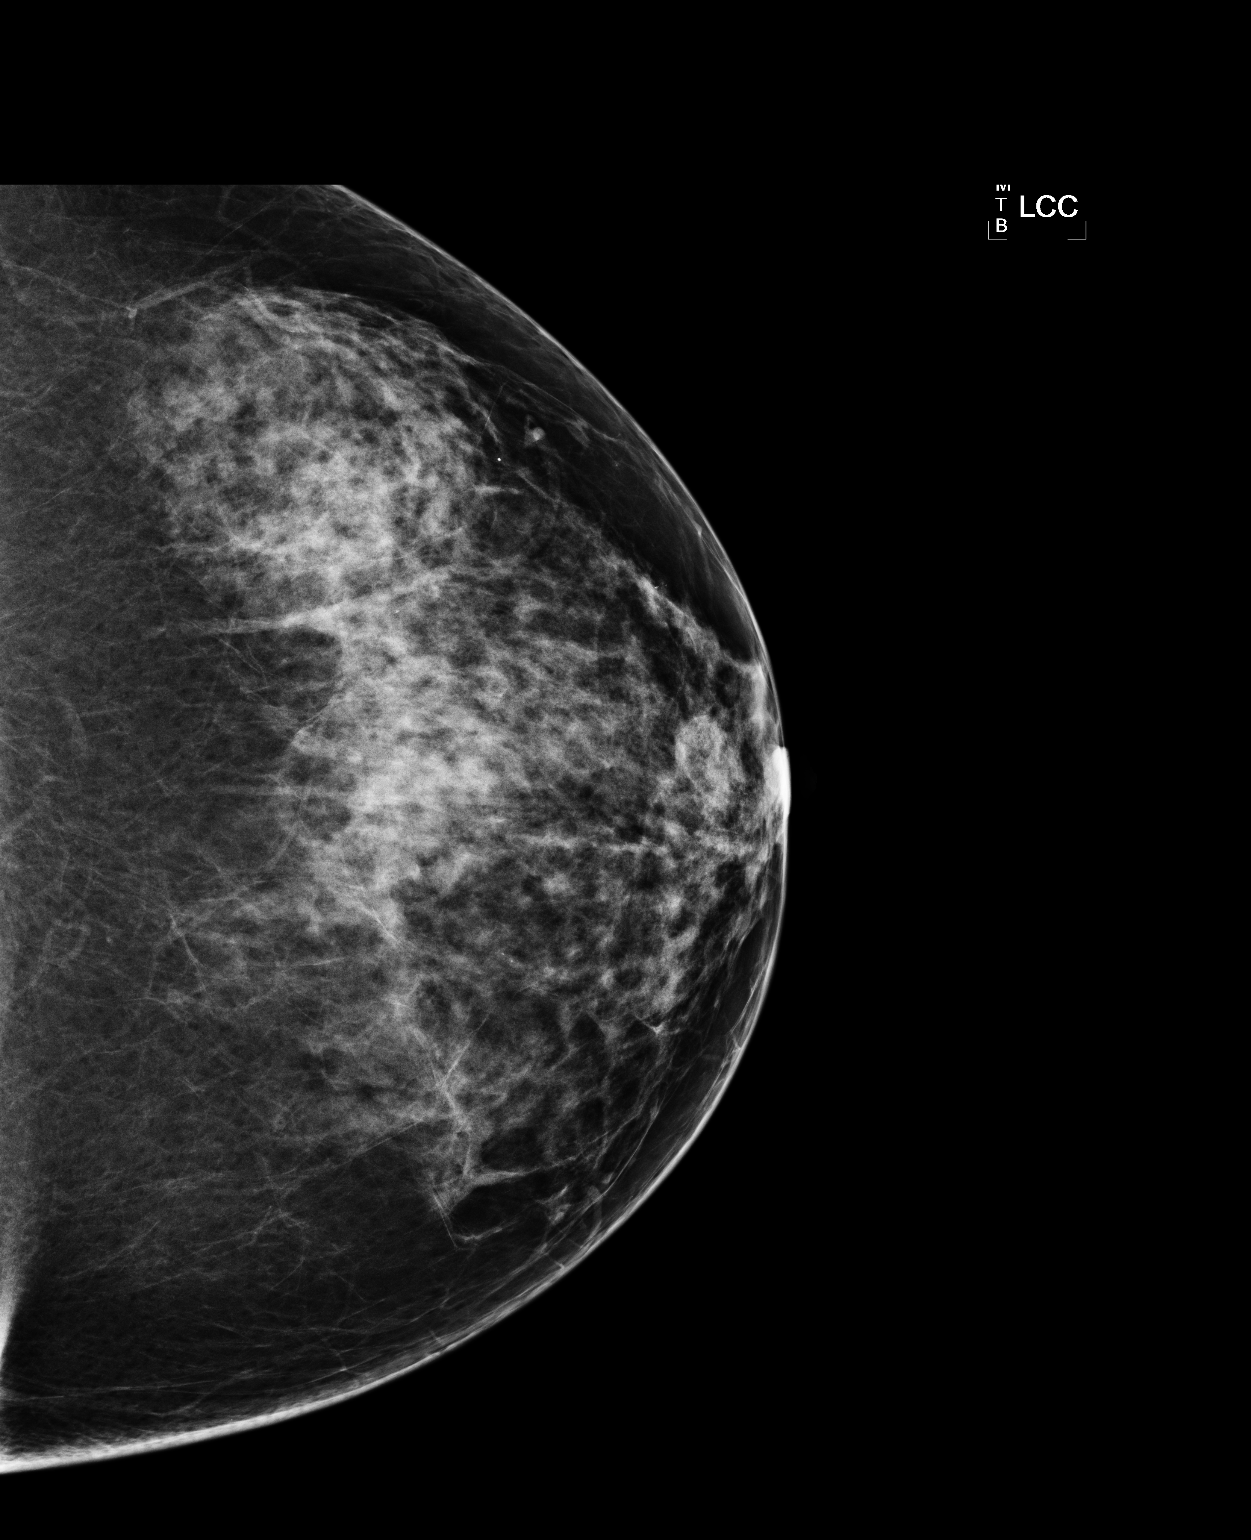

[L MLO]
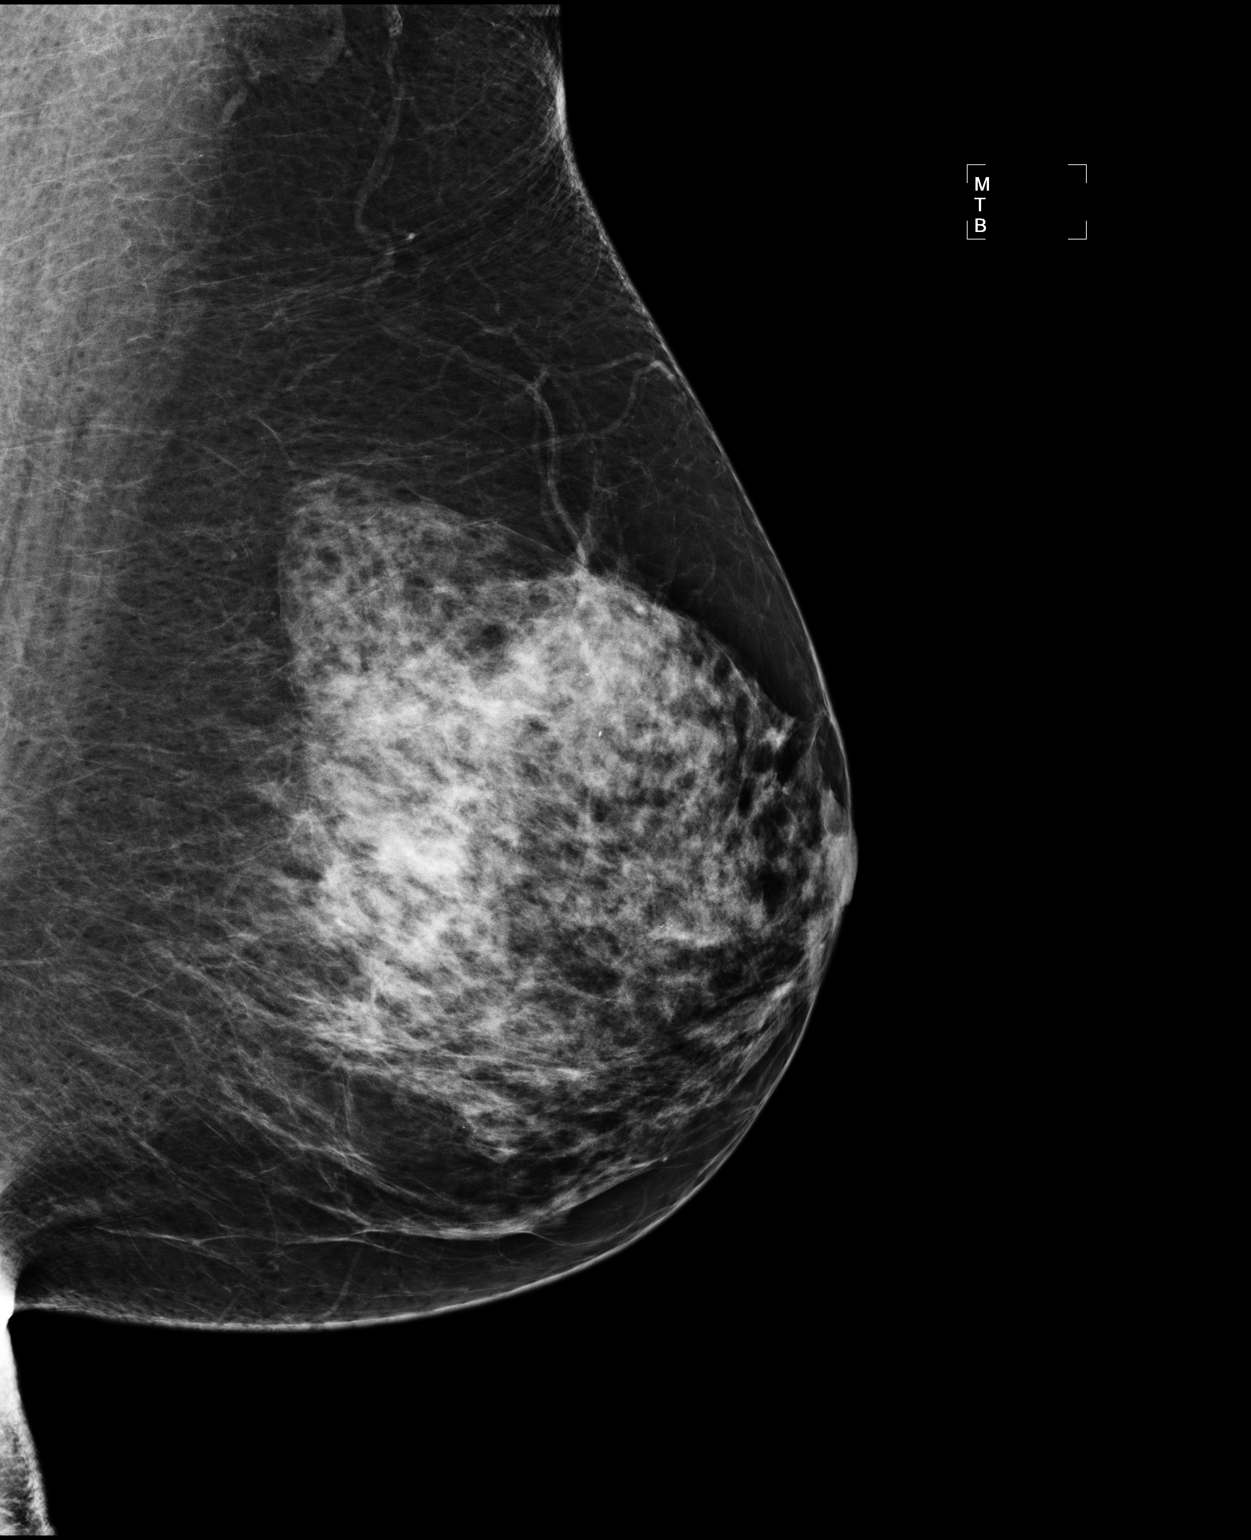

[2 of 2 positions shown; findings below may reference images not displayed]

ACR Breast Density Category c: The breast tissue is heterogeneously
dense, which may obscure small masses.
FINDINGS: There are no findings suspicious for malignancy. Images were
processed with CAD.
IMPRESSION: No mammographic evidence of malignancy. A result letter of this
screening mammogram will be mailed directly to the patient.

RECOMMENDATION:
Screening mammogram in one year. (Code:[id number]-E-TS4)

BI-RADS CATEGORY  1: Negative.

## 2016-09-22 ENCOUNTER — Emergency Department (HOSPITAL_COMMUNITY): Payer: PPO

## 2016-09-22 ENCOUNTER — Encounter (HOSPITAL_COMMUNITY): Payer: Self-pay | Admitting: Emergency Medicine

## 2016-09-22 ENCOUNTER — Emergency Department (HOSPITAL_COMMUNITY)
Admission: EM | Admit: 2016-09-22 | Discharge: 2016-09-22 | Disposition: A | Discharge status: Home / Self Care | Payer: PPO | Attending: Emergency Medicine | Admitting: Emergency Medicine

## 2016-09-22 DIAGNOSIS — R001 Bradycardia, unspecified: Secondary | ICD-10-CM | POA: Diagnosis not present

## 2016-09-22 DIAGNOSIS — I10 Essential (primary) hypertension: Secondary | ICD-10-CM | POA: Insufficient documentation

## 2016-09-22 DIAGNOSIS — R110 Nausea: Secondary | ICD-10-CM | POA: Insufficient documentation

## 2016-09-22 DIAGNOSIS — Z7982 Long term (current) use of aspirin: Secondary | ICD-10-CM | POA: Insufficient documentation

## 2016-09-22 DIAGNOSIS — Z79899 Other long term (current) drug therapy: Secondary | ICD-10-CM | POA: Insufficient documentation

## 2016-09-22 DIAGNOSIS — R109 Unspecified abdominal pain: Secondary | ICD-10-CM | POA: Insufficient documentation

## 2016-09-22 DIAGNOSIS — R0602 Shortness of breath: Secondary | ICD-10-CM | POA: Insufficient documentation

## 2016-09-22 DIAGNOSIS — Z853 Personal history of malignant neoplasm of breast: Secondary | ICD-10-CM | POA: Insufficient documentation

## 2016-09-22 LAB — CBC
HCT: 42 % (ref 36.0–46.0)
Hemoglobin: 13.6 g/dL (ref 12.0–15.0)
MCH: 29.4 pg (ref 26.0–34.0)
MCHC: 32.4 g/dL (ref 30.0–36.0)
MCV: 90.7 fL (ref 78.0–100.0)
Platelets: 266 10*3/uL (ref 150–400)
RBC: 4.63 MIL/uL (ref 3.87–5.11)
RDW: 13.7 % (ref 11.5–15.5)
WBC: 6.5 10*3/uL (ref 4.0–10.5)

## 2016-09-22 LAB — URINALYSIS, ROUTINE W REFLEX MICROSCOPIC
Bilirubin Urine: NEGATIVE
GLUCOSE, UA: NEGATIVE mg/dL
Hgb urine dipstick: NEGATIVE
Ketones, ur: NEGATIVE mg/dL
LEUKOCYTES UA: NEGATIVE
NITRITE: NEGATIVE
PH: 5 (ref 5.0–8.0)
PROTEIN: NEGATIVE mg/dL
Specific Gravity, Urine: 1.003 — ABNORMAL LOW (ref 1.005–1.030)

## 2016-09-22 LAB — BASIC METABOLIC PANEL
Anion gap: 7 (ref 5–15)
BUN: 15 mg/dL (ref 6–20)
CO2: 25 mmol/L (ref 22–32)
Calcium: 9.5 mg/dL (ref 8.9–10.3)
Chloride: 106 mmol/L (ref 101–111)
Creatinine, Ser: 0.89 mg/dL (ref 0.44–1.00)
GFR calc Af Amer: >60 mL/min (ref >60)
GFR calc non Af Amer: 60 mL/min — ABNORMAL LOW (ref >60)
Glucose, Bld: 108 mg/dL — ABNORMAL HIGH (ref 65–99)
Potassium: 4.3 mmol/L (ref 3.5–5.1)
Sodium: 138 mmol/L (ref 135–145)

## 2016-09-22 LAB — I-STAT TROPONIN, ED: Troponin i, poc: 0 ng/mL (ref 0.00–0.08)

## 2016-09-22 NOTE — ED Provider Notes (Signed)
[facility] DEPT Provider Note   CSN: [id number] Arrival date & time: 09/22/16  1138     History   Chief Complaint Chief Complaint  Patient presents with  . Shortness of Breath  . Back Pain  . Nausea    HPI Amy Tapia is a 83 y.o. female.  Patient with past history of hysterectomy presents with complaint of right flank pain starting 2 weeks ago but gradually worsening over the past few days. Pain is worse with movement and palpation. No urinary symptoms. Pain has been bothersome, causing the patient to schedule an appointment with her doctor. She thought it was today and she went to the office, but found out that it was tomorrow. Instead of going home, she felt like going to the emergency department will be a good idea. This morning, and only this morning, patient had some mild shortness of breath and some nausea. She reported this had arrival. No diaphoresis, chest pains. Currently none of the symptoms at the current time. Patient takes she just might of been anxious. The onset of this condition was acute.       Past Medical History:  Diagnosis Date  . Arthritis    "maybe a little bit in my knees" (05/02/2014)  . Breast cancer, right breast ([place])   . Glaucoma of both eyes   . HLD (hyperlipidemia)   . Hypertension     Patient Active Problem List   Diagnosis Date Noted  . HTN (hypertension) 05/01/2014  . HLD (hyperlipidemia) 05/01/2014  . Nausea 05/01/2014  . Skipped heart beats 05/01/2014  . Cough 05/01/2014  . Pain in the chest   . Left arm pain 10/11/2013  . Chest pain 10/10/2013    Past Surgical History:  Procedure Laterality Date  . ABDOMINAL HYSTERECTOMY  09/1971  . BREAST BIOPSY Right 08/1970  . CATARACT EXTRACTION W/ INTRAOCULAR LENS IMPLANT Right 04/2013  . MASTECTOMY Right 08/1970  . TONSILLECTOMY    . TYMPANOPLASTY Bilateral ~ 1963   "had eardrums patched w/vein from my arms"    OB History    No data available       Home Medications     Prior to Admission medications   Medication Sig Start Date End Date Taking? Authorizing Provider  albuterol (PROVENTIL HFA;VENTOLIN HFA) 108 (90 BASE) MCG/ACT inhaler Inhale 2 puffs into the lungs every 6 (six) hours as needed for wheezing or shortness of breath. 05/03/14   Rai, Ripudeep K, MD  Ascorbic Acid (VITAMIN C) 100 MG tablet Take 100 mg by mouth daily.    [provider]  aspirin 81 MG tablet Take 81 mg by mouth daily.    [provider]  atenolol (TENORMIN) 25 MG tablet Take 25 mg by mouth daily.    [provider]  bimatoprost (LUMIGAN) 0.01 % SOLN Place 1 drop into both eyes at bedtime.    [provider]  brimonidine-timolol (COMBIGAN) 0.2-0.5 % ophthalmic solution Place 1 drop into both eyes every 12 (twelve) hours.    [provider]  cholecalciferol (VITAMIN D) 1000 UNITS tablet Take 1,000 Units by mouth daily.    [provider]  dextromethorphan-guaiFENesin (MUCINEX DM) 30-600 MG per 12 hr tablet Take 1 tablet by mouth 2 (two) times daily as needed for cough. 05/03/14   Rai, Vernelle Emerald, MD  Fish Oil-Cholecalciferol (FISH OIL + D3 PO) Take 1,000 mg by mouth daily.    [provider]  HYDROcodone-acetaminophen (NORCO/VICODIN) 5-325 MG tablet Take 2 tablets by mouth  every 4 (four) hours as needed. 12/26/15   Lysbeth Penner, FNP  levofloxacin (LEVAQUIN) 750 MG tablet Take 1 tablet (750 mg total) by mouth daily. X 5 days 05/04/14   Rai, Vernelle Emerald, MD  loratadine (CLARITIN) 10 MG tablet Take 1 tablet (10 mg total) by mouth daily. 05/03/14   Rai, Vernelle Emerald, MD  Misc Natural Products ([employer] GLUCOS-CHONDROIT-MSM DS) TABS Take 1 tablet by mouth daily.    [provider]  pravastatin (PRAVACHOL) 40 MG tablet Take 1 tablet (40 mg total) by mouth daily. 10/11/13   Virginia Crews, MD  promethazine (PHENERGAN) 12.5 MG tablet Take 1 tablet (12.5 mg total) by mouth every 6 (six) hours as needed for nausea or vomiting.  05/03/14   Rai, Vernelle Emerald, MD  saccharomyces boulardii (FLORASTOR) 250 MG capsule Take 1 capsule (250 mg total) by mouth 2 (two) times daily. Take probiotic while on antibiotic 05/03/14   Rai, Vernelle Emerald, MD  vitamin E 100 UNIT capsule Take 100 Units by mouth daily.    [provider]  zolpidem (AMBIEN) 10 MG tablet Take 5 mg by mouth at bedtime as needed for sleep.    [provider]    Family History Family History  Problem Relation Age of Onset  . Glaucoma Mother   . Hyperthyroidism Mother   . Heart disease Brother   . Rheumatic fever Sister   . Arthritis Sister     Social History Social History  Substance Use Topics  . Smoking status: Never Smoker  . Smokeless tobacco: Never Used  . Alcohol use No     Allergies   Penicillins   Review of Systems Review of Systems  Constitutional: Negative for fever.  HENT: Negative for rhinorrhea and sore throat.   Eyes: Negative for redness.  Respiratory: Positive for shortness of breath. Negative for cough.   Cardiovascular: Negative for chest pain.  Gastrointestinal: Positive for nausea. Negative for abdominal pain, diarrhea and vomiting.  Genitourinary: Positive for flank pain. Negative for dysuria.  Musculoskeletal: Positive for back pain. Negative for myalgias.  Skin: Negative for rash.  Neurological: Negative for headaches.     Physical Exam Updated Vital Signs BP (!) 137/53 (BP Location: Right Arm)   Pulse (!) 54   Temp 98.4 F (36.9 C) (Oral)   Resp 14   Ht 5\' 8"  (1.727 m)   Wt 79.4 kg (175 lb)   SpO2 97%   BMI 26.61 kg/m   Physical Exam  Constitutional: She appears well-developed and well-nourished.  HENT:  Head: Normocephalic and atraumatic.  Mouth/Throat: Oropharynx is clear and moist.  Eyes: Conjunctivae are normal. Right eye exhibits no discharge. Left eye exhibits no discharge.  Neck: Normal range of motion. Neck supple.  Cardiovascular: Normal rate, regular rhythm and normal heart  sounds.   Pulmonary/Chest: Effort normal and breath sounds normal. No respiratory distress. She has no wheezes. She has no rales.  Abdominal: Soft. There is no tenderness.  Musculoskeletal: She exhibits no edema or tenderness.  Pain is reproduced with movement however not withpalpation. Patient shows me an area over her right lower back and spine wrapping to her right flank as to where she feels pain.  Neurological: She is alert.  Skin: Skin is warm and dry.  Psychiatric: She has a normal mood and affect.  Nursing note and vitals reviewed.    ED Treatments / Results  Labs (all labs ordered are listed, but only abnormal results are displayed) Labs Reviewed  BASIC METABOLIC  PANEL - Abnormal; Notable for the following:       Result Value   Glucose, Bld 108 (*)    GFR calc non Af Amer 60 (*)    All other components within normal limits  URINALYSIS, ROUTINE W REFLEX MICROSCOPIC - Abnormal; Notable for the following:    Color, Urine STRAW (*)    Specific Gravity, Urine 1.003 (*)    All other components within normal limits  CBC  I-STAT TROPONIN, ED   Radiology Dg Chest 2 View  Result Date: 09/22/2016 CLINICAL DATA:  Nausea and shortness of breath. EXAM: CHEST  2 VIEW COMPARISON:  05/20/2016 FINDINGS: Mild hyperexpansion. The lungs are clear without focal pneumonia, edema, pneumothorax or pleural effusion. The cardiopericardial silhouette is within normal limits for size. Nodular density/densities projecting over the lungs are compatible with pads for telemetry leads. The visualized bony structures of the thorax are intact. IMPRESSION: No active cardiopulmonary disease. Electronically Signed   By: Misty Stanley M.D.   On: 09/22/2016 12:14    Procedures Procedures (including critical care time)  Medications Ordered in ED Medications - No data to display   Initial Impression / Assessment and Plan / ED Course  I have reviewed the triage vital signs and the nursing notes.  Pertinent  labs & imaging results that were available during my care of the patient were reviewed by me and considered in my medical decision making (see chart for details).     Patient seen and examined. Reviewed workup with patient and husband Patrick Jupiter at bedside.  Vital signs reviewed and are as follows: BP (!) 137/53 (BP Location: Right Arm)   Pulse (!) 54   Temp 98.4 F (36.9 C) (Oral)   Resp 14   Ht 5\' 8"  (1.727 m)   Wt 79.4 kg (175 lb)   SpO2 97%   BMI 26.61 kg/m   Pending UA. Will check renal CT to rule out ureteral stone or other emergent etiology.  6:25 PM Patient discussed with and seen with Dr. Wilson Singer.   Feel safe for d/c to home.   The patient was urged to return to the Emergency Department immediately with worsening of current symptoms, worsening abdominal pain, persistent vomiting, blood noted in stools, fever, or any other concerns. The patient verbalized understanding.   Final Clinical Impressions(s) / ED Diagnoses   Final diagnoses:  Flank pain   Patient with c/o back/flank pain on the right side. Vague in nature. Nonreproducible to palpation but worse with positions. Patient evaluated with CT, labs, UA. These are reassuring but unrevealing.  Patient did have episode of shortness of breath and nausea upon arrival to the emergency department. This has not been ongoing and is not persistent. Troponin negative. EKG reviewed without signs of ischemia. Low concerngiven his history for PE or ACS.no objective signs of DVT on exam.  No dangerous or life-threatening conditions suspected or identified by history, physical exam, and by work-up. No indications for hospitalization identified.    New Prescriptions Current Discharge Medication List       Carlisle Cater, Hershal Coria 09/22/16 Greer Ee    Virgel Manifold, MD 10/08/16 1314

## 2016-09-22 NOTE — ED Triage Notes (Signed)
Pt states she feels nauseated, SOB and has right lower back pain. Denies CP. Pt speaking in complete sentences.

## 2016-09-22 NOTE — Discharge Instructions (Signed)
Please read and follow all provided instructions.  Your diagnoses today include:  1. Flank pain     Tests performed today include:  Blood counts and electrolytes  Blood tests to check liver and kidney function  Blood tests to check pancreas function  Urine test to look for infection - no infection seen  CT scan - no kidney stone or other serious cause of pain  Vital signs. See below for your results today.   Medications prescribed:   None  Take any prescribed medications only as directed.  Home care instructions:   Follow any educational materials contained in this packet.  Follow-up instructions: Please follow-up with your primary care provider in the next 2 days for further evaluation of your symptoms.    Return instructions:  SEEK IMMEDIATE MEDICAL ATTENTION IF:  The pain does not go away or becomes severe   A temperature above 101F develops   Repeated vomiting occurs (multiple episodes)   The pain becomes localized to portions of the abdomen. The right side could possibly be appendicitis. In an adult, the left lower portion of the abdomen could be colitis or diverticulitis.   Blood is being passed in stools or vomit (bright red or black tarry stools)   You develop chest pain, difficulty breathing, dizziness or fainting, or become confused, poorly responsive, or inconsolable (young children)  If you have any other emergent concerns regarding your health  Additional Information: Abdominal (belly) pain can be caused by many things. Your caregiver performed an examination and possibly ordered blood/urine tests and imaging (CT scan, x-rays, ultrasound). Many cases can be observed and treated at home after initial evaluation in the emergency department. Even though you are being discharged home, abdominal pain can be unpredictable. Therefore, you need a repeated exam if your pain does not resolve, returns, or worsens. Most patients with abdominal pain don't have to be  admitted to the hospital or have surgery, but serious problems like appendicitis and gallbladder attacks can start out as nonspecific pain. Many abdominal conditions cannot be diagnosed in one visit, so follow-up evaluations are very important.  Your vital signs today were: BP (!) 144/63    Pulse 87    Temp 98.4 F (36.9 C) (Oral)    Resp 16    Ht 5\' 8"  (1.727 m)    Wt 79.4 kg (175 lb)    SpO2 94%    BMI 26.61 kg/m  If your blood pressure (bp) was elevated above 135/85 this visit, please have this repeated by your doctor within one month. --------------

## 2016-10-15 DIAGNOSIS — H401131 Primary open-angle glaucoma, bilateral, mild stage: Secondary | ICD-10-CM | POA: Diagnosis not present

## 2017-01-19 DIAGNOSIS — G4700 Insomnia, unspecified: Secondary | ICD-10-CM | POA: Diagnosis not present

## 2017-01-19 DIAGNOSIS — J029 Acute pharyngitis, unspecified: Secondary | ICD-10-CM | POA: Diagnosis not present

## 2017-01-19 DIAGNOSIS — I10 Essential (primary) hypertension: Secondary | ICD-10-CM | POA: Diagnosis not present

## 2017-01-19 DIAGNOSIS — R69 Illness, unspecified: Secondary | ICD-10-CM | POA: Diagnosis not present

## 2017-02-14 ENCOUNTER — Other Ambulatory Visit: Payer: Self-pay | Admitting: Internal Medicine

## 2017-02-14 DIAGNOSIS — Z1231 Encounter for screening mammogram for malignant neoplasm of breast: Secondary | ICD-10-CM

## 2017-03-10 DIAGNOSIS — Z853 Personal history of malignant neoplasm of breast: Secondary | ICD-10-CM | POA: Diagnosis not present

## 2017-03-10 DIAGNOSIS — Z823 Family history of stroke: Secondary | ICD-10-CM | POA: Diagnosis not present

## 2017-03-10 DIAGNOSIS — Z6831 Body mass index (BMI) 31.0-31.9, adult: Secondary | ICD-10-CM | POA: Diagnosis not present

## 2017-03-10 DIAGNOSIS — G4700 Insomnia, unspecified: Secondary | ICD-10-CM | POA: Diagnosis not present

## 2017-03-10 DIAGNOSIS — I10 Essential (primary) hypertension: Secondary | ICD-10-CM | POA: Diagnosis not present

## 2017-03-10 DIAGNOSIS — Z880 Allergy status to penicillin: Secondary | ICD-10-CM | POA: Diagnosis not present

## 2017-03-10 DIAGNOSIS — Z8249 Family history of ischemic heart disease and other diseases of the circulatory system: Secondary | ICD-10-CM | POA: Diagnosis not present

## 2017-03-10 DIAGNOSIS — E669 Obesity, unspecified: Secondary | ICD-10-CM | POA: Diagnosis not present

## 2017-03-10 DIAGNOSIS — Z7982 Long term (current) use of aspirin: Secondary | ICD-10-CM | POA: Diagnosis not present

## 2017-03-10 DIAGNOSIS — H409 Unspecified glaucoma: Secondary | ICD-10-CM | POA: Diagnosis not present

## 2017-03-25 DIAGNOSIS — B029 Zoster without complications: Secondary | ICD-10-CM | POA: Diagnosis not present

## 2017-03-25 DIAGNOSIS — H401131 Primary open-angle glaucoma, bilateral, mild stage: Secondary | ICD-10-CM | POA: Diagnosis not present

## 2017-03-25 DIAGNOSIS — H35363 Drusen (degenerative) of macula, bilateral: Secondary | ICD-10-CM | POA: Diagnosis not present

## 2017-03-25 DIAGNOSIS — H25012 Cortical age-related cataract, left eye: Secondary | ICD-10-CM | POA: Diagnosis not present

## 2017-03-25 DIAGNOSIS — H2512 Age-related nuclear cataract, left eye: Secondary | ICD-10-CM | POA: Diagnosis not present

## 2017-03-28 ENCOUNTER — Ambulatory Visit: Payer: PPO

## 2017-04-01 DIAGNOSIS — B029 Zoster without complications: Secondary | ICD-10-CM | POA: Diagnosis not present

## 2017-04-01 DIAGNOSIS — L304 Erythema intertrigo: Secondary | ICD-10-CM | POA: Diagnosis not present

## 2017-05-04 DIAGNOSIS — R69 Illness, unspecified: Secondary | ICD-10-CM | POA: Diagnosis not present

## 2017-05-12 ENCOUNTER — Ambulatory Visit
Admission: RE | Admit: 2017-05-12 | Discharge: 2017-05-12 | Disposition: A | Discharge status: Home / Self Care | Payer: Medicare HMO | Source: Ambulatory Visit | Attending: Internal Medicine | Admitting: Internal Medicine

## 2017-05-12 DIAGNOSIS — I10 Essential (primary) hypertension: Secondary | ICD-10-CM | POA: Diagnosis not present

## 2017-05-12 DIAGNOSIS — E789 Disorder of lipoprotein metabolism, unspecified: Secondary | ICD-10-CM | POA: Diagnosis not present

## 2017-05-12 DIAGNOSIS — Z1231 Encounter for screening mammogram for malignant neoplasm of breast: Secondary | ICD-10-CM

## 2017-05-19 DIAGNOSIS — Z853 Personal history of malignant neoplasm of breast: Secondary | ICD-10-CM | POA: Diagnosis not present

## 2017-05-19 DIAGNOSIS — Z0000 Encounter for general adult medical examination without abnormal findings: Secondary | ICD-10-CM | POA: Diagnosis not present

## 2017-05-19 DIAGNOSIS — Z23 Encounter for immunization: Secondary | ICD-10-CM | POA: Diagnosis not present

## 2017-05-19 DIAGNOSIS — I10 Essential (primary) hypertension: Secondary | ICD-10-CM | POA: Diagnosis not present

## 2017-05-19 DIAGNOSIS — K635 Polyp of colon: Secondary | ICD-10-CM | POA: Diagnosis not present

## 2017-05-19 DIAGNOSIS — G4700 Insomnia, unspecified: Secondary | ICD-10-CM | POA: Diagnosis not present

## 2017-06-21 DIAGNOSIS — H401131 Primary open-angle glaucoma, bilateral, mild stage: Secondary | ICD-10-CM | POA: Diagnosis not present

## 2017-06-21 DIAGNOSIS — H25012 Cortical age-related cataract, left eye: Secondary | ICD-10-CM | POA: Diagnosis not present

## 2017-06-21 DIAGNOSIS — H2512 Age-related nuclear cataract, left eye: Secondary | ICD-10-CM | POA: Diagnosis not present

## 2017-06-21 DIAGNOSIS — H35363 Drusen (degenerative) of macula, bilateral: Secondary | ICD-10-CM | POA: Diagnosis not present

## 2017-07-20 DIAGNOSIS — H25812 Combined forms of age-related cataract, left eye: Secondary | ICD-10-CM | POA: Diagnosis not present

## 2017-07-20 DIAGNOSIS — H2512 Age-related nuclear cataract, left eye: Secondary | ICD-10-CM | POA: Diagnosis not present

## 2017-07-20 DIAGNOSIS — H52222 Regular astigmatism, left eye: Secondary | ICD-10-CM | POA: Diagnosis not present

## 2017-07-20 DIAGNOSIS — H401121 Primary open-angle glaucoma, left eye, mild stage: Secondary | ICD-10-CM | POA: Diagnosis not present

## 2017-08-08 DIAGNOSIS — C50911 Malignant neoplasm of unspecified site of right female breast: Secondary | ICD-10-CM | POA: Diagnosis not present

## 2017-09-20 DIAGNOSIS — Z0100 Encounter for examination of eyes and vision without abnormal findings: Secondary | ICD-10-CM | POA: Diagnosis not present

## 2017-11-24 DIAGNOSIS — Z23 Encounter for immunization: Secondary | ICD-10-CM | POA: Diagnosis not present

## 2017-11-24 DIAGNOSIS — H04123 Dry eye syndrome of bilateral lacrimal glands: Secondary | ICD-10-CM | POA: Diagnosis not present

## 2017-11-24 DIAGNOSIS — I10 Essential (primary) hypertension: Secondary | ICD-10-CM | POA: Diagnosis not present

## 2017-11-24 DIAGNOSIS — H401131 Primary open-angle glaucoma, bilateral, mild stage: Secondary | ICD-10-CM | POA: Diagnosis not present

## 2017-11-24 DIAGNOSIS — G4700 Insomnia, unspecified: Secondary | ICD-10-CM | POA: Diagnosis not present

## 2018-03-10 IMAGING — DX DG CHEST 2V
2 series · 2 of 2 positions shown · non-contrast
Comparison: 05/20/2016

CLINICAL DATA: Nausea and shortness of breath.

EXAM:
CHEST  2 VIEW

[chest pa]
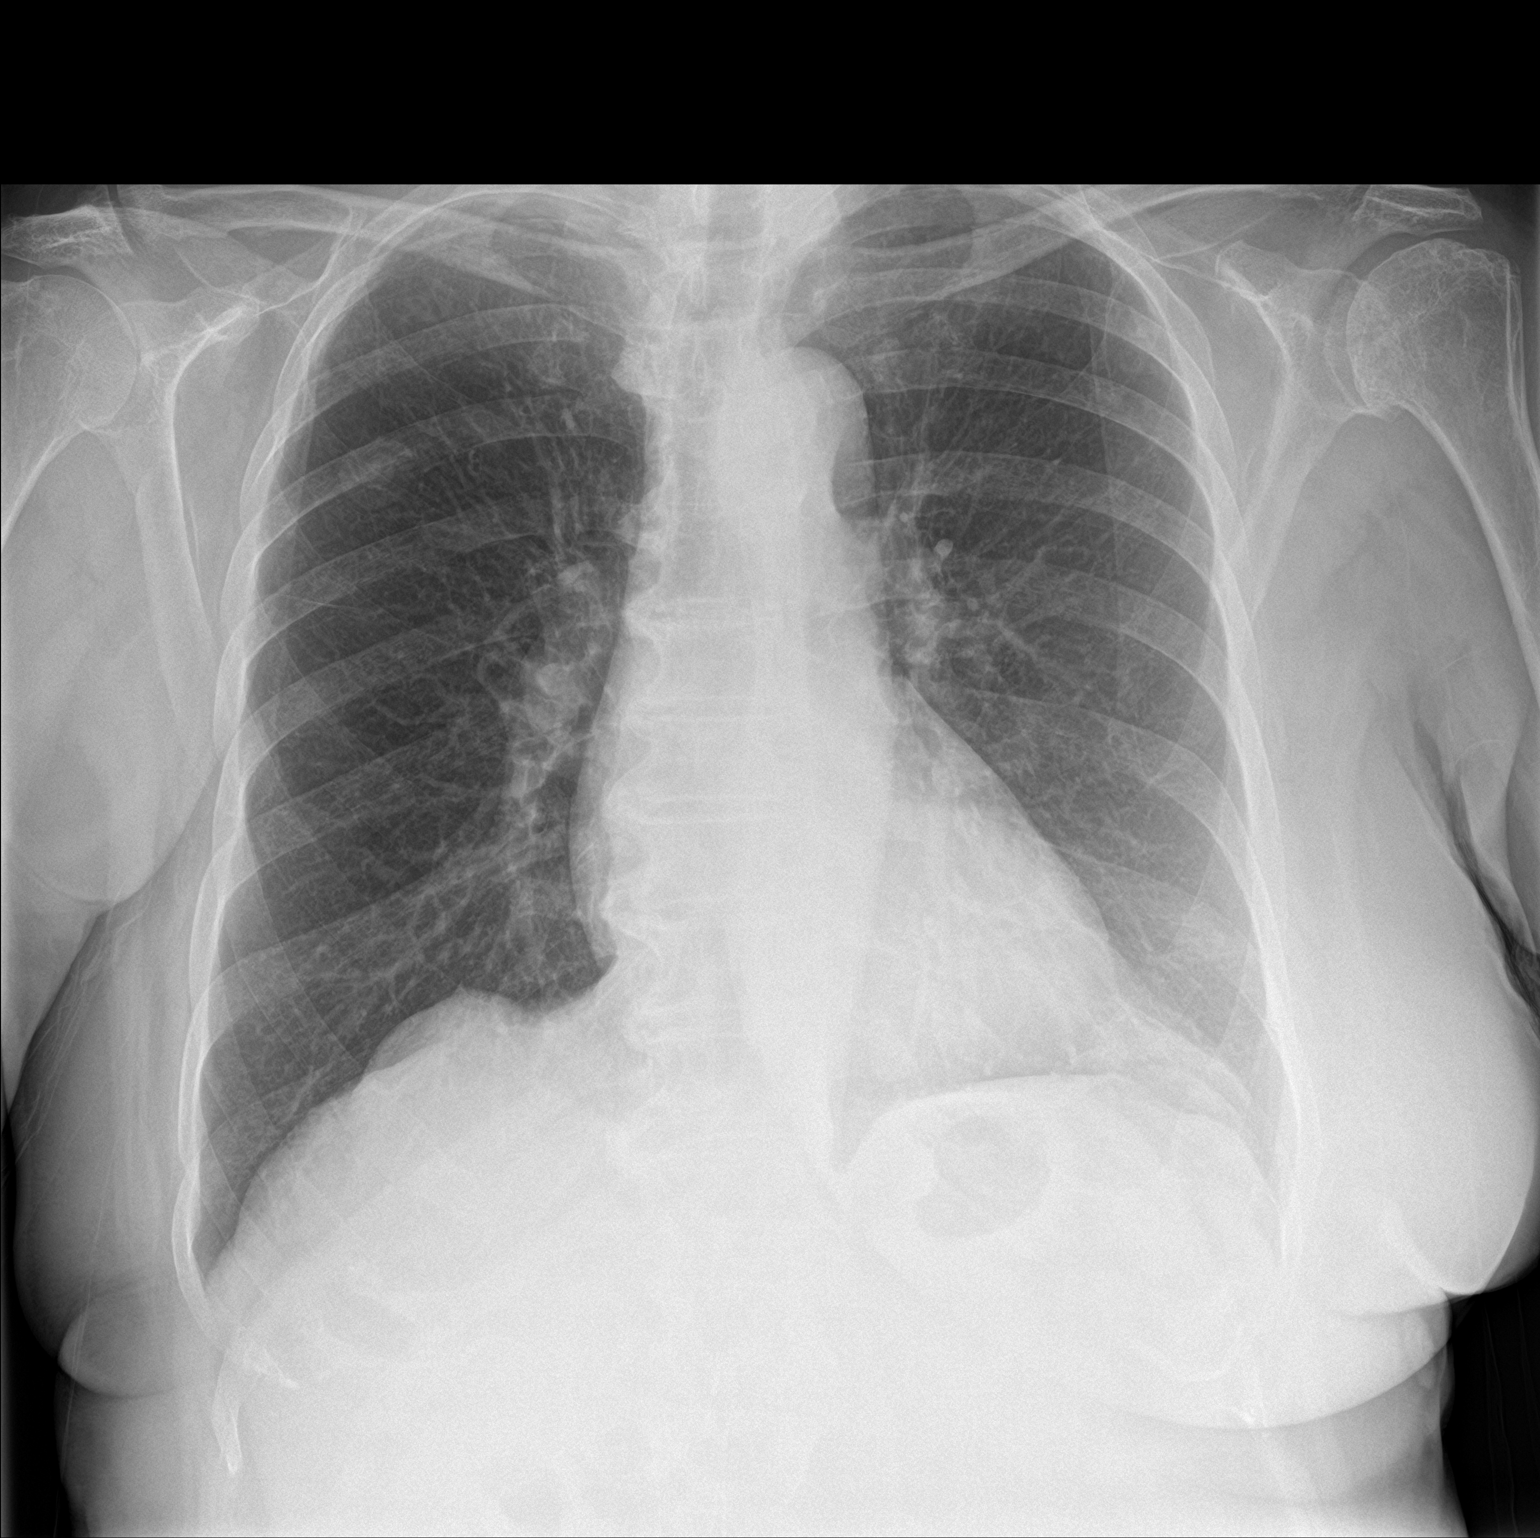

[chest lat]
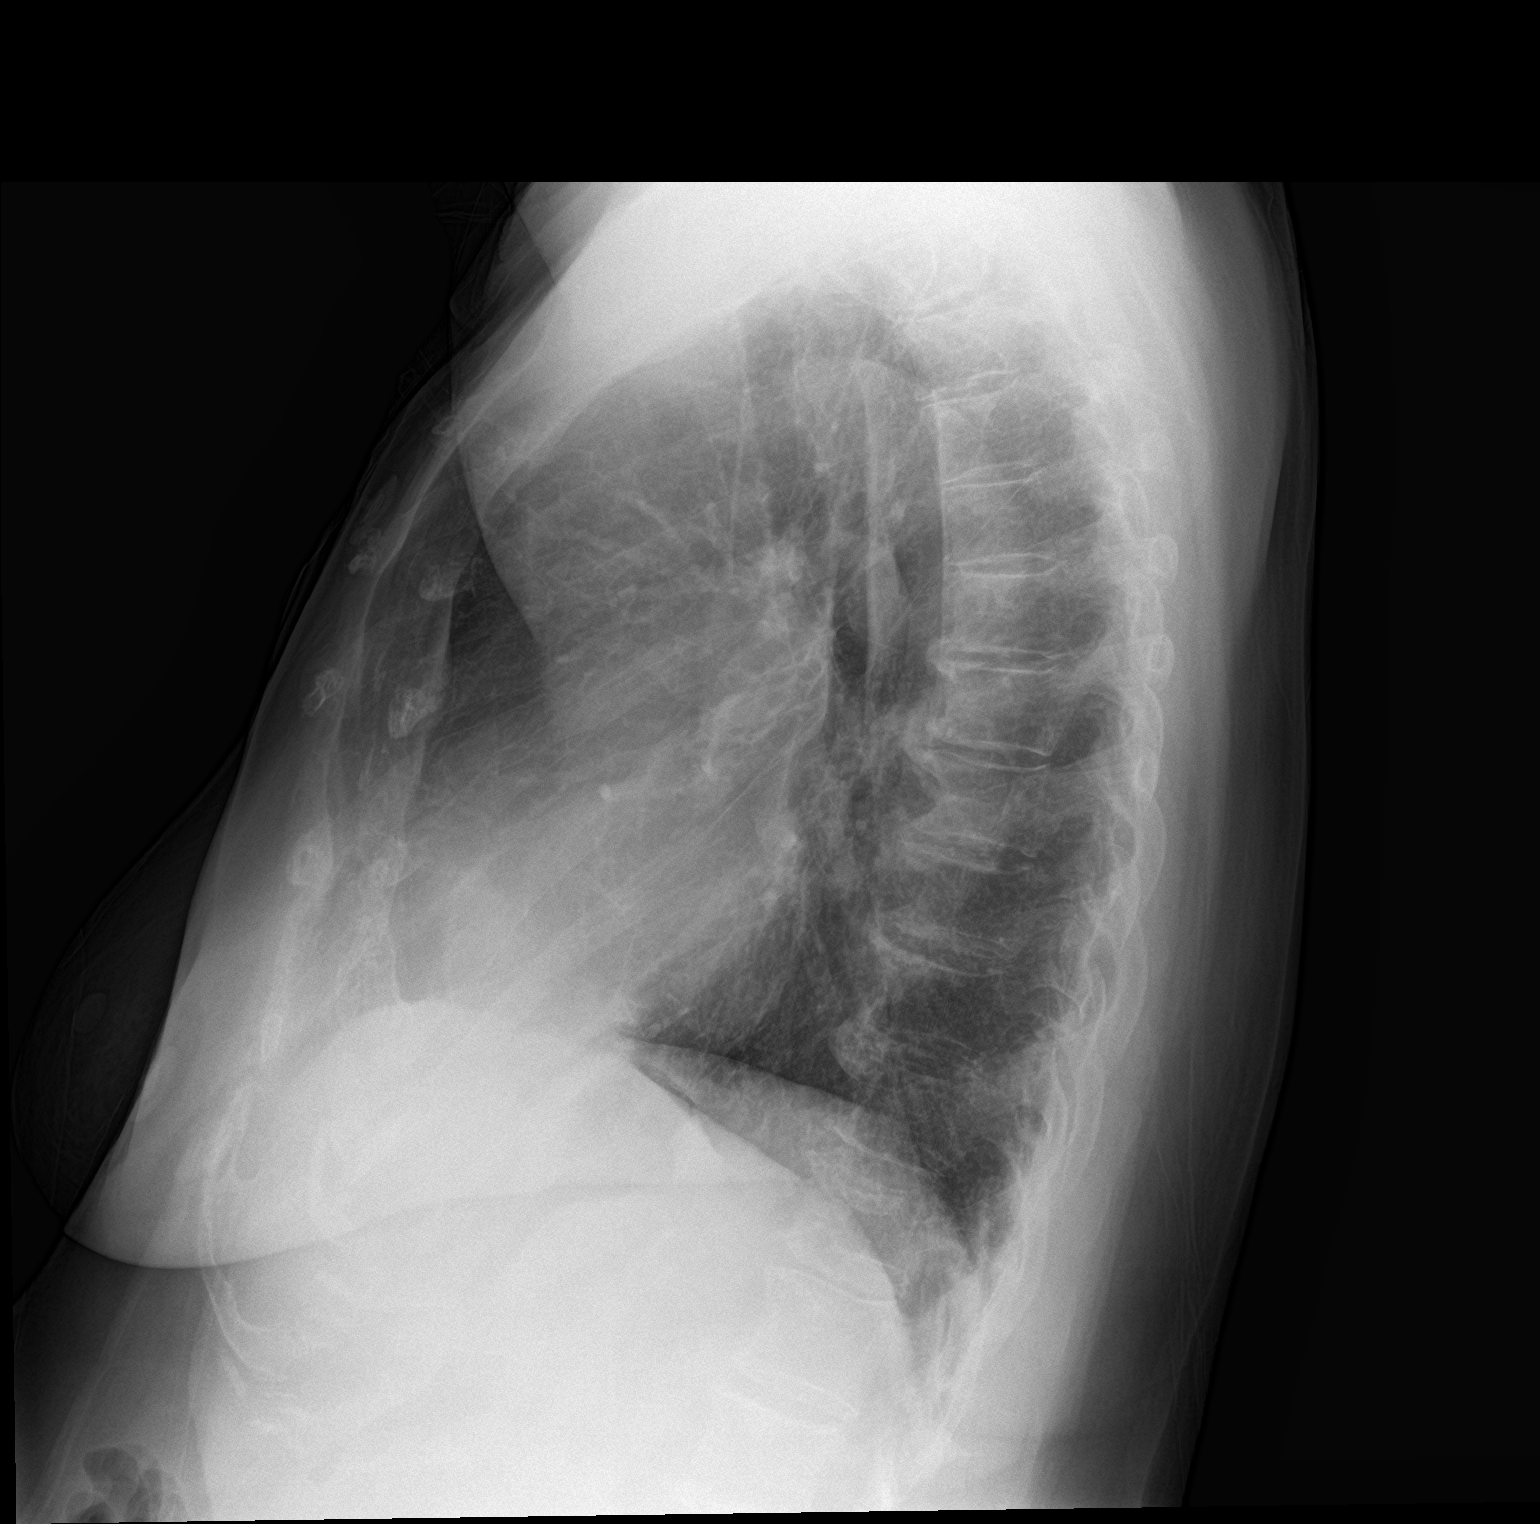

[2 of 2 positions shown; findings below may reference images not displayed]

FINDINGS: Mild hyperexpansion. The lungs are clear without focal pneumonia,
edema, pneumothorax or pleural effusion. The cardiopericardial
silhouette is within normal limits for size. Nodular
density/densities projecting over the lungs are compatible with pads
for telemetry leads. The visualized bony structures of the thorax
are intact.
IMPRESSION: No active cardiopulmonary disease.

## 2018-04-06 ENCOUNTER — Other Ambulatory Visit: Payer: Self-pay | Admitting: Registered Nurse

## 2018-04-06 DIAGNOSIS — Z1231 Encounter for screening mammogram for malignant neoplasm of breast: Secondary | ICD-10-CM

## 2018-06-06 ENCOUNTER — Ambulatory Visit: Payer: Medicare HMO

## 2018-06-06 DIAGNOSIS — I10 Essential (primary) hypertension: Secondary | ICD-10-CM | POA: Diagnosis not present

## 2018-06-06 DIAGNOSIS — Z0000 Encounter for general adult medical examination without abnormal findings: Secondary | ICD-10-CM | POA: Diagnosis not present

## 2018-06-12 DIAGNOSIS — Z0000 Encounter for general adult medical examination without abnormal findings: Secondary | ICD-10-CM | POA: Diagnosis not present

## 2018-06-12 DIAGNOSIS — I10 Essential (primary) hypertension: Secondary | ICD-10-CM | POA: Diagnosis not present

## 2018-06-17 ENCOUNTER — Other Ambulatory Visit: Payer: Self-pay

## 2018-06-17 ENCOUNTER — Ambulatory Visit
Admission: RE | Admit: 2018-06-17 | Discharge: 2018-06-17 | Disposition: A | Discharge status: Home / Self Care | Payer: Medicare HMO | Source: Ambulatory Visit | Attending: Registered Nurse | Admitting: Registered Nurse

## 2018-06-17 DIAGNOSIS — Z1231 Encounter for screening mammogram for malignant neoplasm of breast: Secondary | ICD-10-CM

## 2018-06-23 DIAGNOSIS — W57XXXA Bitten or stung by nonvenomous insect and other nonvenomous arthropods, initial encounter: Secondary | ICD-10-CM | POA: Diagnosis not present

## 2018-06-23 DIAGNOSIS — S1096XA Insect bite of unspecified part of neck, initial encounter: Secondary | ICD-10-CM | POA: Diagnosis not present

## 2018-09-12 DIAGNOSIS — R69 Illness, unspecified: Secondary | ICD-10-CM | POA: Diagnosis not present

## 2018-10-30 ENCOUNTER — Encounter (HOSPITAL_COMMUNITY): Payer: Self-pay | Admitting: *Deleted

## 2018-10-30 ENCOUNTER — Emergency Department (HOSPITAL_COMMUNITY)
Admission: EM | Admit: 2018-10-30 | Discharge: 2018-10-31 | Disposition: A | Discharge status: Home / Self Care | Payer: Medicare HMO | Attending: Emergency Medicine | Admitting: Emergency Medicine

## 2018-10-30 DIAGNOSIS — Z7982 Long term (current) use of aspirin: Secondary | ICD-10-CM | POA: Diagnosis not present

## 2018-10-30 DIAGNOSIS — R42 Dizziness and giddiness: Secondary | ICD-10-CM | POA: Diagnosis not present

## 2018-10-30 DIAGNOSIS — Z79899 Other long term (current) drug therapy: Secondary | ICD-10-CM | POA: Diagnosis not present

## 2018-10-30 DIAGNOSIS — R519 Headache, unspecified: Secondary | ICD-10-CM | POA: Diagnosis not present

## 2018-10-30 DIAGNOSIS — Z853 Personal history of malignant neoplasm of breast: Secondary | ICD-10-CM | POA: Diagnosis not present

## 2018-10-30 DIAGNOSIS — I10 Essential (primary) hypertension: Secondary | ICD-10-CM

## 2018-10-30 DIAGNOSIS — R202 Paresthesia of skin: Secondary | ICD-10-CM | POA: Diagnosis not present

## 2018-10-30 DIAGNOSIS — R197 Diarrhea, unspecified: Secondary | ICD-10-CM | POA: Diagnosis not present

## 2018-10-30 DIAGNOSIS — R0902 Hypoxemia: Secondary | ICD-10-CM | POA: Diagnosis not present

## 2018-10-30 DIAGNOSIS — R001 Bradycardia, unspecified: Secondary | ICD-10-CM | POA: Diagnosis not present

## 2018-10-30 MED ORDER — SODIUM CHLORIDE 0.9% FLUSH: 3.0000 mL | Freq: Once | INTRAVENOUS | Status: DC

## 2018-10-30 NOTE — ED Triage Notes (Signed)
Pt was doing the dishes tonight and felt dizzy with tingling in legs. Pt took her bp at home, reading 181/80. Pt took 1.5 pills of Atenolol (usual dose is one pill at night). Pt c/o tightness in frontal head. Denies dizziness at present. Initial Bp with EMS was 200/90; last reading was 150/74 20g IV in [facility]

## 2018-10-30 NOTE — ED Notes (Signed)
Pt took 324mg  ASA at 2000

## 2018-10-31 ENCOUNTER — Emergency Department (HOSPITAL_COMMUNITY): Payer: Medicare HMO

## 2018-10-31 DIAGNOSIS — I10 Essential (primary) hypertension: Secondary | ICD-10-CM | POA: Diagnosis not present

## 2018-10-31 DIAGNOSIS — R519 Headache, unspecified: Secondary | ICD-10-CM | POA: Diagnosis not present

## 2018-10-31 LAB — URINALYSIS, ROUTINE W REFLEX MICROSCOPIC
Bilirubin Urine: NEGATIVE
Glucose, UA: NEGATIVE mg/dL
Hgb urine dipstick: NEGATIVE
Ketones, ur: NEGATIVE mg/dL
Leukocytes,Ua: NEGATIVE
Nitrite: NEGATIVE
Protein, ur: NEGATIVE mg/dL
Specific Gravity, Urine: 1.017 (ref 1.005–1.030)
pH: 5 (ref 5.0–8.0)

## 2018-10-31 LAB — BASIC METABOLIC PANEL
Anion gap: 12 (ref 5–15)
BUN: 25 mg/dL — ABNORMAL HIGH (ref 8–23)
CO2: 19 mmol/L — ABNORMAL LOW (ref 22–32)
Calcium: 9 mg/dL (ref 8.9–10.3)
Chloride: 104 mmol/L (ref 98–111)
Creatinine, Ser: 0.87 mg/dL (ref 0.44–1.00)
GFR calc Af Amer: >60 mL/min (ref >60)
GFR calc non Af Amer: >60 mL/min (ref >60)
Glucose, Bld: 141 mg/dL — ABNORMAL HIGH (ref 70–99)
Potassium: 4.1 mmol/L (ref 3.5–5.1)
Sodium: 135 mmol/L (ref 135–145)

## 2018-10-31 LAB — CBC
HCT: 40.5 % (ref 36.0–46.0)
Hemoglobin: 13.8 g/dL (ref 12.0–15.0)
MCH: 31.7 pg (ref 26.0–34.0)
MCHC: 34.1 g/dL (ref 30.0–36.0)
MCV: 93.1 fL (ref 80.0–100.0)
Platelets: 262 10*3/uL (ref 150–400)
RBC: 4.35 MIL/uL (ref 3.87–5.11)
RDW: 13.2 % (ref 11.5–15.5)
WBC: 8.1 10*3/uL (ref 4.0–10.5)
nRBC: 0 % (ref 0.0–0.2)

## 2018-10-31 NOTE — ED Notes (Signed)
Pt ambulated to RR with very little assistance and no complaints

## 2018-10-31 NOTE — ED Notes (Signed)
Pt transported to CT ?

## 2018-10-31 NOTE — ED Provider Notes (Signed)
TIME SEEN: 3:51 AM  CHIEF COMPLAINT: Lightheadedness, tingling and weakness in bilateral lower extremities, hypertension  HPI: Patient is an 85 year old female with history of hypertension, hyperlipidemia who presents to the emergency department with an episode of feeling lightheaded today.  States she was feeling poorly all day today but cannot describe it any further.  She states that she was sitting down eating dinner and got up from her chair to go to the kitchen and wash dishes.  She states she felt lightheaded.  She went to go lie down and states that she felt from her knees up to her hips on both sides that there was "worms crawling" on her legs and felt both of her legs were weak.  This lasted for several minutes and then resolved.  No numbness or focal weakness.  No chest pain or chest discomfort, shortness of breath, cough, vomiting or diarrhea.  States she has had some loose stool today.  States that her blood pressure was [id number] to [id number] systolic tonight which is abnormal for her.  She took 1-1/2 tablets of her blood pressure medication prior to EMS arrival.  The systolic was in the 123456 with EMS.  ROS: See HPI Constitutional: no fever  Eyes: no drainage  ENT: no runny nose   Cardiovascular:  no chest pain  Resp: no SOB  GI: no vomiting GU: no dysuria Integumentary: no rash  Allergy: no hives  Musculoskeletal: no leg swelling  Neurological: no slurred speech ROS otherwise negative  PAST MEDICAL HISTORY/PAST SURGICAL HISTORY:  Past Medical History:  Diagnosis Date  . Arthritis    "maybe a little bit in my knees" (05/02/2014)  . Breast cancer, right breast ([place])   . Glaucoma of both eyes   . HLD (hyperlipidemia)   . Hypertension     MEDICATIONS:  Prior to Admission medications   Medication Sig Start Date End Date Taking? Authorizing Provider  Ascorbic Acid (VITAMIN C) 100 MG tablet Take 100 mg by mouth daily.    [provider]  aspirin 81 MG tablet Take 81 mg by  mouth daily.    [provider]  atenolol (TENORMIN) 25 MG tablet Take 25 mg by mouth 2 (two) times daily.     [provider]  bimatoprost (LUMIGAN) 0.01 % SOLN Place 1 drop into both eyes at bedtime.    [provider]  cholecalciferol (VITAMIN D) 1000 UNITS tablet Take 1,000 Units by mouth daily.    [provider]  Fish Oil-Cholecalciferol (FISH OIL + D3 PO) Take 1,000 mg by mouth daily.    [provider]  loratadine (CLARITIN) 10 MG tablet Take 1 tablet (10 mg total) by mouth daily. Patient taking differently: Take 10 mg by mouth daily as needed for allergies.  05/03/14   Rai, Vernelle Emerald, MD  Misc Natural Products ([employer] GLUCOS-CHONDROIT-MSM DS) TABS Take 1 tablet by mouth daily.    [provider]  vitamin E 100 UNIT capsule Take 100 Units by mouth daily.    [provider]  zolpidem (AMBIEN) 10 MG tablet Take 5 mg by mouth at bedtime as needed for sleep.    [provider]    ALLERGIES:  Allergies  Allergen Reactions  . Penicillins Shortness Of Breath and Swelling    Has patient had a PCN reaction causing immediate rash, facial/tongue/throat swelling, SOB or lightheadedness with hypotension: Yes Has patient had a PCN reaction causing severe rash involving mucus membranes or skin necrosis: Yes Has patient had a  PCN reaction that required hospitalization: No Has patient had a PCN reaction occurring within the last 10 years: No If all of the above answers are "NO", then may proceed with Cephalosporin use.     SOCIAL HISTORY:  Social History   Tobacco Use  . Smoking status: Never Smoker  . Smokeless tobacco: Never Used  Substance Use Topics  . Alcohol use: No    FAMILY HISTORY: Family History  Problem Relation Age of Onset  . Glaucoma Mother   . Hyperthyroidism Mother   . Heart disease Brother   . Rheumatic fever Sister   . Arthritis Sister   . Breast cancer Maternal Aunt        in 90's    EXAM: BP  (!) 139/51 (BP Location: Left Arm)   Pulse (!) 53   Temp (!) 97.4 F (36.3 C) (Oral)   Resp 13   SpO2 94%  CONSTITUTIONAL: Alert and oriented and responds appropriately to questions. Well-appearing; well-nourished, elderly, no distress, extremely pleasant HEAD: Normocephalic EYES: Conjunctivae clear, pupils appear equal, EOMI ENT: normal nose; moist mucous membranes NECK: Supple, no meningismus, no nuchal rigidity, no LAD  CARD: RRR; S1 and S2 appreciated; no murmurs, no clicks, no rubs, no gallops RESP: Normal chest excursion without splinting or tachypnea; breath sounds clear and equal bilaterally; no wheezes, no rhonchi, no rales, no hypoxia or respiratory distress, speaking full sentences ABD/GI: Normal bowel sounds; non-distended; soft, non-tender, no rebound, no guarding, no peritoneal signs, no hepatosplenomegaly BACK:  The back appears normal and is non-tender to palpation, there is no CVA tenderness EXT: Normal ROM in all joints; non-tender to palpation; no edema; normal capillary refill; no cyanosis, no calf tenderness or swelling    SKIN: Normal color for age and race; warm; no rash NEURO: Moves all extremities equally, sensation to light touch intact diffusely, cranial nerves II through XII intact, normal speech, strength 5/5 in all 4 extremities PSYCH: The patient's mood and manner are appropriate. Grooming and personal hygiene are appropriate.  MEDICAL DECISION MAKING: Patient here with nonspecific symptoms of lightheadedness, tingling in her proximal upper extremities bilaterally with global weakness.  Symptoms have resolved.  May have been related to her hypertension.  She does state that she woke up this morning not feeling well and her blood pressure was in the 120s/70s.  She has no infectious symptoms.  No complaints of chest pain or shortness of breath.  EKG shows no arrhythmia or ischemic abnormality.  Labs here are unremarkable without anemia, electrolyte derangement.  No  UTI or signs of dehydration.  She does complain of some head discomfort earlier.  Will obtain head CT although suspicion for intracranial hemorrhage, stroke is low.  Will check orthostatic vital signs, ambulate patient in the ED.  Patient continues to feel well with normal vital signs and unremarkable work-up, anticipate discharge home with PCP follow-up.  She is also comfortable with this plan.  Doubt ACS, PE, dissection, infection, dehydration today.  ED PROGRESS: Patient's head CT shows no acute abnormality.  Able to ambulate without difficulty.  Negative orthostatic vital signs.  Have advised her to follow-up with her PCP Dr. Maudie Mercury for further management of her blood pressure.  She will continue her medications as prescribed.  I do not feel they need to be adjusted from the emergency department and she is comfortable with this plan.  Discussed return precautions.   At this time, I do not feel there is any life-threatening condition present. I have reviewed and discussed  all results (EKG, imaging, lab, urine as appropriate) and exam findings with patient/family. I have reviewed nursing notes and appropriate previous records.  I feel the patient is safe to be discharged home without further emergent workup and can continue workup as an outpatient as needed. Discussed usual and customary return precautions. Patient/family verbalize understanding and are comfortable with this plan.  Outpatient follow-up has been provided as needed. All questions have been answered.     EKG Interpretation  Date/Time:  Monday October 30 2018 22:29:31 EDT Ventricular Rate:  55 PR Interval:  252 QRS Duration: 92 QT Interval:  428 QTC Calculation: 409 R Axis:   -7 Text Interpretation:  Sinus bradycardia with 1st degree A-V block Minimal voltage criteria for LVH, may be normal variant ( R in aVL ) Cannot rule out Anterior infarct , age undetermined Abnormal ECG No significant change since last tracing since 2018 Confirmed  by Pryor Curia [phone]) on 10/31/2018 3:52:09 AM       Amy Tapia was evaluated in Emergency Department on 10/31/2018 for the symptoms described in the history of present illness. She was evaluated in the context of the global COVID-19 pandemic, which necessitated consideration that the patient might be at risk for infection with the SARS-CoV-2 virus that causes COVID-19. Institutional protocols and algorithms that pertain to the evaluation of patients at risk for COVID-19 are in a state of rapid change based on information released by regulatory bodies including the CDC and federal and state organizations. These policies and algorithms were followed during the patient's care in the ED.    Jalil Lorusso, Delice Bison, DO 10/31/18 Cindie Laroche

## 2018-10-31 NOTE — Discharge Instructions (Signed)
Please continue your medications as prescribed.  I recommend close follow-up with your primary care physician.  I recommend that you keep a log of your blood pressure and check it twice a day.

## 2018-11-03 DIAGNOSIS — Z23 Encounter for immunization: Secondary | ICD-10-CM | POA: Diagnosis not present

## 2018-11-03 DIAGNOSIS — I10 Essential (primary) hypertension: Secondary | ICD-10-CM | POA: Diagnosis not present

## 2018-11-03 DIAGNOSIS — S39012A Strain of muscle, fascia and tendon of lower back, initial encounter: Secondary | ICD-10-CM | POA: Diagnosis not present

## 2018-12-06 DIAGNOSIS — K5792 Diverticulitis of intestine, part unspecified, without perforation or abscess without bleeding: Secondary | ICD-10-CM | POA: Diagnosis not present

## 2018-12-06 DIAGNOSIS — I10 Essential (primary) hypertension: Secondary | ICD-10-CM | POA: Diagnosis not present

## 2018-12-06 DIAGNOSIS — R109 Unspecified abdominal pain: Secondary | ICD-10-CM | POA: Diagnosis not present

## 2018-12-06 DIAGNOSIS — G4700 Insomnia, unspecified: Secondary | ICD-10-CM | POA: Diagnosis not present

## 2018-12-06 DIAGNOSIS — Z0000 Encounter for general adult medical examination without abnormal findings: Secondary | ICD-10-CM | POA: Diagnosis not present

## 2018-12-06 DIAGNOSIS — E039 Hypothyroidism, unspecified: Secondary | ICD-10-CM | POA: Diagnosis not present

## 2018-12-06 DIAGNOSIS — E785 Hyperlipidemia, unspecified: Secondary | ICD-10-CM | POA: Diagnosis not present

## 2018-12-11 DIAGNOSIS — R110 Nausea: Secondary | ICD-10-CM | POA: Diagnosis not present

## 2018-12-11 DIAGNOSIS — K5792 Diverticulitis of intestine, part unspecified, without perforation or abscess without bleeding: Secondary | ICD-10-CM | POA: Diagnosis not present

## 2018-12-11 DIAGNOSIS — R109 Unspecified abdominal pain: Secondary | ICD-10-CM | POA: Diagnosis not present

## 2018-12-13 DIAGNOSIS — K5792 Diverticulitis of intestine, part unspecified, without perforation or abscess without bleeding: Secondary | ICD-10-CM | POA: Diagnosis not present

## 2018-12-13 DIAGNOSIS — E039 Hypothyroidism, unspecified: Secondary | ICD-10-CM | POA: Diagnosis not present

## 2018-12-13 DIAGNOSIS — I10 Essential (primary) hypertension: Secondary | ICD-10-CM | POA: Diagnosis not present

## 2018-12-22 DIAGNOSIS — I499 Cardiac arrhythmia, unspecified: Secondary | ICD-10-CM | POA: Diagnosis not present

## 2018-12-22 DIAGNOSIS — I10 Essential (primary) hypertension: Secondary | ICD-10-CM | POA: Diagnosis not present

## 2018-12-22 DIAGNOSIS — Z853 Personal history of malignant neoplasm of breast: Secondary | ICD-10-CM | POA: Diagnosis not present

## 2018-12-22 DIAGNOSIS — G4700 Insomnia, unspecified: Secondary | ICD-10-CM | POA: Diagnosis not present

## 2018-12-22 DIAGNOSIS — H04129 Dry eye syndrome of unspecified lacrimal gland: Secondary | ICD-10-CM | POA: Diagnosis not present

## 2018-12-22 DIAGNOSIS — Z880 Allergy status to penicillin: Secondary | ICD-10-CM | POA: Diagnosis not present

## 2018-12-26 DIAGNOSIS — I10 Essential (primary) hypertension: Secondary | ICD-10-CM | POA: Diagnosis not present

## 2018-12-26 DIAGNOSIS — R109 Unspecified abdominal pain: Secondary | ICD-10-CM | POA: Diagnosis not present

## 2019-01-11 DIAGNOSIS — K5790 Diverticulosis of intestine, part unspecified, without perforation or abscess without bleeding: Secondary | ICD-10-CM | POA: Diagnosis not present

## 2019-02-22 DIAGNOSIS — Z853 Personal history of malignant neoplasm of breast: Secondary | ICD-10-CM | POA: Diagnosis not present

## 2019-02-22 DIAGNOSIS — I10 Essential (primary) hypertension: Secondary | ICD-10-CM | POA: Diagnosis not present

## 2019-02-22 DIAGNOSIS — H409 Unspecified glaucoma: Secondary | ICD-10-CM | POA: Diagnosis not present

## 2019-02-22 DIAGNOSIS — Z7982 Long term (current) use of aspirin: Secondary | ICD-10-CM | POA: Diagnosis not present

## 2019-02-22 DIAGNOSIS — R32 Unspecified urinary incontinence: Secondary | ICD-10-CM | POA: Diagnosis not present

## 2019-02-22 DIAGNOSIS — Z008 Encounter for other general examination: Secondary | ICD-10-CM | POA: Diagnosis not present

## 2019-02-22 DIAGNOSIS — Z880 Allergy status to penicillin: Secondary | ICD-10-CM | POA: Diagnosis not present

## 2019-02-28 DIAGNOSIS — R69 Illness, unspecified: Secondary | ICD-10-CM | POA: Diagnosis not present

## 2019-03-11 ENCOUNTER — Other Ambulatory Visit: Payer: Self-pay

## 2019-03-11 ENCOUNTER — Ambulatory Visit: Payer: Medicare HMO | Attending: Internal Medicine

## 2019-03-11 DIAGNOSIS — Z23 Encounter for immunization: Secondary | ICD-10-CM | POA: Insufficient documentation

## 2019-03-11 NOTE — Progress Notes (Signed)
   Covid-19 Vaccination Clinic  Name:  Amy Tapia    MRN: [id number] DOB: 08/17/1933  03/11/2019  [name] was observed post Covid-19 immunization for 15 minutes without incidence. She was provided with Vaccine Information Sheet and instruction to access the V-Safe system.   [name] was instructed to call 911 with any severe reactions post vaccine: Marland Kitchen Difficulty breathing  . Swelling of your face and throat  . A fast heartbeat  . A bad rash all over your body  . Dizziness and weakness    Immunizations Administered    Name Date Dose VIS Date Route   Pfizer COVID-19 Vaccine 03/11/2019  8:49 AM 0.3 mL 12/22/2018 Intramuscular   Manufacturer: [place]   Lot: [id number]   [place]: [id number]

## 2019-03-12 DIAGNOSIS — E039 Hypothyroidism, unspecified: Secondary | ICD-10-CM | POA: Diagnosis not present

## 2019-03-12 DIAGNOSIS — I10 Essential (primary) hypertension: Secondary | ICD-10-CM | POA: Diagnosis not present

## 2019-03-19 DIAGNOSIS — Z961 Presence of intraocular lens: Secondary | ICD-10-CM | POA: Diagnosis not present

## 2019-03-19 DIAGNOSIS — H401131 Primary open-angle glaucoma, bilateral, mild stage: Secondary | ICD-10-CM | POA: Diagnosis not present

## 2019-03-19 DIAGNOSIS — H26492 Other secondary cataract, left eye: Secondary | ICD-10-CM | POA: Diagnosis not present

## 2019-03-19 DIAGNOSIS — R195 Other fecal abnormalities: Secondary | ICD-10-CM | POA: Diagnosis not present

## 2019-03-19 DIAGNOSIS — H04123 Dry eye syndrome of bilateral lacrimal glands: Secondary | ICD-10-CM | POA: Diagnosis not present

## 2019-03-19 DIAGNOSIS — I10 Essential (primary) hypertension: Secondary | ICD-10-CM | POA: Diagnosis not present

## 2019-03-19 DIAGNOSIS — Z8719 Personal history of other diseases of the digestive system: Secondary | ICD-10-CM | POA: Diagnosis not present

## 2019-04-04 ENCOUNTER — Ambulatory Visit: Payer: Medicare HMO | Attending: Internal Medicine

## 2019-04-04 DIAGNOSIS — Z23 Encounter for immunization: Secondary | ICD-10-CM

## 2019-04-04 NOTE — Progress Notes (Signed)
   Covid-19 Vaccination Clinic  Name:  Amy Tapia    MRN: [id number] DOB: 25-Feb-1933  04/04/2019  [name] was observed post Covid-19 immunization for 30 minutes based on pre-vaccination screening without incident. She was provided with Vaccine Information Sheet and instruction to access the V-Safe system.   [name] was instructed to call 911 with any severe reactions post vaccine: Marland Kitchen Difficulty breathing  . Swelling of face and throat  . A fast heartbeat  . A bad rash all over body  . Dizziness and weakness   Immunizations Administered    Name Date Dose VIS Date Route   Pfizer COVID-19 Vaccine 04/04/2019  9:35 AM 0.3 mL 12/22/2018 Intramuscular   Manufacturer: [place]   Lot: [id number]   [place]: [id number]

## 2019-04-05 DIAGNOSIS — R195 Other fecal abnormalities: Secondary | ICD-10-CM | POA: Diagnosis not present

## 2019-04-05 DIAGNOSIS — R1030 Lower abdominal pain, unspecified: Secondary | ICD-10-CM | POA: Diagnosis not present

## 2019-04-05 DIAGNOSIS — Z8719 Personal history of other diseases of the digestive system: Secondary | ICD-10-CM | POA: Diagnosis not present

## 2019-04-05 DIAGNOSIS — R110 Nausea: Secondary | ICD-10-CM | POA: Diagnosis not present

## 2019-04-05 DIAGNOSIS — K5790 Diverticulosis of intestine, part unspecified, without perforation or abscess without bleeding: Secondary | ICD-10-CM | POA: Diagnosis not present

## 2019-04-17 DIAGNOSIS — Z1152 Encounter for screening for COVID-19: Secondary | ICD-10-CM | POA: Diagnosis not present

## 2019-04-23 DIAGNOSIS — K648 Other hemorrhoids: Secondary | ICD-10-CM | POA: Diagnosis not present

## 2019-04-23 DIAGNOSIS — Z86010 Personal history of colonic polyps: Secondary | ICD-10-CM | POA: Diagnosis not present

## 2019-04-23 DIAGNOSIS — R110 Nausea: Secondary | ICD-10-CM | POA: Diagnosis not present

## 2019-04-23 DIAGNOSIS — K2960 Other gastritis without bleeding: Secondary | ICD-10-CM | POA: Diagnosis not present

## 2019-04-23 DIAGNOSIS — K2900 Acute gastritis without bleeding: Secondary | ICD-10-CM | POA: Diagnosis not present

## 2019-04-23 DIAGNOSIS — K5730 Diverticulosis of large intestine without perforation or abscess without bleeding: Secondary | ICD-10-CM | POA: Diagnosis not present

## 2019-04-23 DIAGNOSIS — R195 Other fecal abnormalities: Secondary | ICD-10-CM | POA: Diagnosis not present

## 2019-04-23 DIAGNOSIS — K2980 Duodenitis without bleeding: Secondary | ICD-10-CM | POA: Diagnosis not present

## 2019-04-23 DIAGNOSIS — K2990 Gastroduodenitis, unspecified, without bleeding: Secondary | ICD-10-CM | POA: Diagnosis not present

## 2019-04-23 DIAGNOSIS — Z1211 Encounter for screening for malignant neoplasm of colon: Secondary | ICD-10-CM | POA: Diagnosis not present

## 2019-04-23 DIAGNOSIS — K449 Diaphragmatic hernia without obstruction or gangrene: Secondary | ICD-10-CM | POA: Diagnosis not present

## 2019-05-02 DIAGNOSIS — R197 Diarrhea, unspecified: Secondary | ICD-10-CM | POA: Diagnosis not present

## 2019-05-07 DIAGNOSIS — K5790 Diverticulosis of intestine, part unspecified, without perforation or abscess without bleeding: Secondary | ICD-10-CM | POA: Diagnosis not present

## 2019-05-07 DIAGNOSIS — K2960 Other gastritis without bleeding: Secondary | ICD-10-CM | POA: Diagnosis not present

## 2019-05-07 DIAGNOSIS — R197 Diarrhea, unspecified: Secondary | ICD-10-CM | POA: Diagnosis not present

## 2019-05-07 DIAGNOSIS — K2980 Duodenitis without bleeding: Secondary | ICD-10-CM | POA: Diagnosis not present

## 2019-05-10 ENCOUNTER — Other Ambulatory Visit: Payer: Self-pay | Admitting: Internal Medicine

## 2019-05-10 DIAGNOSIS — Z1231 Encounter for screening mammogram for malignant neoplasm of breast: Secondary | ICD-10-CM

## 2019-06-06 DIAGNOSIS — R197 Diarrhea, unspecified: Secondary | ICD-10-CM | POA: Diagnosis not present

## 2019-06-06 DIAGNOSIS — K2970 Gastritis, unspecified, without bleeding: Secondary | ICD-10-CM | POA: Diagnosis not present

## 2019-06-12 DIAGNOSIS — E039 Hypothyroidism, unspecified: Secondary | ICD-10-CM | POA: Diagnosis not present

## 2019-06-12 DIAGNOSIS — I10 Essential (primary) hypertension: Secondary | ICD-10-CM | POA: Diagnosis not present

## 2019-06-12 DIAGNOSIS — Z0000 Encounter for general adult medical examination without abnormal findings: Secondary | ICD-10-CM | POA: Diagnosis not present

## 2019-06-12 DIAGNOSIS — E789 Disorder of lipoprotein metabolism, unspecified: Secondary | ICD-10-CM | POA: Diagnosis not present

## 2019-06-19 ENCOUNTER — Ambulatory Visit
Admission: RE | Admit: 2019-06-19 | Discharge: 2019-06-19 | Disposition: A | Discharge status: Home / Self Care | Payer: Medicare HMO | Source: Ambulatory Visit | Attending: Internal Medicine | Admitting: Internal Medicine

## 2019-06-19 ENCOUNTER — Other Ambulatory Visit: Payer: Self-pay

## 2019-06-19 ENCOUNTER — Ambulatory Visit: Payer: Medicare HMO

## 2019-06-19 DIAGNOSIS — Z1231 Encounter for screening mammogram for malignant neoplasm of breast: Secondary | ICD-10-CM | POA: Diagnosis not present

## 2019-06-19 DIAGNOSIS — G4700 Insomnia, unspecified: Secondary | ICD-10-CM | POA: Diagnosis not present

## 2019-06-19 DIAGNOSIS — Z853 Personal history of malignant neoplasm of breast: Secondary | ICD-10-CM | POA: Diagnosis not present

## 2019-06-19 DIAGNOSIS — E039 Hypothyroidism, unspecified: Secondary | ICD-10-CM | POA: Diagnosis not present

## 2019-06-19 DIAGNOSIS — I10 Essential (primary) hypertension: Secondary | ICD-10-CM | POA: Diagnosis not present

## 2019-06-19 DIAGNOSIS — Z01419 Encounter for gynecological examination (general) (routine) without abnormal findings: Secondary | ICD-10-CM | POA: Diagnosis not present

## 2019-06-19 DIAGNOSIS — Z0000 Encounter for general adult medical examination without abnormal findings: Secondary | ICD-10-CM | POA: Diagnosis not present

## 2019-08-07 DIAGNOSIS — R197 Diarrhea, unspecified: Secondary | ICD-10-CM | POA: Diagnosis not present

## 2019-08-07 DIAGNOSIS — K2900 Acute gastritis without bleeding: Secondary | ICD-10-CM | POA: Diagnosis not present

## 2019-09-03 DIAGNOSIS — R69 Illness, unspecified: Secondary | ICD-10-CM | POA: Diagnosis not present

## 2019-09-19 DIAGNOSIS — Z961 Presence of intraocular lens: Secondary | ICD-10-CM | POA: Diagnosis not present

## 2019-09-19 DIAGNOSIS — H04123 Dry eye syndrome of bilateral lacrimal glands: Secondary | ICD-10-CM | POA: Diagnosis not present

## 2019-09-19 DIAGNOSIS — H401131 Primary open-angle glaucoma, bilateral, mild stage: Secondary | ICD-10-CM | POA: Diagnosis not present

## 2019-09-19 DIAGNOSIS — H26492 Other secondary cataract, left eye: Secondary | ICD-10-CM | POA: Diagnosis not present

## 2019-11-14 DIAGNOSIS — R69 Illness, unspecified: Secondary | ICD-10-CM | POA: Diagnosis not present

## 2019-12-24 DIAGNOSIS — I10 Essential (primary) hypertension: Secondary | ICD-10-CM | POA: Diagnosis not present

## 2019-12-24 DIAGNOSIS — S39012D Strain of muscle, fascia and tendon of lower back, subsequent encounter: Secondary | ICD-10-CM | POA: Diagnosis not present

## 2019-12-24 DIAGNOSIS — G4700 Insomnia, unspecified: Secondary | ICD-10-CM | POA: Diagnosis not present

## 2020-01-22 DIAGNOSIS — H409 Unspecified glaucoma: Secondary | ICD-10-CM | POA: Diagnosis not present

## 2020-01-22 DIAGNOSIS — I10 Essential (primary) hypertension: Secondary | ICD-10-CM | POA: Diagnosis not present

## 2020-01-22 DIAGNOSIS — G4700 Insomnia, unspecified: Secondary | ICD-10-CM | POA: Diagnosis not present

## 2020-01-22 DIAGNOSIS — E663 Overweight: Secondary | ICD-10-CM | POA: Diagnosis not present

## 2020-01-22 DIAGNOSIS — Z853 Personal history of malignant neoplasm of breast: Secondary | ICD-10-CM | POA: Diagnosis not present

## 2020-01-22 DIAGNOSIS — Z8249 Family history of ischemic heart disease and other diseases of the circulatory system: Secondary | ICD-10-CM | POA: Diagnosis not present

## 2020-01-22 DIAGNOSIS — Z008 Encounter for other general examination: Secondary | ICD-10-CM | POA: Diagnosis not present

## 2020-01-22 DIAGNOSIS — I951 Orthostatic hypotension: Secondary | ICD-10-CM | POA: Diagnosis not present

## 2020-03-20 DIAGNOSIS — H401131 Primary open-angle glaucoma, bilateral, mild stage: Secondary | ICD-10-CM | POA: Diagnosis not present

## 2020-03-20 DIAGNOSIS — H26492 Other secondary cataract, left eye: Secondary | ICD-10-CM | POA: Diagnosis not present

## 2020-03-20 DIAGNOSIS — Z961 Presence of intraocular lens: Secondary | ICD-10-CM | POA: Diagnosis not present

## 2020-03-20 DIAGNOSIS — R69 Illness, unspecified: Secondary | ICD-10-CM | POA: Diagnosis not present

## 2020-03-20 DIAGNOSIS — H04123 Dry eye syndrome of bilateral lacrimal glands: Secondary | ICD-10-CM | POA: Diagnosis not present

## 2020-04-22 DIAGNOSIS — L820 Inflamed seborrheic keratosis: Secondary | ICD-10-CM | POA: Diagnosis not present

## 2020-04-22 DIAGNOSIS — L309 Dermatitis, unspecified: Secondary | ICD-10-CM | POA: Diagnosis not present

## 2020-05-08 ENCOUNTER — Other Ambulatory Visit: Payer: Self-pay | Admitting: Internal Medicine

## 2020-05-08 DIAGNOSIS — Z1231 Encounter for screening mammogram for malignant neoplasm of breast: Secondary | ICD-10-CM

## 2020-06-18 DIAGNOSIS — I10 Essential (primary) hypertension: Secondary | ICD-10-CM | POA: Diagnosis not present

## 2020-06-18 DIAGNOSIS — Z0000 Encounter for general adult medical examination without abnormal findings: Secondary | ICD-10-CM | POA: Diagnosis not present

## 2020-06-18 DIAGNOSIS — E039 Hypothyroidism, unspecified: Secondary | ICD-10-CM | POA: Diagnosis not present

## 2020-06-18 DIAGNOSIS — E789 Disorder of lipoprotein metabolism, unspecified: Secondary | ICD-10-CM | POA: Diagnosis not present

## 2020-06-23 DIAGNOSIS — Z0000 Encounter for general adult medical examination without abnormal findings: Secondary | ICD-10-CM | POA: Diagnosis not present

## 2020-06-23 DIAGNOSIS — I10 Essential (primary) hypertension: Secondary | ICD-10-CM | POA: Diagnosis not present

## 2020-06-23 DIAGNOSIS — Z853 Personal history of malignant neoplasm of breast: Secondary | ICD-10-CM | POA: Diagnosis not present

## 2020-06-23 DIAGNOSIS — K625 Hemorrhage of anus and rectum: Secondary | ICD-10-CM | POA: Diagnosis not present

## 2020-06-23 DIAGNOSIS — Z01419 Encounter for gynecological examination (general) (routine) without abnormal findings: Secondary | ICD-10-CM | POA: Diagnosis not present

## 2020-06-23 DIAGNOSIS — G4700 Insomnia, unspecified: Secondary | ICD-10-CM | POA: Diagnosis not present

## 2020-06-23 DIAGNOSIS — K219 Gastro-esophageal reflux disease without esophagitis: Secondary | ICD-10-CM | POA: Diagnosis not present

## 2020-07-01 ENCOUNTER — Ambulatory Visit
Admission: RE | Admit: 2020-07-01 | Discharge: 2020-07-01 | Disposition: A | Discharge status: Home / Self Care | Payer: Medicare HMO | Source: Ambulatory Visit | Attending: Internal Medicine | Admitting: Internal Medicine

## 2020-07-01 ENCOUNTER — Other Ambulatory Visit: Payer: Self-pay

## 2020-07-01 DIAGNOSIS — K635 Polyp of colon: Secondary | ICD-10-CM | POA: Diagnosis not present

## 2020-07-01 DIAGNOSIS — K625 Hemorrhage of anus and rectum: Secondary | ICD-10-CM | POA: Diagnosis not present

## 2020-07-01 DIAGNOSIS — Z1231 Encounter for screening mammogram for malignant neoplasm of breast: Secondary | ICD-10-CM | POA: Diagnosis not present

## 2020-07-09 DIAGNOSIS — K625 Hemorrhage of anus and rectum: Secondary | ICD-10-CM | POA: Diagnosis not present

## 2020-07-09 DIAGNOSIS — K219 Gastro-esophageal reflux disease without esophagitis: Secondary | ICD-10-CM | POA: Diagnosis not present

## 2020-10-14 DIAGNOSIS — H401131 Primary open-angle glaucoma, bilateral, mild stage: Secondary | ICD-10-CM | POA: Diagnosis not present

## 2020-10-14 DIAGNOSIS — Z961 Presence of intraocular lens: Secondary | ICD-10-CM | POA: Diagnosis not present

## 2020-10-14 DIAGNOSIS — H26492 Other secondary cataract, left eye: Secondary | ICD-10-CM | POA: Diagnosis not present

## 2020-10-14 DIAGNOSIS — H04123 Dry eye syndrome of bilateral lacrimal glands: Secondary | ICD-10-CM | POA: Diagnosis not present

## 2021-02-23 ENCOUNTER — Ambulatory Visit (INDEPENDENT_AMBULATORY_CARE_PROVIDER_SITE_OTHER): Payer: No Typology Code available for payment source | Admitting: Nurse Practitioner

## 2021-02-23 ENCOUNTER — Other Ambulatory Visit: Payer: Self-pay

## 2021-02-23 ENCOUNTER — Encounter: Payer: Self-pay | Admitting: Nurse Practitioner

## 2021-02-23 VITALS — BP 130/82 | HR 60 | Temp 97.0°F | Ht 66.25 in | Wt 193.4 lb

## 2021-02-23 DIAGNOSIS — I10 Essential (primary) hypertension: Secondary | ICD-10-CM

## 2021-02-23 DIAGNOSIS — Z853 Personal history of malignant neoplasm of breast: Secondary | ICD-10-CM | POA: Diagnosis not present

## 2021-02-23 DIAGNOSIS — F5101 Primary insomnia: Secondary | ICD-10-CM

## 2021-02-23 DIAGNOSIS — H903 Sensorineural hearing loss, bilateral: Secondary | ICD-10-CM

## 2021-02-23 DIAGNOSIS — G4700 Insomnia, unspecified: Secondary | ICD-10-CM | POA: Insufficient documentation

## 2021-02-23 NOTE — Progress Notes (Signed)
Subjective:  Patient ID: Amy Tapia, female    DOB: 1933-10-11  Age: 88 y.o. MRN: [id number]  CC: Establish Care (New patient/ Pt would like medication refills on her medications (Ambien, Atenolol, and eye drops). )  HPI Amy Tapia also reports a hx of Diverticulosis, Gastritis, and breast caner with right mastectomy. Hx of herpetic zoster  Reviewed Colonoscopy and upper endoscopy completed in 2021: 3colon polyps: Dr. Angela Nevin.  Primary hypertension Chronic Current use of atenolol BP at goal Previous use of lisinopril 5mg . Med discontinued due to low BP BP Readings from Last 3 Encounters:  02/23/21 130/82  10/31/18 (!) 142/63  09/22/16 (!) 164/63   Maintain atenolol dose  Sensorineural hearing loss (SNHL) of both ears Chronic, worsening. Reports hx of recurrent ear infection and ruptured TMs. Normal ear canal and external ear Bilateral TM: cloudy, no effusion, no erythema.  Entered referral to audiology  Insomnia Chronic Current use of ambien daily at hs Denies any adverse side effects. Ambien 10mg  last filled 01/26/2021, 11/03/20, 06/23/20, #90 each  Maintain current medication  Personal history of malignant neoplasm of breast Needs annual mammogram. S/p hysterectomy 1973  Reviewed past Medical, Social and Family history today.  Outpatient Medications Prior to Visit  Medication Sig Dispense Refill   cyclobenzaprine (FLEXERIL) 10 MG tablet Take 10 mg by mouth daily as needed.     latanoprost (XALATAN) 0.005 % ophthalmic solution Place 1 drop into both eyes at bedtime.     lisinopril (ZESTRIL) 5 MG tablet Take 5 mg by mouth daily.     metroNIDAZOLE (FLAGYL) 500 MG tablet Take 500 mg by mouth 3 (three) times daily.     Ascorbic Acid (VITAMIN C) 100 MG tablet Take 100 mg by mouth daily.     aspirin 81 MG tablet Take 81 mg by mouth daily.     atenolol (TENORMIN) 25 MG tablet Take 25 mg by mouth 2 (two) times daily.      cholecalciferol (VITAMIN D) 1000 UNITS tablet  Take 1,000 Units by mouth daily.     Fish Oil-Cholecalciferol (FISH OIL + D3 PO) Take 1,000 mg by mouth daily.     loratadine (CLARITIN) 10 MG tablet Take 1 tablet (10 mg total) by mouth daily. (Patient taking differently: Take 10 mg by mouth daily as needed for allergies. ) 30 tablet 3   Misc Natural Products ([employer]S GLUCOS-CHONDROIT-MSM DS) TABS Take 1 tablet by mouth daily.     vitamin E 100 UNIT capsule Take 100 Units by mouth daily.     zolpidem (AMBIEN) 10 MG tablet Take 5 mg by mouth at bedtime as needed for sleep.     bimatoprost (LUMIGAN) 0.01 % SOLN Place 1 drop into both eyes at bedtime.     No facility-administered medications prior to visit.    ROS See HPI  Objective:  BP 130/82 (BP Location: Left Arm, Patient Position: Sitting, Cuff Size: Large)    Pulse 60    Temp (!) 97 F (36.1 C) (Temporal)    Ht 5' 6.25" (1.683 m)    Wt 193 lb 6.4 oz (87.7 kg)    SpO2 98%    BMI 30.98 kg/m   Physical Exam HENT:     Right Ear: Tympanic membrane, ear canal and external ear normal.     Left Ear: Tympanic membrane, ear canal and external ear normal.  Cardiovascular:     Rate and Rhythm: Normal rate.     Pulses: Normal pulses.     Heart  sounds: Normal heart sounds.  Pulmonary:     Effort: Pulmonary effort is normal.     Breath sounds: Normal breath sounds.  Musculoskeletal:     Cervical back: Normal range of motion and neck supple.     Right lower leg: No edema.     Left lower leg: No edema.  Lymphadenopathy:     Cervical: No cervical adenopathy.  Neurological:     Mental Status: Amy Tapia is alert and oriented to person, place, and time.  Psychiatric:        Mood and Affect: Mood normal.        Behavior: Behavior normal.   Assessment & Plan:  This visit occurred during the SARS-CoV-2 public health emergency.  Safety protocols were in place, including screening questions prior to the visit, additional usage of staff PPE, and extensive cleaning of exam room while observing appropriate  contact time as indicated for disinfecting solutions.   Amy Tapia was seen today for establish care.  Diagnoses and all orders for this visit:  Sensorineural hearing loss (SNHL) of both ears -     Ambulatory referral to Audiology  Primary insomnia  Primary hypertension  Personal history of malignant neoplasm of breast   Problem List Items Addressed This Visit       Cardiovascular and Mediastinum   Primary hypertension    Chronic Current use of atenolol BP at goal Previous use of lisinopril 5mg . Med discontinued due to low BP BP Readings from Last 3 Encounters:  02/23/21 130/82  10/31/18 (!) 142/63  09/22/16 (!) 164/63   Maintain atenolol dose      Relevant Medications   lisinopril (ZESTRIL) 5 MG tablet     Nervous and Auditory   Sensorineural hearing loss (SNHL) of both ears - Primary    Chronic, worsening. Reports hx of recurrent ear infection and ruptured TMs. Normal ear canal and external ear Bilateral TM: cloudy, no effusion, no erythema.  Entered referral to audiology      Relevant Orders   Ambulatory referral to Audiology     Other   Insomnia    Chronic Current use of ambien daily at hs Denies any adverse side effects. Ambien 10mg  last filled 01/26/2021, 11/03/20, 06/23/20, #90 each  Maintain current medication      Personal history of malignant neoplasm of breast    Needs annual mammogram. S/p hysterectomy 1973        Follow-up: Return for maintain upcoming appt.  Wilfred Lacy, NP

## 2021-02-23 NOTE — Patient Instructions (Addendum)
Thank you for choosing [facility]  Sign medical release form to get your records from previous providers (primary care provider and [facility])  Let me know when you need refill for atenolol and ambien.  You will be contacted to schedule appt with audiology.  Maintain upcoming appt for CPE

## 2021-02-24 ENCOUNTER — Encounter: Payer: Self-pay | Admitting: Nurse Practitioner

## 2021-02-24 DIAGNOSIS — E039 Hypothyroidism, unspecified: Secondary | ICD-10-CM | POA: Insufficient documentation

## 2021-02-24 NOTE — Assessment & Plan Note (Signed)
Needs annual mammogram. S/p hysterectomy 1973

## 2021-02-24 NOTE — Assessment & Plan Note (Signed)
Chronic, worsening. Reports hx of recurrent ear infection and ruptured TMs. Normal ear canal and external ear Bilateral TM: cloudy, no effusion, no erythema.  Entered referral to audiology

## 2021-02-24 NOTE — Assessment & Plan Note (Signed)
Chronic Current use of ambien daily at hs Denies any adverse side effects. Ambien 10mg  last filled 01/26/2021, 11/03/20, 06/23/20, #90 each  Maintain current medication

## 2021-02-24 NOTE — Assessment & Plan Note (Signed)
Chronic Current use of atenolol BP at goal Previous use of lisinopril 5mg . Med discontinued due to low BP BP Readings from Last 3 Encounters:  02/23/21 130/82  10/31/18 (!) 142/63  09/22/16 (!) 164/63   Maintain atenolol dose

## 2021-03-02 ENCOUNTER — Encounter: Payer: Self-pay | Admitting: Nurse Practitioner

## 2021-03-02 ENCOUNTER — Other Ambulatory Visit: Payer: Self-pay

## 2021-03-02 ENCOUNTER — Ambulatory Visit (INDEPENDENT_AMBULATORY_CARE_PROVIDER_SITE_OTHER): Payer: No Typology Code available for payment source | Admitting: Nurse Practitioner

## 2021-03-02 VITALS — BP 128/74 | HR 60 | Temp 97.9°F | Ht 66.25 in | Wt 191.2 lb

## 2021-03-02 DIAGNOSIS — E7800 Pure hypercholesterolemia, unspecified: Secondary | ICD-10-CM

## 2021-03-02 DIAGNOSIS — Z0000 Encounter for general adult medical examination without abnormal findings: Secondary | ICD-10-CM | POA: Diagnosis not present

## 2021-03-02 DIAGNOSIS — E039 Hypothyroidism, unspecified: Secondary | ICD-10-CM | POA: Diagnosis not present

## 2021-03-02 LAB — COMPREHENSIVE METABOLIC PANEL
ALT: 15 U/L (ref 0–35)
AST: 14 U/L (ref 0–37)
Albumin: 4.2 g/dL (ref 3.5–5.2)
Alkaline Phosphatase: 58 U/L (ref 39–117)
BUN: 16 mg/dL (ref 6–23)
CO2: 32 mEq/L (ref 19–32)
Calcium: 9.3 mg/dL (ref 8.4–10.5)
Chloride: 105 mEq/L (ref 96–112)
Creatinine, Ser: 0.83 mg/dL (ref 0.40–1.20)
GFR: 64.38 mL/min (ref >60.00)
Glucose, Bld: 115 mg/dL — ABNORMAL HIGH (ref 70–99)
Potassium: 4.3 mEq/L (ref 3.5–5.1)
Sodium: 140 mEq/L (ref 135–145)
Total Bilirubin: 0.5 mg/dL (ref 0.2–1.2)
Total Protein: 6.9 g/dL (ref 6.0–8.3)

## 2021-03-02 LAB — TSH: TSH: 2.58 u[IU]/mL (ref 0.35–5.50)

## 2021-03-02 LAB — LIPID PANEL
Cholesterol: 208 mg/dL — ABNORMAL HIGH (ref 0–200)
HDL: 65.8 mg/dL (ref >39.00)
LDL Cholesterol: 116 mg/dL — ABNORMAL HIGH (ref 0–99)
NonHDL: 142.61
Total CHOL/HDL Ratio: 3
Triglycerides: 131 mg/dL (ref 0.0–149.0)
VLDL: 26.2 mg/dL (ref 0.0–40.0)

## 2021-03-02 LAB — T4, FREE: Free T4: 0.84 ng/dL (ref 0.60–1.60)

## 2021-03-02 NOTE — Progress Notes (Signed)
Subjective:    Patient ID: Amy Tapia, female    DOB: 03/11/1933, 88 y.o.   MRN: [id number]  Patient presents today for CPE   HPI Denies any acute compliant today.  Vision:up to date Dental:up to date Diet:regular. Exercise:walking Weight:  Wt Readings from Last 3 Encounters:  03/02/21 191 lb 3.2 oz (86.7 kg)  02/23/21 193 lb 6.4 oz (87.7 kg)  09/22/16 175 lb (79.4 kg)   Sexual History (orientation,birth control, marital status, STD):s/p hysterectomy, hx of breast cancer, she opted to continue annual mammogram and dexa scan every 2-59yrs  No change in GI/GU function. Hx of colon polyps. Colonoscopy and upper endpscopy last completed 2021: 3colon polyps: Dr. Angela Nevin.  Depression/Suicide: Depression screen [facility] 2/9 02/23/2021  Decreased Interest 0  Down, Depressed, Hopeless 0  PHQ - 2 Score 0   Immunizations: (TDAP, Hep C screen, Pneumovax, Influenza, zoster)  Health Maintenance  Topic Date Due   DEXA scan (bone density measurement)  Never done   COVID-19 Vaccine (3 - Booster for Pfizer series) 05/30/2019   Flu Shot  04/10/2021*   Zoster (Shingles) Vaccine (1 of 2) 05/23/2021*   Tetanus Vaccine  02/23/2022*   Pneumonia Vaccine  Completed   HPV Vaccine  Aged Out  *Topic was postponed. The date shown is not the original due date.   Fall Risk: Fall Risk  02/23/2021  Falls in the past year? 0  Number falls in past yr: 0  Injury with Fall? 0  Risk for fall due to : No Fall Risks  Follow up Falls evaluation completed   Advanced Directive: Advanced Directives 03/02/2021  Does Patient Have a Medical Advance Directive? No  Would patient like information on creating a medical advance directive? Yes (MAU/Ambulatory/Procedural Areas - Information given)    Medications and allergies reviewed with patient and updated if appropriate.  Patient Active Problem List   Diagnosis Date Noted   Hypothyroid 02/24/2021   Personal history of malignant neoplasm of breast  02/23/2021   Sensorineural hearing loss (SNHL) of both ears 02/23/2021   Insomnia 02/23/2021   Primary hypertension 05/01/2014   Hyperlipidemia, unspecified 05/01/2014   Skipped heart beats 05/01/2014    Current Outpatient Medications on File Prior to Visit  Medication Sig Dispense Refill   Ascorbic Acid (VITAMIN C) 100 MG tablet Take 100 mg by mouth daily.     aspirin 81 MG tablet Take 81 mg by mouth daily.     atenolol (TENORMIN) 25 MG tablet Take 25 mg by mouth 2 (two) times daily.      cholecalciferol (VITAMIN D) 1000 UNITS tablet Take 1,000 Units by mouth daily.     cyclobenzaprine (FLEXERIL) 10 MG tablet Take 10 mg by mouth daily as needed.     Fish Oil-Cholecalciferol (FISH OIL + D3 PO) Take 1,000 mg by mouth daily.     latanoprost (XALATAN) 0.005 % ophthalmic solution Place 1 drop into both eyes at bedtime.     lisinopril (ZESTRIL) 5 MG tablet Take 5 mg by mouth daily.     loratadine (CLARITIN) 10 MG tablet Take 1 tablet (10 mg total) by mouth daily. (Patient taking differently: Take 10 mg by mouth daily as needed for allergies.) 30 tablet 3   metroNIDAZOLE (FLAGYL) 500 MG tablet Take 500 mg by mouth 3 (three) times daily.     Misc Natural Products ([employer] GLUCOS-CHONDROIT-MSM DS) TABS Take 1 tablet by mouth daily.     vitamin E 100 UNIT capsule Take 100 Units by  mouth daily.     zolpidem (AMBIEN) 10 MG tablet Take 5 mg by mouth at bedtime as needed for sleep.     No current facility-administered medications on file prior to visit.   Past Medical History:  Diagnosis Date   Arthritis    "maybe a little bit in my knees" (05/02/2014)   Breast cancer, right breast (HCC)    Glaucoma of both eyes    HLD (hyperlipidemia)    Hypertension    Past Surgical History:  Procedure Laterality Date   ABDOMINAL HYSTERECTOMY  09/1971   BREAST BIOPSY Right 08/1970   CATARACT EXTRACTION W/ INTRAOCULAR LENS IMPLANT Right 04/2013   MASTECTOMY Right 08/1970   TONSILLECTOMY     TYMPANOPLASTY  Bilateral ~ 8676   "had eardrums patched w/vein from my arms"    Social History   Socioeconomic History   Marital status: Married    Spouse name: Not on file   Number of children: Not on file   Years of education: Not on file   Highest education level: Not on file  Occupational History   Not on file  Tobacco Use   Smoking status: Never   Smokeless tobacco: Never  Vaping Use   Vaping Use: Never used  Substance and Sexual Activity   Alcohol use: No   Drug use: No   Sexual activity: Not Currently  Other Topics Concern   Not on file  Social History Narrative   Not on file   Social Determinants of Health   Financial Resource Strain: Not on file  Food Insecurity: Not on file  Transportation Needs: Not on file  Physical Activity: Not on file  Stress: Not on file  Social Connections: Not on file   Family History  Problem Relation Age of Onset   Glaucoma Mother    Hyperthyroidism Mother    Heart disease Brother    Rheumatic fever Sister    Arthritis Sister    Breast cancer Maternal Aunt        in 76's       Review of Systems  Constitutional:  Negative for fever, malaise/fatigue and weight loss.  HENT:  Positive for hearing loss. Negative for congestion and sore throat.        Has appt with audiology 03/03/2021  Eyes:        Negative for visual changes  Respiratory:  Negative for cough and shortness of breath.   Cardiovascular:  Negative for chest pain, palpitations and leg swelling.  Gastrointestinal:  Negative for blood in stool, constipation, diarrhea and heartburn.  Genitourinary:  Negative for dysuria, frequency and urgency.  Musculoskeletal:  Negative for falls, joint pain and myalgias.  Skin:  Negative for rash.  Neurological:  Negative for dizziness, sensory change and headaches.  Endo/Heme/Allergies:  Does not bruise/bleed easily.  Psychiatric/Behavioral:  Negative for depression, substance abuse and suicidal ideas. The patient is not nervous/anxious.     Objective:   Vitals:   03/02/21 1106  BP: 128/74  Pulse: 60  Temp: 97.9 F (36.6 C)  SpO2: 98%   Body mass index is 30.63 kg/m.  Physical Examination:  Physical Exam Vitals reviewed.  Constitutional:      General: She is not in acute distress.    Appearance: She is well-developed. She is obese.  HENT:     Right Ear: Ear canal and external ear normal. No middle ear effusion. There is no impacted cerumen. No mastoid tenderness. Tympanic membrane is scarred. Tympanic membrane is not perforated, erythematous,  retracted or bulging.     Left Ear: Ear canal and external ear normal.  No middle ear effusion. There is no impacted cerumen. No mastoid tenderness. Tympanic membrane is scarred. Tympanic membrane is not perforated, erythematous, retracted or bulging.  Eyes:     General: Lids are normal.     Extraocular Movements: Extraocular movements intact.     Conjunctiva/sclera: Conjunctivae normal.     Pupils: Pupils are equal, round, and reactive to light.  Cardiovascular:     Rate and Rhythm: Normal rate and regular rhythm.     Pulses: Normal pulses.     Heart sounds: Normal heart sounds.  Pulmonary:     Effort: Pulmonary effort is normal. No respiratory distress.     Breath sounds: Normal breath sounds.  Chest:     Chest wall: No tenderness.  Musculoskeletal:        General: Normal range of motion.     Cervical back: Normal range of motion and neck supple.     Right lower leg: No edema.     Left lower leg: No edema.  Lymphadenopathy:     Cervical: No cervical adenopathy.  Skin:    General: Skin is warm and dry.  Neurological:     General: No focal deficit present.     Mental Status: She is alert and oriented to person, place, and time.     Cranial Nerves: Cranial nerves 2-12 are intact.     Sensory: Sensation is intact.     Motor: Motor function is intact.     Coordination: Coordination is intact.     Gait: Gait is intact.     Deep Tendon Reflexes: Reflexes are  normal and symmetric.  Psychiatric:        Mood and Affect: Mood normal.        Behavior: Behavior normal.        Thought Content: Thought content normal.   ASSESSMENT and PLAN: This visit occurred during the SARS-CoV-2 public health emergency.  Safety protocols were in place, including screening questions prior to the visit, additional usage of staff PPE, and extensive cleaning of exam room while observing appropriate contact time as indicated for disinfecting solutions.   Amy Tapia was seen today for annual exam.  Diagnoses and all orders for this visit:  Preventative health care -     Comprehensive metabolic panel -     CBC with Differential/Platelet  Pure hypercholesterolemia -     Lipid panel  Hypothyroidism, unspecified type -     TSH -     T4, free  Please complete living will and health care power of attorney forms. Have forms notarized by an [place]. Bring a copy to be scanned into your chart. Advised to maintain regular exercise and heart healthy diet. Schedule annual screening mammogram in June Will review records to determine date of next dexa scan.     Problem List Items Addressed This Visit       Endocrine   Hypothyroid   Relevant Orders   TSH   T4, free     Other   Hyperlipidemia, unspecified   Relevant Orders   Lipid panel   Other Visit Diagnoses     Preventative health care    -  Primary   Relevant Orders   Comprehensive metabolic panel   CBC with Differential/Platelet       Follow up: Return in about 6 months (around 08/30/2021) for HTN.  Wilfred Lacy, NP

## 2021-03-02 NOTE — Patient Instructions (Addendum)
Please complete living will and health care power of attorney forms. Have forms notarized by an [place]. Bring a copy to be scanned into your chart.  Go to lab for blood draw.  Will review records to determine date of next dexa scan.  Exercise Information for Aging Adults Staying physically active is important as you age. Physical activity and exercise can help in maintaining quality of life, health, physical function, and reducing falls. The four types of exercises that are best for older adults are endurance, strength, balance, and flexibility. Contact your health care provider before you start any exercise routine. Ask your health care provider what activities are safe for you. What are the risks? Risks associated with exercising include: Overdoing it. This may lead to sore muscles or fatigue. Falls. Injuries. Dehydration. How to do these exercises Endurance exercises Endurance (aerobic) exercises raise your breathing rate and heart rate. Increasing your endurance helps you do everyday tasks and stay healthy. By improving the health of your body system that includes your heart, lungs, and blood vessels (circulatory system), you may also delay or prevent diseases such as heart disease, diabetes, and weak bones (osteoporosis). Types of endurance exercises include: Sports. Indoor activities, such as using gym equipment, doing water aerobics, or dancing. Outdoor activities, such as biking or jogging. Tasks around the house, such as gardening, yard work, and heavy household chores like cleaning. Walking, such as hiking or walking around your neighborhood. When doing endurance exercises, make sure you: Are aware of your surroundings. Use safety equipment as directed. Dress in layers when exercising outdoors. Drink plenty of water to stay well hydrated. Build up endurance slowly. Start with 10 minutes at a time, and gradually build up to doing 30 minutes at a time. Unless your health care  provider gave you different instructions, aim to exercise for a total of 150 minutes a week. Spread out that time so you are working on endurance 3 or more days a week. Strength exercises Lifting, pulling, or pushing weights helps to strengthen muscles. Having stronger muscles makes it easier to do everyday activities, such as getting up from a chair, climbing stairs, carrying groceries, and playing with grandchildren. Strength exercises include arm and leg exercises that may be done: With weights. Without weights (using your own body weight). With a resistance band. When doing strength exercises: Move smoothly and steadily. Do not suddenly thrust or jerk the weights, the resistance band, or your body. Start with no weights or with light weights, and gradually add more weight over time. Eventually, aim to use weights that are hard or very hard for you to lift. This means that you are able to do 8 repetitions with the weight, and the last few repetitions are very challenging. Lift or push weights into position for 3 seconds, hold the position for 1 second, and then take 3 seconds to return to your starting position. Breathe out (exhale) during difficult movements, like lifting or pushing weights. Breathe in (inhale) to relax your muscles before the next repetition. Consider alternating arms or legs, especially when you first start strength exercises. Expect some slight muscle soreness after each session. Do strength exercises on 2 or more days a week, for 30 minutes at a time. Avoid exercising the same muscle groups two days in a row. For example, if you work on your leg muscles one day, work on your arm muscles the next day. When you can do two sets of 10-15 repetitions with a certain weight, increase the amount  of weight. Balance exercises Balance exercises can help to prevent falls. Balance exercises include: Standing on one foot. Heel-to-toe walk. Balance walk. Tai chi. Make sure you have  something sturdy to hold onto while doing balance exercises, such as a sturdy chair. As your balance improves, challenge yourself by holding on to the chair with one hand instead of two, and then with no hands. Trying exercises with your eyes closed also challenges your balance, but be sure to have a sturdy surface (like a countertop) close by in case you need it. Do balance exercises as often as you want, or as often as directed by your health care provider. Flexibility exercises Flexibility exercises improve how far you can bend, straighten, move, or rotate parts of your body (range of motion). These exercises also help you do everyday activities such as getting dressed or reaching for objects. Flexibility exercises include stretching different parts of the body, and they may be done in a standing or seated position or on the floor. When stretching, make sure you: Keep a slight bend in your arms and legs. Avoid completely straightening ("locking") your joints. Do not stretch so far that you feel pain. You should feel a mild stretching feeling. You may try stretching farther as you become more flexible over time. Relax and breathe between stretches. Hold on to something sturdy for balance as needed. Hold each stretch for 10-30 seconds. Repeat each stretch 3-5 times. General safety tips Exercise in well-lit areas. Do not hold your breath during exercises or stretches. Warm up before exercising, and cool down after exercising. This can help prevent injury. Drink plenty of water during exercise or any activity that makes you sweat. If you are not sure if an exercise is safe for you, or you are not sure how to do an exercise, talk with your health care provider. This is especially important if you have had surgery on muscles, bones, or joints (orthopedic surgery). Where to find more information You can find more information about exercise for older adults from: Your local health department, fitness  center, or community center. These facilities may have programs for aging adults. [employer] on Aging: http://kim-miller.com/ National Council on Aging: www.ncoa.org Summary Staying physically active is important as you age. Doing endurance, strength, balance, and flexibility exercises can help in maintaining quality of life, health, physical function, and reducing falls. Make sure to contact your health care provider before you start any exercise routine. Ask your health care provider what activities are safe for you. This information is not intended to replace advice given to you by your health care provider. Make sure you discuss any questions you have with your health care provider. Document Revised: 05/12/2020 Document Reviewed: 05/12/2020 Elsevier Patient Education  [place].

## 2021-03-03 ENCOUNTER — Other Ambulatory Visit: Payer: Self-pay

## 2021-03-03 ENCOUNTER — Other Ambulatory Visit: Payer: No Typology Code available for payment source

## 2021-03-03 ENCOUNTER — Ambulatory Visit: Payer: No Typology Code available for payment source | Attending: Nurse Practitioner | Admitting: Audiologist

## 2021-03-03 DIAGNOSIS — H906 Mixed conductive and sensorineural hearing loss, bilateral: Secondary | ICD-10-CM | POA: Diagnosis not present

## 2021-03-03 LAB — CBC WITH DIFFERENTIAL/PLATELET
Basophils Absolute: 0.1 10*3/uL (ref 0.0–0.1)
Basophils Relative: 0.9 % (ref 0.0–3.0)
Eosinophils Absolute: 0.5 10*3/uL (ref 0.0–0.7)
Eosinophils Relative: 8.3 % — ABNORMAL HIGH (ref 0.0–5.0)
HCT: 42.8 % (ref 36.0–46.0)
Hemoglobin: 13.9 g/dL (ref 12.0–15.0)
Lymphocytes Relative: 40.2 % (ref 12.0–46.0)
Lymphs Abs: 2.4 10*3/uL (ref 0.7–4.0)
MCHC: 32.5 g/dL (ref 30.0–36.0)
MCV: 90.9 fl (ref 78.0–100.0)
Monocytes Absolute: 0.5 10*3/uL (ref 0.1–1.0)
Monocytes Relative: 8.7 % (ref 3.0–12.0)
Neutro Abs: 2.5 10*3/uL (ref 1.4–7.7)
Neutrophils Relative %: 41.9 % — ABNORMAL LOW (ref 43.0–77.0)
Platelets: 256 10*3/uL (ref 150.0–400.0)
RBC: 4.7 Mil/uL (ref 3.87–5.11)
RDW: 13.9 % (ref 11.5–15.5)
WBC: 6.1 10*3/uL (ref 4.0–10.5)

## 2021-03-03 NOTE — Addendum Note (Signed)
Addended by: Lynnea Ferrier on: 03/03/2021 07:53 AM   Modules accepted: Orders

## 2021-03-03 NOTE — Procedures (Signed)
°  Outpatient Audiology and [place], [place]  [id number] [phone]  AUDIOLOGICAL  EVALUATION  NAME: Amy Tapia     DOB:   09-28-1933      MRN: [id number]                                                                                     DATE: 03/03/2021     REFERENT: Wilfred Lacy, NP STATUS: Outpatient DIAGNOSIS: moderate sloping to severe mixed hearing loss in both ears   History: Amy Tapia was seen for an audiological evaluation due to history of middle ear complications and at the request of her family. Amy Tapia stated she has an extensive history of ear infections that required a tympanoplasty in the 1960's. She continued to have ear infections into adulthood with her last one being a few years ago. She was followed by ENT but has not seen her ENT physician in the last few years due to not having any ear infections. Amy Tapia denied any pain, pressure, fullness, and ringing at today's appointment. Medical history complicated by hypertension. Amy Tapia reported listening difficulties in most listening environments especially when she cannot see the speakers face.   Evaluation:  Otoscopy showed a clear view of the tympanic membranes, bilaterally Tympanometry results were consistent with abnormal middle ear mobility in the right ear consistent with a tympanoplasty and normal middle ear mobility in the left ear. Audiometric testing was completed using Conventional Audiometry techniques with insert earphones. Test results are consistent with moderate sloping to severe mixed hearing loss, bilaterally. Speech Recognition Thresholds were obtained at 65 dB HL in the right ear and at 65  dB HL in the left ear. Word Recognition Testing was completed at 95 dB HL with masking and Amy Tapia scored 76% in the right ear and 72% in the left ear.    Results:  The test results were reviewed with Amy Tapia and that she has a moderate sloping to severe mixed hearing loss, bilaterally. Amy Tapia is  a hearing aid candidate and was given a list of hearing aid providers in the area that could work with her if she chooses to do so. She was instructed to follow up with an ENT due her extensive history of ear infections and mixed hearing loss.    Recommendations: 1.   Follow up with ENT to receive medical clearance before pursuing hearing aids    If you have any questions please feel free to contact me at [phone].  [address] [place]  Audiology Intern  Alfonse Alpers Audiologist, Au.D., CCC-A 03/03/2021  10:32 AM  Cc: Libby Maw, MD

## 2021-03-03 NOTE — Addendum Note (Signed)
Addended by: Lynnea Ferrier on: 03/03/2021 07:49 AM   Modules accepted: Orders

## 2021-04-20 DIAGNOSIS — H401131 Primary open-angle glaucoma, bilateral, mild stage: Secondary | ICD-10-CM | POA: Diagnosis not present

## 2021-04-20 DIAGNOSIS — H35363 Drusen (degenerative) of macula, bilateral: Secondary | ICD-10-CM | POA: Diagnosis not present

## 2021-04-20 DIAGNOSIS — H26492 Other secondary cataract, left eye: Secondary | ICD-10-CM | POA: Diagnosis not present

## 2021-04-20 DIAGNOSIS — Z961 Presence of intraocular lens: Secondary | ICD-10-CM | POA: Diagnosis not present

## 2021-04-20 LAB — HM DIABETES EYE EXAM

## 2021-04-21 ENCOUNTER — Ambulatory Visit (INDEPENDENT_AMBULATORY_CARE_PROVIDER_SITE_OTHER): Payer: No Typology Code available for payment source

## 2021-04-21 DIAGNOSIS — Z0000 Encounter for general adult medical examination without abnormal findings: Secondary | ICD-10-CM

## 2021-04-21 NOTE — Progress Notes (Signed)
? ?Subjective:  ? Amy Tapia is a 88 y.o. female who presents for an Initial Medicare Annual Wellness Visit. ? ?I connected with Arrielle Mcginn today by telephone and verified that I am speaking with the correct person using two identifiers. ?Location patient: home ?Location provider: work ?Persons participating in the virtual visit: patient, provider. ?  ?I discussed the limitations, risks, security and privacy concerns of performing an evaluation and management service by telephone and the availability of in person appointments. I also discussed with the patient that there may be a patient responsible charge related to this service. The patient expressed understanding and verbally consented to this telephonic visit.  ?  ?Interactive audio and video telecommunications were attempted between this provider and patient, however failed, due to patient having technical difficulties OR patient did not have access to video capability.  We continued and completed visit with audio only. ? ?  ?Review of Systems    ? ?Cardiac Risk Factors include: advanced age (>24mn, >>79women);hypertension ? ?   ?Objective:  ?  ?Today's Vitals  ? ?There is no height or weight on file to calculate BMI. ? ? ?  04/21/2021  ? 11:18 AM 03/02/2021  ? 12:03 PM 10/30/2018  ? 10:32 PM 09/22/2016  ? 11:52 AM 05/03/2014  ? 12:24 PM 05/01/2014  ?  6:52 PM 10/10/2013  ?  9:32 PM  ?Advanced Directives  ?Does Patient Have a Medical Advance Directive? No No No No No No No  ?Does patient want to make changes to medical advance directive? No - Patient declined        ?Would patient like information on creating a medical advance directive?  Yes (MAU/Ambulatory/Procedural Areas - Information given)   No - patient declined information No - patient declined information Yes - Educational materials given  ? ? ?Current Medications (verified) ?Outpatient Encounter Medications as of 04/21/2021  ?Medication Sig  ? Ascorbic Acid (VITAMIN C) 100 MG tablet Take 100 mg by  mouth daily.  ? aspirin 81 MG tablet Take 81 mg by mouth daily.  ? atenolol (TENORMIN) 25 MG tablet Take 25 mg by mouth 2 (two) times daily.   ? cholecalciferol (VITAMIN D) 1000 UNITS tablet Take 1,000 Units by mouth daily.  ? cyclobenzaprine (FLEXERIL) 10 MG tablet Take 10 mg by mouth daily as needed.  ? Fish Oil-Cholecalciferol (FISH OIL + D3 PO) Take 1,000 mg by mouth daily.  ? latanoprost (XALATAN) 0.005 % ophthalmic solution Place 1 drop into both eyes at bedtime.  ? lisinopril (ZESTRIL) 5 MG tablet Take 5 mg by mouth daily.  ? loratadine (CLARITIN) 10 MG tablet Take 1 tablet (10 mg total) by mouth daily. (Patient taking differently: Take 10 mg by mouth daily as needed for allergies.)  ? Misc Natural Products ([facility] GLUCOS-CHONDROIT-MSM DS) TABS Take 1 tablet by mouth daily.  ? vitamin E 100 UNIT capsule Take 100 Units by mouth daily.  ? zolpidem (AMBIEN) 10 MG tablet Take 5 mg by mouth at bedtime as needed for sleep.  ? metroNIDAZOLE (FLAGYL) 500 MG tablet Take 500 mg by mouth 3 (three) times daily. (Patient not taking: Reported on 04/21/2021)  ? ?No facility-administered encounter medications on file as of 04/21/2021.  ? ? ?Allergies (verified) ?Penicillins, Sular [nisoldipine], and Amlodipine besylate  ? ?History: ?Past Medical History:  ?Diagnosis Date  ? Arthritis   ? "maybe a little bit in my knees" (05/02/2014)  ? Breast cancer, right breast ([facility]   ? Glaucoma of both eyes   ?  HLD (hyperlipidemia)   ? Hypertension   ? ?Past Surgical History:  ?Procedure Laterality Date  ? ABDOMINAL HYSTERECTOMY  09/1971  ? BREAST BIOPSY Right 08/1970  ? CATARACT EXTRACTION W/ INTRAOCULAR LENS IMPLANT Right 04/2013  ? MASTECTOMY Right 08/1970  ? TONSILLECTOMY    ? TYMPANOPLASTY Bilateral ~ 1963  ? "had eardrums patched w/vein from my arms"  ? ?Family History  ?Problem Relation Age of Onset  ? Glaucoma Mother   ? Hyperthyroidism Mother   ? Heart disease Brother   ? Rheumatic fever Sister   ? Arthritis Sister   ? Breast cancer  Maternal Aunt   ?     in 90's  ? ?Social History  ? ?Socioeconomic History  ? Marital status: Married  ?  Spouse name: Not on file  ? Number of children: Not on file  ? Years of education: Not on file  ? Highest education level: Not on file  ?Occupational History  ? Not on file  ?Tobacco Use  ? Smoking status: Never  ? Smokeless tobacco: Never  ?Vaping Use  ? Vaping Use: Never used  ?Substance and Sexual Activity  ? Alcohol use: No  ? Drug use: No  ? Sexual activity: Not Currently  ?Other Topics Concern  ? Not on file  ?Social History Narrative  ? Not on file  ? ?Social Determinants of Health  ? ?Financial Resource Strain: Low Risk   ? Difficulty of Paying Living Expenses: Not hard at all  ?Food Insecurity: No Food Insecurity  ? Worried About Charity fundraiser in the Last Year: Never true  ? Ran Out of Food in the Last Year: Never true  ?Transportation Needs: No Transportation Needs  ? Lack of Transportation (Medical): No  ? Lack of Transportation (Non-Medical): No  ?Physical Activity: Inactive  ? Days of Exercise per Week: 0 days  ? Minutes of Exercise per Session: 0 min  ?Stress: No Stress Concern Present  ? Feeling of Stress : Not at all  ?Social Connections: Moderately Integrated  ? Frequency of Communication with Friends and Family: Twice a week  ? Frequency of Social Gatherings with Friends and Family: Twice a week  ? Attends Religious Services: More than 4 times per year  ? Active Member of Clubs or Organizations: No  ? Attends Archivist Meetings: Never  ? Marital Status: Married  ? ? ?Tobacco Counseling ?Counseling given: Not Answered ? ? ?Clinical Intake: ? ?Pre-visit preparation completed: Yes ? ?Pain : No/denies pain ? ?  ? ?Nutritional Risks: None ?Diabetes: No ? ?How often do you need to have someone help you when you read instructions, pamphlets, or other written materials from your doctor or pharmacy?: 1 - Never ?What is the last grade level you completed in school?: High  School ? ?Diabetic?no  ? ?Interpreter Needed?: No ? ?Information entered by :: O.MVEHM,CNO ? ? ?Activities of Daily Living ? ?  04/21/2021  ? 11:22 AM  ?In your present state of health, do you have any difficulty performing the following activities:  ?Hearing? 0  ?Vision? 0  ?Difficulty concentrating or making decisions? 0  ?Walking or climbing stairs? 0  ?Dressing or bathing? 0  ?Doing errands, shopping? 0  ?Preparing Food and eating ? N  ?Using the Toilet? N  ?In the past six months, have you accidently leaked urine? N  ?Do you have problems with loss of bowel control? N  ?Managing your Medications? N  ?Managing your Finances? N  ?Housekeeping or  managing your Housekeeping? N  ? ? ?Patient Care Team: ?Libby Maw, MD as PCP - General (Family Medicine) ? ?Indicate any recent Medical Services you may have received from other than [facility] providers in the past year (date may be approximate). ? ?   ?Assessment:  ? This is a routine wellness examination for Gaby. ? ?Hearing/Vision screen ?Vision Screening - Comments:: Annual eye exams wear glasses  ? ?Dietary issues and exercise activities discussed: ?Current Exercise Habits: The patient does not participate in regular exercise at present, Exercise limited by: None identified ? ? Goals Addressed   ?None ?  ? ?Depression Screen ? ?  04/21/2021  ? 11:19 AM 04/21/2021  ? 11:17 AM 02/23/2021  ?  1:12 PM  ?PHQ 2/9 Scores  ?PHQ - 2 Score 0 0 0  ?  ?Fall Risk ? ?  04/21/2021  ? 11:19 AM 02/23/2021  ?  1:14 PM  ?Fall Risk   ?Falls in the past year? 0 0  ?Number falls in past yr: 0 0  ?Injury with Fall? 0 0  ?Risk for fall due to :  No Fall Risks  ?Follow up Falls evaluation completed Falls evaluation completed  ? ? ?FALL RISK PREVENTION PERTAINING TO THE HOME: ? ?Any stairs in or around the home? No  ?If so, are there any without handrails? No  ?Home free of loose throw rugs in walkways, pet beds, electrical cords, etc? Yes  ?Adequate lighting in your home to reduce risk of  falls? Yes  ? ?ASSISTIVE DEVICES UTILIZED TO PREVENT FALLS: ? ?Life alert? No  ?Use of a cane, walker or w/c? No  ?Grab bars in the bathroom? Yes  ?Shower chair or bench in shower? Yes  ?Elevated toilet seat or a handicapped

## 2021-04-21 NOTE — Patient Instructions (Signed)
Ms. Swint , ?Thank you for taking time to come for your Medicare Wellness Visit. I appreciate your ongoing commitment to your health goals. Please review the following plan we discussed and let me know if I can assist you in the future.  ? ?Screening recommendations/referrals: ?Colonoscopy: no longer required  ?Mammogram: no longer required  ?Bone Density: declined  ?Recommended yearly ophthalmology/optometry visit for glaucoma screening and checkup ?Recommended yearly dental visit for hygiene and checkup ? ?Vaccinations: ?Influenza vaccine: declined  ?Pneumococcal vaccine: completed  ?Tdap vaccine: due  ?Shingles vaccine: will consider    ? ?Advanced directives: none  ? ?Conditions/risks identified: none  ? ?Next appointment: none  ? ? ?Preventive Care 86 Years and Older, Female ?Preventive care refers to lifestyle choices and visits with your health care provider that can promote health and wellness. ?What does preventive care include? ?A yearly physical exam. This is also called an annual well check. ?Dental exams once or twice a year. ?Routine eye exams. Ask your health care provider how often you should have your eyes checked. ?Personal lifestyle choices, including: ?Daily care of your teeth and gums. ?Regular physical activity. ?Eating a healthy diet. ?Avoiding tobacco and drug use. ?Limiting alcohol use. ?Practicing safe sex. ?Taking low-dose aspirin every day. ?Taking vitamin and mineral supplements as recommended by your health care provider. ?What happens during an annual well check? ?The services and screenings done by your health care provider during your annual well check will depend on your age, overall health, lifestyle risk factors, and family history of disease. ?Counseling  ?Your health care provider may ask you questions about your: ?Alcohol use. ?Tobacco use. ?Drug use. ?Emotional well-being. ?Home and relationship well-being. ?Sexual activity. ?Eating habits. ?History of falls. ?Memory and  ability to understand (cognition). ?Work and work Statistician. ?Reproductive health. ?Screening  ?You may have the following tests or measurements: ?Height, weight, and BMI. ?Blood pressure. ?Lipid and cholesterol levels. These may be checked every 5 years, or more frequently if you are over 30 years old. ?Skin check. ?Lung cancer screening. You may have this screening every year starting at age 57 if you have a 30-pack-year history of smoking and currently smoke or have quit within the past 15 years. ?Fecal occult blood test (FOBT) of the stool. You may have this test every year starting at age 24. ?Flexible sigmoidoscopy or colonoscopy. You may have a sigmoidoscopy every 5 years or a colonoscopy every 10 years starting at age 69. ?Hepatitis C blood test. ?Hepatitis B blood test. ?Sexually transmitted disease (STD) testing. ?Diabetes screening. This is done by checking your blood sugar (glucose) after you have not eaten for a while (fasting). You may have this done every 1-3 years. ?Bone density scan. This is done to screen for osteoporosis. You may have this done starting at age 27. ?Mammogram. This may be done every 1-2 years. Talk to your health care provider about how often you should have regular mammograms. ?Talk with your health care provider about your test results, treatment options, and if necessary, the need for more tests. ?Vaccines  ?Your health care provider may recommend certain vaccines, such as: ?Influenza vaccine. This is recommended every year. ?Tetanus, diphtheria, and acellular pertussis (Tdap, Td) vaccine. You may need a Td booster every 10 years. ?Zoster vaccine. You may need this after age 71. ?Pneumococcal 13-valent conjugate (PCV13) vaccine. One dose is recommended after age 51. ?Pneumococcal polysaccharide (PPSV23) vaccine. One dose is recommended after age 75. ?Talk to your health care provider about which  screenings and vaccines you need and how often you need them. ?This information is  not intended to replace advice given to you by your health care provider. Make sure you discuss any questions you have with your health care provider. ?Document Released: 01/24/2015 Document Revised: 09/17/2015 Document Reviewed: 10/29/2014 ?Elsevier Interactive Patient Education ? 2017 [place]. ? ?Fall Prevention in the Home ?Falls can cause injuries. They can happen to people of all ages. There are many things you can do to make your home safe and to help prevent falls. ?What can I do on the outside of my home? ?Regularly fix the edges of walkways and driveways and fix any cracks. ?Remove anything that might make you trip as you walk through a door, such as a raised step or threshold. ?Trim any bushes or trees on the path to your home. ?Use bright outdoor lighting. ?Clear any walking paths of anything that might make someone trip, such as rocks or tools. ?Regularly check to see if handrails are loose or broken. Make sure that both sides of any steps have handrails. ?Any raised decks and porches should have guardrails on the edges. ?Have any leaves, snow, or ice cleared regularly. ?Use sand or salt on walking paths during winter. ?Clean up any spills in your garage right away. This includes oil or grease spills. ?What can I do in the bathroom? ?Use night lights. ?Install grab bars by the toilet and in the tub and shower. Do not use towel bars as grab bars. ?Use non-skid mats or decals in the tub or shower. ?If you need to sit down in the shower, use a plastic, non-slip stool. ?Keep the floor dry. Clean up any water that spills on the floor as soon as it happens. ?Remove soap buildup in the tub or shower regularly. ?Attach bath mats securely with double-sided non-slip rug tape. ?Do not have throw rugs and other things on the floor that can make you trip. ?What can I do in the bedroom? ?Use night lights. ?Make sure that you have a light by your bed that is easy to reach. ?Do not use any sheets or blankets that  are too big for your bed. They should not hang down onto the floor. ?Have a firm chair that has side arms. You can use this for support while you get dressed. ?Do not have throw rugs and other things on the floor that can make you trip. ?What can I do in the kitchen? ?Clean up any spills right away. ?Avoid walking on wet floors. ?Keep items that you use a lot in easy-to-reach places. ?If you need to reach something above you, use a strong step stool that has a grab bar. ?Keep electrical cords out of the way. ?Do not use floor polish or wax that makes floors slippery. If you must use wax, use non-skid floor wax. ?Do not have throw rugs and other things on the floor that can make you trip. ?What can I do with my stairs? ?Do not leave any items on the stairs. ?Make sure that there are handrails on both sides of the stairs and use them. Fix handrails that are broken or loose. Make sure that handrails are as long as the stairways. ?Check any carpeting to make sure that it is firmly attached to the stairs. Fix any carpet that is loose or worn. ?Avoid having throw rugs at the top or bottom of the stairs. If you do have throw rugs, attach them to the floor with carpet  tape. ?Make sure that you have a light switch at the top of the stairs and the bottom of the stairs. If you do not have them, ask someone to add them for you. ?What else can I do to help prevent falls? ?Wear shoes that: ?Do not have high heels. ?Have rubber bottoms. ?Are comfortable and fit you well. ?Are closed at the toe. Do not wear sandals. ?If you use a stepladder: ?Make sure that it is fully opened. Do not climb a closed stepladder. ?Make sure that both sides of the stepladder are locked into place. ?Ask someone to hold it for you, if possible. ?Clearly mark and make sure that you can see: ?Any grab bars or handrails. ?First and last steps. ?Where the edge of each step is. ?Use tools that help you move around (mobility aids) if they are needed. These  include: ?Canes. ?Walkers. ?Scooters. ?Crutches. ?Turn on the lights when you go into a dark area. Replace any light bulbs as soon as they burn out. ?Set up your furniture so you have a clear path. Avoid mov

## 2021-05-21 ENCOUNTER — Other Ambulatory Visit: Payer: Self-pay | Admitting: Internal Medicine

## 2021-05-21 ENCOUNTER — Other Ambulatory Visit: Payer: Self-pay | Admitting: Family Medicine

## 2021-05-21 DIAGNOSIS — Z1231 Encounter for screening mammogram for malignant neoplasm of breast: Secondary | ICD-10-CM

## 2021-06-19 DIAGNOSIS — M25562 Pain in left knee: Secondary | ICD-10-CM | POA: Diagnosis not present

## 2021-07-02 ENCOUNTER — Ambulatory Visit
Admission: RE | Admit: 2021-07-02 | Discharge: 2021-07-02 | Disposition: A | Discharge status: Home / Self Care | Payer: No Typology Code available for payment source | Source: Ambulatory Visit | Attending: Family Medicine | Admitting: Family Medicine

## 2021-07-02 DIAGNOSIS — Z1231 Encounter for screening mammogram for malignant neoplasm of breast: Secondary | ICD-10-CM

## 2021-07-06 ENCOUNTER — Other Ambulatory Visit: Payer: Self-pay | Admitting: Family Medicine

## 2021-07-06 DIAGNOSIS — R928 Other abnormal and inconclusive findings on diagnostic imaging of breast: Secondary | ICD-10-CM

## 2021-07-13 ENCOUNTER — Ambulatory Visit
Admission: RE | Admit: 2021-07-13 | Discharge: 2021-07-13 | Disposition: A | Discharge status: Home / Self Care | Payer: No Typology Code available for payment source | Source: Ambulatory Visit | Attending: Family Medicine | Admitting: Family Medicine

## 2021-07-13 DIAGNOSIS — R928 Other abnormal and inconclusive findings on diagnostic imaging of breast: Secondary | ICD-10-CM

## 2021-07-13 DIAGNOSIS — R922 Inconclusive mammogram: Secondary | ICD-10-CM | POA: Diagnosis not present

## 2021-07-28 ENCOUNTER — Other Ambulatory Visit: Payer: Self-pay | Admitting: Family Medicine

## 2021-07-28 MED ORDER — ZOLPIDEM TARTRATE 10 MG PO TABS: 5.0000 mg | ORAL_TABLET | Freq: Every evening | ORAL | 0 refills | Status: DC | PRN

## 2021-07-28 MED ORDER — ATENOLOL 25 MG PO TABS: 25.0000 mg | ORAL_TABLET | Freq: Two times a day (BID) | ORAL | 0 refills | Status: DC

## 2021-07-28 NOTE — Telephone Encounter (Signed)
Refill request for pending prescriptions new patient to [facility] as of 02/23/21 per patient she did not need any refills at the time of her first visit. Please advise.

## 2021-07-28 NOTE — Telephone Encounter (Signed)
Pt needs 3 meds refilled  atenolol and lorazepam to [facility] [place]. Zolpidem she wants filled at [place] [facility]. She is close to out of all 3.

## 2021-08-18 ENCOUNTER — Ambulatory Visit (INDEPENDENT_AMBULATORY_CARE_PROVIDER_SITE_OTHER): Payer: No Typology Code available for payment source | Admitting: Nurse Practitioner

## 2021-08-18 ENCOUNTER — Ambulatory Visit (INDEPENDENT_AMBULATORY_CARE_PROVIDER_SITE_OTHER): Payer: No Typology Code available for payment source

## 2021-08-18 ENCOUNTER — Encounter: Payer: Self-pay | Admitting: Nurse Practitioner

## 2021-08-18 ENCOUNTER — Telehealth: Payer: Self-pay

## 2021-08-18 VITALS — BP 124/72 | HR 54 | Temp 97.3°F | Ht 66.0 in | Wt 193.4 lb

## 2021-08-18 DIAGNOSIS — R739 Hyperglycemia, unspecified: Secondary | ICD-10-CM | POA: Diagnosis not present

## 2021-08-18 DIAGNOSIS — F418 Other specified anxiety disorders: Secondary | ICD-10-CM

## 2021-08-18 DIAGNOSIS — R079 Chest pain, unspecified: Secondary | ICD-10-CM | POA: Diagnosis not present

## 2021-08-18 DIAGNOSIS — R0789 Other chest pain: Secondary | ICD-10-CM

## 2021-08-18 DIAGNOSIS — R7989 Other specified abnormal findings of blood chemistry: Secondary | ICD-10-CM

## 2021-08-18 DIAGNOSIS — E039 Hypothyroidism, unspecified: Secondary | ICD-10-CM | POA: Diagnosis not present

## 2021-08-18 LAB — CBC
HCT: 41.9 % (ref 36.0–46.0)
Hemoglobin: 13.7 g/dL (ref 12.0–15.0)
MCHC: 32.6 g/dL (ref 30.0–36.0)
MCV: 92.7 fl (ref 78.0–100.0)
Platelets: 245 10*3/uL (ref 150.0–400.0)
RBC: 4.52 Mil/uL (ref 3.87–5.11)
RDW: 13.8 % (ref 11.5–15.5)
WBC: 6.3 10*3/uL (ref 4.0–10.5)

## 2021-08-18 LAB — TSH: TSH: 3.52 u[IU]/mL (ref 0.35–5.50)

## 2021-08-18 LAB — HEMOGLOBIN A1C: Hgb A1c MFr Bld: 6.3 % (ref 4.6–6.5)

## 2021-08-18 LAB — D-DIMER, QUANTITATIVE: D-DIMER: 0.6 mg/L FEU — ABNORMAL HIGH (ref 0.00–0.49)

## 2021-08-18 LAB — T4, FREE: Free T4: 1.75 ng/dL — ABNORMAL HIGH (ref 0.60–1.60)

## 2021-08-18 LAB — TROPONIN I (HIGH SENSITIVITY): High Sens Troponin I: 15 ng/L (ref 2–17)

## 2021-08-18 MED ORDER — NITROGLYCERIN 0.4 MG SL SUBL: 0.4000 mg | SUBLINGUAL_TABLET | SUBLINGUAL | 0 refills | Status: AC | PRN

## 2021-08-18 MED ORDER — ALPRAZOLAM 0.5 MG PO TABS: 0.2500 mg | ORAL_TABLET | Freq: Every day | ORAL | 0 refills | Status: DC | PRN

## 2021-08-18 NOTE — Progress Notes (Signed)
Established Patient Visit  Patient: Amy Tapia   DOB: 01/03/1934   88 y.o. Female  MRN: [id number] Visit Date: 08/24/2021  Subjective:    Chief Complaint  Patient presents with   Office Visit    Medication [place] C/o dull Chest Pain x 2 weeks , says it comes and goes  No other concerns     Chest Pain  This is a new problem. The current episode started 1 to 4 weeks ago. The onset quality is gradual. The problem occurs 2 to 4 times per day. The problem has been unchanged. The pain is present in the substernal region. The pain is mild. The quality of the pain is described as dull and tightness. The pain does not radiate. Pertinent negatives include no back pain, cough, diaphoresis, dizziness, exertional chest pressure, fever, headaches, irregular heartbeat, lower extremity edema, malaise/fatigue, nausea, near-syncope, numbness, orthopnea, palpitations, PND, shortness of breath, sputum production, syncope, vomiting or weakness. Risk factors include being elderly, lack of exercise, obesity, post-menopausal and sedentary lifestyle.  Her past medical history is significant for anxiety/panic attacks, hypertension and thyroid problem.  Pertinent negatives for past medical history include no CAD, no COPD, no CHF, no diabetes, no DVT, no PE and no sleep apnea.   No problem-specific Assessment & Plan notes found for this encounter.  BP Readings from Last 3 Encounters:  08/20/21 (!) 120/58  08/18/21 124/72  03/02/21 128/74    Hx of anxiety: previous use of lorazepam Last filled 2019 Reviewed medical, surgical, and social history today  Medications: Outpatient Medications Prior to Visit  Medication Sig   Ascorbic Acid (VITAMIN C) 100 MG tablet Take 100 mg by mouth daily.   aspirin 81 MG tablet Take 81 mg by mouth daily.   atenolol (TENORMIN) 50 MG tablet Take 50 mg by mouth 2 (two) times daily.   cholecalciferol (VITAMIN D) 1000 UNITS tablet Take 1,000 Units by mouth daily.    cyclobenzaprine (FLEXERIL) 10 MG tablet Take 10 mg by mouth daily as needed.   Fish Oil-Cholecalciferol (FISH OIL + D3 PO) Take 1,000 mg by mouth daily.   latanoprost (XALATAN) 0.005 % ophthalmic solution Place 1 drop into both eyes at bedtime.   lisinopril (ZESTRIL) 5 MG tablet Take 5 mg by mouth as needed. Only if bp is over 140   Misc Natural Products ([facility] GLUCOS-CHONDROIT-MSM DS) TABS Take 1 tablet by mouth daily.   vitamin E 100 UNIT capsule Take 100 Units by mouth daily.   zolpidem (AMBIEN) 5 MG tablet Take 5 mg by mouth at bedtime as needed.   [DISCONTINUED] LORazepam (ATIVAN) 0.5 MG tablet Take 0.5 mg by mouth every 8 (eight) hours.   [DISCONTINUED] atenolol (TENORMIN) 25 MG tablet Take 1 tablet (25 mg total) by mouth 2 (two) times daily. (Patient not taking: Reported on 08/18/2021)   [DISCONTINUED] loratadine (CLARITIN) 10 MG tablet Take 1 tablet (10 mg total) by mouth daily. (Patient not taking: Reported on 08/18/2021)   [DISCONTINUED] metroNIDAZOLE (FLAGYL) 500 MG tablet Take 500 mg by mouth 3 (three) times daily. (Patient not taking: Reported on 04/21/2021)   [DISCONTINUED] zolpidem (AMBIEN) 10 MG tablet Take 0.5 tablets (5 mg total) by mouth at bedtime as needed for sleep. (Patient not taking: Reported on 08/18/2021)   No facility-administered medications prior to visit.   Reviewed past medical and social history.   ROS per HPI above  Last CBC Lab Results  Component Value Date   WBC 6.3 08/18/2021   HGB 13.7 08/18/2021   HCT 41.9 08/18/2021   MCV 92.7 08/18/2021   MCH 31.7 10/30/2018   RDW 13.8 08/18/2021   PLT 245.0 78/93/8101   Last metabolic panel Lab Results  Component Value Date   GLUCOSE 115 (H) 03/02/2021   NA 140 03/02/2021   K 4.3 03/02/2021   CL 105 03/02/2021   CO2 32 03/02/2021   BUN 16 03/02/2021   CREATININE 0.83 03/02/2021   GFRNONAA >60 10/30/2018   CALCIUM 9.3 03/02/2021   PROT 6.9 03/02/2021   ALBUMIN 4.2 03/02/2021   BILITOT 0.5 03/02/2021    ALKPHOS 58 03/02/2021   AST 14 03/02/2021   ALT 15 03/02/2021   ANIONGAP 12 10/30/2018   Last thyroid functions Lab Results  Component Value Date   TSH 3.52 08/18/2021        Objective:  BP 124/72 (BP Location: Left Arm, Patient Position: Sitting, Cuff Size: Normal)   Pulse (!) 54   Temp (!) 97.3 F (36.3 C) (Temporal)   Ht '5\' 6"'$  (1.676 m)   Wt 193 lb 6.4 oz (87.7 kg)   SpO2 94%   BMI 31.22 kg/m   ECG: S. Bradycardia with 1st AVB, no change compared to 2020 ECG    Physical Exam Vitals reviewed.  Cardiovascular:     Rate and Rhythm: Normal rate and regular rhythm.     Pulses: Normal pulses.     Heart sounds: Normal heart sounds.  Pulmonary:     Effort: Pulmonary effort is normal. No respiratory distress.     Breath sounds: Normal breath sounds.  Musculoskeletal:     Right lower leg: No edema.     Left lower leg: No edema.  Neurological:     Mental Status: She is alert and oriented to person, place, and time.  Psychiatric:        Mood and Affect: Mood normal.        Behavior: Behavior normal.        Thought Content: Thought content normal.     Results for orders placed or performed in visit on 08/18/21  CBC  Result Value Ref Range   WBC 6.3 4.0 - 10.5 K/uL   RBC 4.52 3.87 - 5.11 Mil/uL   Platelets 245.0 150.0 - 400.0 K/uL   Hemoglobin 13.7 12.0 - 15.0 g/dL   HCT 41.9 36.0 - 46.0 %   MCV 92.7 78.0 - 100.0 fl   MCHC 32.6 30.0 - 36.0 g/dL   RDW 13.8 11.5 - 15.5 %  Hemoglobin A1c  Result Value Ref Range   Hgb A1c MFr Bld 6.3 4.6 - 6.5 %  TSH  Result Value Ref Range   TSH 3.52 0.35 - 5.50 uIU/mL  T4, free  Result Value Ref Range   Free T4 1.75 (H) 0.60 - 1.60 ng/dL  D-Dimer, Quantitative  Result Value Ref Range   D-DIMER 0.60 (H) 0.00 - 0.49 mg/L FEU  Troponin I (High Sensitivity)  Result Value Ref Range   High Sens Troponin I 15 2 - 17 ng/L      Assessment & Plan:    Problem List Items Addressed This Visit       Endocrine   Hypothyroid    Relevant Medications   atenolol (TENORMIN) 50 MG tablet   Other Relevant Orders   TSH (Completed)   T4, free (Completed)   Other Visit Diagnoses     Other chest pain    -  Primary   Relevant Medications   nitroGLYCERIN (NITROSTAT) 0.4 MG SL tablet   Other Relevant Orders   EKG 12-Lead (Completed)   CBC (Completed)   Troponin I (High Sensitivity) (Completed)   D-Dimer, Quantitative (Completed)   DG Chest 2 View (Completed)   CT Angio Chest Pulmonary Embolism (PE) W or WO Contrast (Completed)   Ambulatory referral to Cardiology   Hyperglycemia       Relevant Orders   Hemoglobin A1c (Completed)   Situational anxiety       Relevant Medications   ALPRAZolam (XANAX) 0.5 MG tablet   Positive D dimer       Relevant Orders   CT Angio Chest Pulmonary Embolism (PE) W or WO Contrast (Completed)      Return in about 3 months (around 11/18/2021) for Hypothyroidism, hyperlipidemia (fasting) with Dr. Ethelene Hal.     Wilfred Lacy, NP

## 2021-08-18 NOTE — Telephone Encounter (Signed)
Critical: [facility] Rep.   Critical Value: D-Dimer 0.60   Time Received: 2:10 pm

## 2021-08-18 NOTE — Telephone Encounter (Signed)
Per [place] error message on critical was meaning to send to [facility] [place] instead of Dr.  Ethelene Hal. Please advise message below

## 2021-08-18 NOTE — Progress Notes (Unsigned)
Initial visit for Chest pain Hx of malignant neoplasm of breast and Hpertension

## 2021-08-18 NOTE — Patient Instructions (Addendum)
Go to lab Go to ED if chest pain worsens Use nitroglycerin for chest pain as directed on bottle.  Nonspecific Chest Pain Chest pain can be caused by many different conditions. Some causes of chest pain can be life-threatening. These will require treatment right away. Serious causes of chest pain include: Heart attack. A tear in the body's main blood vessel. Redness and swelling (inflammation) around your heart. Blood clot in your lungs. Other causes of chest pain may not be so serious. These include: Heartburn. Anxiety or stress. Damage to bones or muscles in your chest. Lung infections. Chest pain can feel like: Pain or discomfort in your chest. Crushing, pressure, aching, or squeezing pain. Burning or tingling. Dull or sharp pain that is worse when you move, cough, or take a deep breath. Pain or discomfort that is also felt in your back, neck, jaw, shoulder, or arm, or pain that spreads to any of these areas. It is hard to know whether your pain is caused by something that is serious or something that is not so serious. So it is important to see your doctor right away if you have chest pain. Follow these instructions at home: Medicines Take over-the-counter and prescription medicines only as told by your doctor. If you were prescribed an antibiotic medicine, take it as told by your doctor. Do not stop taking the antibiotic even if you start to feel better. Lifestyle  Rest as told by your doctor. Do not use any products that contain nicotine or tobacco, such as cigarettes, e-cigarettes, and chewing tobacco. If you need help quitting, ask your doctor. Do not drink alcohol. Make lifestyle changes as told by your doctor. These may include: Getting regular exercise. Ask your doctor what activities are safe for you. Eating a heart-healthy diet. A diet and nutrition specialist (dietitian) can help you to learn healthy eating options. Staying at a healthy weight. Treating diabetes or  high blood pressure, if needed. Lowering your stress. Activities such as yoga and relaxation techniques can help. General instructions Pay attention to any changes in your symptoms. Tell your doctor about them or any new symptoms. Avoid any activities that cause chest pain. Keep all follow-up visits as told by your doctor. This is important. You may need more testing if your chest pain does not go away. Contact a doctor if: Your chest pain does not go away. You feel depressed. You have a fever. Get help right away if: Your chest pain is worse. You have a cough that gets worse, or you cough up blood. You have very bad (severe) pain in your belly (abdomen). You pass out (faint). You have either of these for no clear reason: Sudden chest discomfort. Sudden discomfort in your arms, back, neck, or jaw. You have shortness of breath at any time. You suddenly start to sweat, or your skin gets clammy. You feel sick to your stomach (nauseous). You throw up (vomit). You suddenly feel lightheaded or dizzy. You feel very weak or tired. Your heart starts to beat fast, or it feels like it is skipping beats. These symptoms may be an emergency. Do not wait to see if the symptoms will go away. Get medical help right away. Call your local emergency services (911 in the [place] Do not drive yourself to the hospital. Summary Chest pain can be caused by many different conditions. The cause may be serious and need treatment right away. If you have chest pain, see your doctor right away. Follow your doctor's instructions for taking  medicines and making lifestyle changes. Keep all follow-up visits as told by your doctor. This includes visits for any further testing if your chest pain does not go away. Be sure to know the signs that show that your condition has become worse. Get help right away if you have these symptoms. This information is not intended to replace advice given to you by your health care  provider. Make sure you discuss any questions you have with your health care provider. Document Revised: 03/13/2020 Document Reviewed: 03/13/2020 Elsevier Patient Education  [place].

## 2021-08-18 NOTE — Telephone Encounter (Signed)
Pt advised.

## 2021-08-19 ENCOUNTER — Ambulatory Visit
Admission: RE | Admit: 2021-08-19 | Discharge: 2021-08-19 | Disposition: A | Discharge status: Home / Self Care | Payer: No Typology Code available for payment source | Source: Ambulatory Visit | Attending: Nurse Practitioner | Admitting: Nurse Practitioner

## 2021-08-19 DIAGNOSIS — R0789 Other chest pain: Secondary | ICD-10-CM

## 2021-08-19 DIAGNOSIS — R7989 Other specified abnormal findings of blood chemistry: Secondary | ICD-10-CM

## 2021-08-19 DIAGNOSIS — I281 Aneurysm of pulmonary artery: Secondary | ICD-10-CM | POA: Diagnosis not present

## 2021-08-19 MED ORDER — IOPAMIDOL (ISOVUE-370) INJECTION 76%
75.0000 mL | Freq: Once | INTRAVENOUS | Status: AC | PRN
  Administered 2021-08-19: 75 mL via INTRAVENOUS

## 2021-08-20 ENCOUNTER — Encounter: Payer: Self-pay | Admitting: Internal Medicine

## 2021-08-20 ENCOUNTER — Ambulatory Visit (INDEPENDENT_AMBULATORY_CARE_PROVIDER_SITE_OTHER): Payer: No Typology Code available for payment source | Admitting: Internal Medicine

## 2021-08-20 VITALS — BP 120/58 | HR 51 | Ht 67.0 in | Wt 192.0 lb

## 2021-08-20 DIAGNOSIS — I700 Atherosclerosis of aorta: Secondary | ICD-10-CM | POA: Insufficient documentation

## 2021-08-20 DIAGNOSIS — I10 Essential (primary) hypertension: Secondary | ICD-10-CM | POA: Diagnosis not present

## 2021-08-20 DIAGNOSIS — R072 Precordial pain: Secondary | ICD-10-CM | POA: Diagnosis not present

## 2021-08-20 DIAGNOSIS — E782 Mixed hyperlipidemia: Secondary | ICD-10-CM

## 2021-08-20 NOTE — Patient Instructions (Signed)
Medication Instructions:  Your physician recommends that you continue on your current medications as directed. Please refer to the Current Medication list given to you today.  *If you need a refill on your cardiac medications before your next appointment, please call your pharmacy*   Lab Work: NONE If you have labs (blood work) drawn today and your tests are completely normal, you will receive your results only by: [place] (if you have MyChart) OR A paper copy in the mail If you have any lab test that is abnormal or we need to change your treatment, we will call you to review the results.   Testing/Procedures: NONE    Follow-Up: At [facility], you and your health needs are our priority.  As part of our continuing mission to provide you with exceptional heart care, we have created designated Provider Care Teams.  These Care Teams include your primary Cardiologist (physician) and Advanced Practice Providers (APPs -  Physician Assistants and Nurse Practitioners) who all work together to provide you with the care you need, when you need it.  We recommend signing up for the patient portal called "MyChart".  Sign up information is provided on this After Visit Summary.  MyChart is used to connect with patients for Virtual Visits (Telemedicine).  Patients are able to view lab/test results, encounter notes, upcoming appointments, etc.  Non-urgent messages can be sent to your provider as well.   To learn more about what you can do with MyChart, go to [email or web address].    Your next appointment:   6 month(s)  The format for your next appointment:   In Person  Provider:   Werner Lean, MD    Important Information About Sugar

## 2021-08-20 NOTE — Progress Notes (Signed)
Cardiology Office Note:    Date:  08/20/2021   ID:  Amy Tapia, Amy Tapia [birth year], MRN [id number]  PCP:  Libby Maw, MD   [place] Providers Cardiologist:  Werner Lean, MD     Referring MD: Libby Maw,*   CC: Chest pain Consulted for the evaluation of chest pain at the behest of Dr. Ethelene Hal  History of Present Illness:    Amy Tapia is a 88 y.o. female with a hx of Aortic atherosclerosis, HLD, and HTN, prior history of breast cancer (distant) radical mastectomy only .  Saw Dr. Ron Parker in 2015.  Patient notes that she is feeling ok today.   About a week ago, she was having a dull pain the top of the chest, over her breast.  Stayed there.  No radiation.  Chest pain occurred while reading.  Was a constant chest pain that was worse at [facility].  At church had right arm clamminess and right arm pain.  Improved when seeing PC NP.  Improved during D Dimer, and then her negative CTPE  Today has had no chest pain, chest pressure, chest tightness, chest stinging .    Patient exertion notable for doing housework  and feels no symptoms.    No shortness of breath, DOE .  No PND or orthopnea.  No weight gain, leg swelling , or abdominal swelling.  No syncope or near syncope  Notes  no palpitations or funny heart beats.     Patient reports prior cardiac testing including CTPE after elveated D Dimer, negative for PE, non CAC, aortic atherosclerosis.  Echo in 2016 was grossly normal per report.  Ambulatory BP 122/68.  Has not needed PRN nitroglycerin    Past Medical History:  Diagnosis Date   Arthritis    "maybe a little bit in my knees" (05/02/2014)   Breast cancer, right breast ([place])    Glaucoma of both eyes    HLD (hyperlipidemia)    Hypertension     Past Surgical History:  Procedure Laterality Date   ABDOMINAL HYSTERECTOMY  09/1971   BREAST BIOPSY Right 08/1970   CATARACT EXTRACTION W/ INTRAOCULAR LENS IMPLANT Right 04/2013   MASTECTOMY  Right 08/1970   TONSILLECTOMY     TYMPANOPLASTY Bilateral ~ 3716   "had eardrums patched w/vein from my arms"    Current Medications: Current Meds  Medication Sig   ALPRAZolam (XANAX) 0.5 MG tablet Take 0.5-1 tablets (0.25-0.5 mg total) by mouth daily as needed for anxiety.   Ascorbic Acid (VITAMIN C) 100 MG tablet Take 100 mg by mouth daily.   aspirin 81 MG tablet Take 81 mg by mouth daily.   atenolol (TENORMIN) 50 MG tablet Take 50 mg by mouth 2 (two) times daily.   cholecalciferol (VITAMIN D) 1000 UNITS tablet Take 1,000 Units by mouth daily.   cyclobenzaprine (FLEXERIL) 10 MG tablet Take 10 mg by mouth daily as needed.   Fish Oil-Cholecalciferol (FISH OIL + D3 PO) Take 1,000 mg by mouth daily.   latanoprost (XALATAN) 0.005 % ophthalmic solution Place 1 drop into both eyes at bedtime.   lisinopril (ZESTRIL) 5 MG tablet Take 5 mg by mouth as needed. Only if bp is over 140   Misc Natural Products ([employer] GLUCOS-CHONDROIT-MSM DS) TABS Take 1 tablet by mouth daily.   nitroGLYCERIN (NITROSTAT) 0.4 MG SL tablet Place 1 tablet (0.4 mg total) under the tongue every 5 (five) minutes as needed for chest pain. No more than 3tabs in 24hrs. Call  911 if pain does not improve   vitamin E 100 UNIT capsule Take 100 Units by mouth daily.   zolpidem (AMBIEN) 5 MG tablet Take 5 mg by mouth at bedtime as needed.     Allergies:   Penicillins, Sular [nisoldipine], and Amlodipine besylate   Social History   Socioeconomic History   Marital status: Married    Spouse name: Not on file   Number of children: Not on file   Years of education: Not on file   Highest education level: Not on file  Occupational History   Not on file  Tobacco Use   Smoking status: Never   Smokeless tobacco: Never  Vaping Use   Vaping Use: Never used  Substance and Sexual Activity   Alcohol use: No   Drug use: No   Sexual activity: Not Currently  Other Topics Concern   Not on file  Social History Narrative   Not on file    Social Determinants of Health   Financial Resource Strain: Low Risk  (04/21/2021)   Overall Financial Resource Strain (CARDIA)    Difficulty of Paying Living Expenses: Not hard at all  Food Insecurity: No Food Insecurity (04/21/2021)   Hunger Vital Sign    Worried About Running Out of Food in the Last Year: Never true    [place] in the Last Year: Never true  Transportation Needs: No Transportation Needs (04/21/2021)   PRAPARE - Hydrologist (Medical): No    Lack of Transportation (Non-Medical): No  Physical Activity: Inactive (04/21/2021)   Exercise Vital Sign    Days of Exercise per Week: 0 days    Minutes of Exercise per Session: 0 min  Stress: No Stress Concern Present (04/21/2021)   [place]    Feeling of Stress : Not at all  Social Connections: Moderately Integrated (04/21/2021)   Social Connection and Isolation Panel [NHANES]    Frequency of Communication with Friends and Family: Twice a week    Frequency of Social Gatherings with Friends and Family: Twice a week    Attends Religious Services: More than 4 times per year    Active Member of [employer] or Organizations: No    Attends Music therapist: Never    Marital Status: Married     Family History: The patient's family history includes Arthritis in her sister; Breast cancer in her maternal aunt; Glaucoma in her mother; Heart disease in her brother; Hyperthyroidism in her mother; Rheumatic fever in her sister.  ROS:   Please see the history of present illness.     All other systems reviewed and are negative.  EKGs/Labs/Other Studies Reviewed:    The following studies were reviewed today:  EKG:  EKG is  ordered today.  The ekg ordered today demonstrates  08/18/21: Sinus bradycardia with 1st HB  Recent Labs: 03/02/2021: ALT 15; BUN 16; Creatinine, Ser 0.83; Potassium 4.3; Sodium 140 08/18/2021: Hemoglobin 13.7;  Platelets 245.0; TSH 3.52  Recent Lipid Panel    Component Value Date/Time   CHOL 208 (H) 03/02/2021 1209   TRIG 131.0 03/02/2021 1209   HDL 65.80 03/02/2021 1209   CHOLHDL 3 03/02/2021 1209   VLDL 26.2 03/02/2021 1209   LDLCALC 116 (H) 03/02/2021 1209        Physical Exam:    VS:  BP (!) 120/58   Pulse (!) 51   Ht '5\' 7"'$  (1.702 m)   Wt 192  lb (87.1 kg)   SpO2 96%   BMI 30.07 kg/m     Wt Readings from Last 3 Encounters:  08/20/21 192 lb (87.1 kg)  08/18/21 193 lb 6.4 oz (87.7 kg)  03/02/21 191 lb 3.2 oz (86.7 kg)     GEN:  Well nourished, well developed in no acute distress HEENT: Normal NECK: No JVD; No carotid bruits LYMPHATICS: No lymphadenopathy CARDIAC: RRR, no murmurs, rubs, gallops RESPIRATORY:  Clear to auscultation without rales, wheezing or rhonchi  ABDOMEN: Soft, non-tender, non-distended MUSCULOSKELETAL:  No edema; No deformity  SKIN: Warm and dry NEUROLOGIC:  Alert and oriented x 3 PSYCHIATRIC:  Normal affect   ASSESSMENT:    1. Precordial chest pain   2. Aortic atherosclerosis ([place])   3. Essential hypertension   4. Mixed hyperlipidemia    PLAN:    Chest Pain (resolved); prior breast cancer s/p bilateral mastectomy with no recurrent and no chemo-radiation HLD complicated by statin myalgia Aortic atherosclerosis HTN well controlled, asymptomatic of bradycardia on atenolol) - ASA 81 mg PO daily reasonable for now - has need no PRN nitro - given her statin myalgia, we have requested to have her upcoming lipids sent to [place]; will likely start zetia 10 mg; reviewed CTPE with patient - offered exercise NM Stress test; after shared decision making with patient if her CP returns she will refer for stress test; I will see her in 6 months    Medication Adjustments/Labs and Tests Ordered: Current medicines are reviewed at length with the patient today.  Concerns regarding medicines are outlined above.  No orders of the defined types were placed in this  encounter.  No orders of the defined types were placed in this encounter.   Patient Instructions  Medication Instructions:  Your physician recommends that you continue on your current medications as directed. Please refer to the Current Medication list given to you today.  *If you need a refill on your cardiac medications before your next appointment, please call your pharmacy*   Lab Work: NONE If you have labs (blood work) drawn today and your tests are completely normal, you will receive your results only by: [place] (if you have MyChart) OR A paper copy in the mail If you have any lab test that is abnormal or we need to change your treatment, we will call you to review the results.   Testing/Procedures: NONE    Follow-Up: At [facility], you and your health needs are our priority.  As part of our continuing mission to provide you with exceptional heart care, we have created designated Provider Care Teams.  These Care Teams include your primary Cardiologist (physician) and Advanced Practice Providers (APPs -  Physician Assistants and Nurse Practitioners) who all work together to provide you with the care you need, when you need it.  We recommend signing up for the patient portal called "MyChart".  Sign up information is provided on this After Visit Summary.  MyChart is used to connect with patients for Virtual Visits (Telemedicine).  Patients are able to view lab/test results, encounter notes, upcoming appointments, etc.  Non-urgent messages can be sent to your provider as well.   To learn more about what you can do with MyChart, go to [email or web address].    Your next appointment:   6 month(s)  The format for your next appointment:   In Person  Provider:   Werner Lean, MD    Important Information About Sugar  Signed, Werner Lean, MD  08/20/2021 11:36 AM    [place]

## 2021-09-03 DIAGNOSIS — C50911 Malignant neoplasm of unspecified site of right female breast: Secondary | ICD-10-CM | POA: Diagnosis not present

## 2021-09-22 ENCOUNTER — Other Ambulatory Visit: Payer: Self-pay | Admitting: Family Medicine

## 2021-10-19 ENCOUNTER — Telehealth: Payer: Self-pay | Admitting: Family Medicine

## 2021-10-19 NOTE — Telephone Encounter (Signed)
Caller Name: caralyn Call back phone #: [phone]   MEDICATION(S):  Zolpidem, atenolol  Days of Med Remaining:   Has the patient contacted their pharmacy (YES/NO)? y What did pharmacy advise?   Preferred Pharmacy:  [place], [place] - [address] Phone:  [phone]      ~~~Please advise patient/caregiver to allow 2-3 business days to process RX refills.    Pt want 90 day supply for the atenolol medication  anmd pt said the Lorrin Mais is suppose to be '10mg'$ ... please give the pt a call

## 2021-10-20 NOTE — Telephone Encounter (Signed)
Patient calling for Ambien and Atenolol to be refilled TOC

## 2021-10-20 NOTE — Telephone Encounter (Signed)
Hve tried calling patient to inform that requested prescriptions were denied due to patient not being seen by Dr. Ethelene Hal, all contact numbers none working. Will try calling back

## 2021-10-23 ENCOUNTER — Other Ambulatory Visit: Payer: Self-pay | Admitting: Family Medicine

## 2021-10-26 ENCOUNTER — Other Ambulatory Visit: Payer: Self-pay | Admitting: Family Medicine

## 2021-10-26 NOTE — Telephone Encounter (Signed)
Patient calling for refill on pending Rx message sent to Dr. Ethelene Hal for refill assuming due to upcoming appointment she was scheduled for. Dr. Ethelene Hal have never seen patient she states that she is not sure why she is scheduled with Ethelene Hal. Please advise on refill. Checking with front on changing patient back to [facility] to PCP and schedule her appointment

## 2021-10-26 NOTE — Telephone Encounter (Signed)
Pt called and said the pharmacy will not fill her medication

## 2021-10-27 ENCOUNTER — Other Ambulatory Visit: Payer: Self-pay | Admitting: Nurse Practitioner

## 2021-10-27 MED ORDER — ZOLPIDEM TARTRATE 5 MG PO TABS: 5.0000 mg | ORAL_TABLET | Freq: Every evening | ORAL | 0 refills | Status: DC | PRN

## 2021-10-27 MED ORDER — ATENOLOL 50 MG PO TABS: 50.0000 mg | ORAL_TABLET | Freq: Two times a day (BID) | ORAL | 1 refills | Status: DC

## 2021-11-04 NOTE — Telephone Encounter (Signed)
Appointment changed to [place] on 10/19/21.Dm/cma

## 2021-11-18 ENCOUNTER — Ambulatory Visit: Payer: No Typology Code available for payment source | Admitting: Family Medicine

## 2021-11-18 ENCOUNTER — Ambulatory Visit: Payer: No Typology Code available for payment source | Admitting: Nurse Practitioner

## 2021-11-19 ENCOUNTER — Telehealth: Payer: Self-pay | Admitting: Nurse Practitioner

## 2021-11-19 ENCOUNTER — Ambulatory Visit: Payer: No Typology Code available for payment source | Admitting: Nurse Practitioner

## 2021-11-19 NOTE — Telephone Encounter (Signed)
1st no show, fee waived, letter sent 

## 2021-11-19 NOTE — Telephone Encounter (Signed)
Pt was a no show for an OV with [place] on 11/19/21, I sent a letter.

## 2021-11-30 ENCOUNTER — Other Ambulatory Visit: Payer: Self-pay | Admitting: Nurse Practitioner

## 2021-12-16 ENCOUNTER — Ambulatory Visit (INDEPENDENT_AMBULATORY_CARE_PROVIDER_SITE_OTHER): Payer: No Typology Code available for payment source | Admitting: Nurse Practitioner

## 2021-12-16 ENCOUNTER — Encounter: Payer: Self-pay | Admitting: Nurse Practitioner

## 2021-12-16 VITALS — BP 150/70 | HR 54 | Temp 96.9°F | Ht 67.0 in | Wt 198.0 lb

## 2021-12-16 DIAGNOSIS — R7303 Prediabetes: Secondary | ICD-10-CM

## 2021-12-16 DIAGNOSIS — E1165 Type 2 diabetes mellitus with hyperglycemia: Secondary | ICD-10-CM | POA: Diagnosis not present

## 2021-12-16 DIAGNOSIS — Z780 Asymptomatic menopausal state: Secondary | ICD-10-CM

## 2021-12-16 DIAGNOSIS — I10 Essential (primary) hypertension: Secondary | ICD-10-CM

## 2021-12-16 DIAGNOSIS — E782 Mixed hyperlipidemia: Secondary | ICD-10-CM

## 2021-12-16 DIAGNOSIS — E1169 Type 2 diabetes mellitus with other specified complication: Secondary | ICD-10-CM

## 2021-12-16 DIAGNOSIS — Z23 Encounter for immunization: Secondary | ICD-10-CM | POA: Diagnosis not present

## 2021-12-16 DIAGNOSIS — E785 Hyperlipidemia, unspecified: Secondary | ICD-10-CM

## 2021-12-16 DIAGNOSIS — R102 Pelvic and perineal pain: Secondary | ICD-10-CM

## 2021-12-16 DIAGNOSIS — E039 Hypothyroidism, unspecified: Secondary | ICD-10-CM

## 2021-12-16 DIAGNOSIS — E119 Type 2 diabetes mellitus without complications: Secondary | ICD-10-CM | POA: Insufficient documentation

## 2021-12-16 LAB — RENAL FUNCTION PANEL
Albumin: 3.9 g/dL (ref 3.5–5.2)
BUN: 19 mg/dL (ref 6–23)
CO2: 30 mEq/L (ref 19–32)
Calcium: 9 mg/dL (ref 8.4–10.5)
Chloride: 104 mEq/L (ref 96–112)
Creatinine, Ser: 0.75 mg/dL (ref 0.40–1.20)
GFR: 72.3 mL/min (ref >60.00)
Glucose, Bld: 94 mg/dL (ref 70–99)
Phosphorus: 3.8 mg/dL (ref 2.3–4.6)
Potassium: 4.7 mEq/L (ref 3.5–5.1)
Sodium: 139 mEq/L (ref 135–145)

## 2021-12-16 LAB — HEMOGLOBIN A1C: Hgb A1c MFr Bld: 6.6 % — ABNORMAL HIGH (ref 4.6–6.5)

## 2021-12-16 LAB — LDL CHOLESTEROL, DIRECT: Direct LDL: 110 mg/dL

## 2021-12-16 LAB — T4, FREE: Free T4: 1.64 ng/dL — ABNORMAL HIGH (ref 0.60–1.60)

## 2021-12-16 LAB — TSH: TSH: 3.08 u[IU]/mL (ref 0.35–5.50)

## 2021-12-16 NOTE — Assessment & Plan Note (Addendum)
Repeat direct LDL due to non fasting today. With hx of aortic artherosclerosis, need to starts statin if elevated LDL>70

## 2021-12-16 NOTE — Assessment & Plan Note (Signed)
Elevated today. BP Readings from Last 3 Encounters:  12/16/21 (!) 150/70  08/20/21 (!) 120/58  08/18/21 124/72    Advised to monitor BP at home and start lisinopril is BP persistently >140/80. Repeat BMP

## 2021-12-16 NOTE — Assessment & Plan Note (Signed)
Repeat hgbA1c 

## 2021-12-16 NOTE — Patient Instructions (Signed)
Go to lab You will be contacted to schedule appt for bone density Monitor BP at home Call office if BP >140/80

## 2021-12-16 NOTE — Progress Notes (Signed)
Established Patient Visit  Patient: Amy Tapia   DOB: July 24, 1933   88 y.o. Female  MRN: [id number] Visit Date: 12/16/2021  Subjective:    Chief Complaint  Patient presents with   Office Visit    Hypothyroid/ Hyperlipidemia  Pt not fasting  Flu shot given today    HPI Essential hypertension Elevated today. BP Readings from Last 3 Encounters:  12/16/21 (!) 150/70  08/20/21 (!) 120/58  08/18/21 124/72    Advised to monitor BP at home and start lisinopril is BP persistently >140/80. Repeat BMP  Mixed hyperlipidemia Repeat direct LDL due to non fasting today. With hx of aortic artherosclerosis, need to starts statin if elevated LDL>70  Hypothyroid Repeat TSH and T4 Maintain levothyroxine dose   Prediabetes Repeat hgbA1c  She reports Suprapubic pain last week, associated with constipation, took metronidazole '500mg'$  x 5days, pain and constipation now resolved. Agreed to urinalysis to ensure no UTI  Wt Readings from Last 3 Encounters:  12/16/21 198 lb (89.8 kg)  08/20/21 192 lb (87.1 kg)  08/18/21 193 lb 6.4 oz (87.7 kg)     Reviewed medical, surgical, and social history today  Medications: Outpatient Medications Prior to Visit  Medication Sig   ALPRAZolam (XANAX) 0.5 MG tablet Take 0.5-1 tablets (0.25-0.5 mg total) by mouth daily as needed for anxiety.   Ascorbic Acid (VITAMIN C) 100 MG tablet Take 100 mg by mouth daily.   aspirin 81 MG tablet Take 81 mg by mouth daily.   atenolol (TENORMIN) 50 MG tablet Take 1 tablet (50 mg total) by mouth 2 (two) times daily.   cholecalciferol (VITAMIN D) 1000 UNITS tablet Take 1,000 Units by mouth daily.   cyclobenzaprine (FLEXERIL) 10 MG tablet Take 10 mg by mouth daily as needed.   Fish Oil-Cholecalciferol (FISH OIL + D3 PO) Take 1,000 mg by mouth daily.   latanoprost (XALATAN) 0.005 % ophthalmic solution Place 1 drop into both eyes at bedtime.   lisinopril (ZESTRIL) 5 MG tablet Take 5 mg by mouth as needed. Only  if bp is over 140   metroNIDAZOLE (FLAGYL) 500 MG tablet Take 500 mg by mouth 3 (three) times daily. As needed for diverticulitis symptoms   Misc Natural Products ([employer]S GLUCOS-CHONDROIT-MSM DS) TABS Take 1 tablet by mouth daily.   vitamin E 100 UNIT capsule Take 100 Units by mouth daily.   zolpidem (AMBIEN) 5 MG tablet TAKE 1 TABLET BY MOUTH AT BEDTIME AS NEEDED   nitroGLYCERIN (NITROSTAT) 0.4 MG SL tablet Place 1 tablet (0.4 mg total) under the tongue every 5 (five) minutes as needed for chest pain. No more than 3tabs in 24hrs. Call 911 if pain does not improve (Patient not taking: Reported on 12/16/2021)   No facility-administered medications prior to visit.   Reviewed past medical and social history.   ROS per HPI above      Objective:  BP (!) 150/70   Pulse (!) 54   Temp (!) 96.9 F (36.1 C) (Temporal)   Ht '5\' 7"'$  (1.702 m)   Wt 198 lb (89.8 kg)   SpO2 98%   BMI 31.01 kg/m      Physical Exam Cardiovascular:     Rate and Rhythm: Normal rate and regular rhythm.     Pulses: Normal pulses.     Heart sounds: Normal heart sounds.  Pulmonary:     Effort: Pulmonary effort is normal.     Breath sounds:  Normal breath sounds.  Abdominal:     General: There is no distension.     Palpations: Abdomen is soft.     Tenderness: There is no abdominal tenderness.  Musculoskeletal:     Right lower leg: No edema.     Left lower leg: No edema.  Neurological:     Mental Status: She is alert and oriented to person, place, and time.  Psychiatric:        Mood and Affect: Mood normal.        Behavior: Behavior normal.        Thought Content: Thought content normal.     No results found for any visits on 12/16/21.    Assessment & Plan:    Problem List Items Addressed This Visit       Cardiovascular and Mediastinum   Essential hypertension    Elevated today. BP Readings from Last 3 Encounters:  12/16/21 (!) 150/70  08/20/21 (!) 120/58  08/18/21 124/72    Advised to monitor BP at  home and start lisinopril is BP persistently >140/80. Repeat BMP      Relevant Orders   Renal Function Panel     Endocrine   Hypothyroid    Repeat TSH and T4 Maintain levothyroxine dose       Relevant Orders   TSH   T4, free     Other   Mixed hyperlipidemia    Repeat direct LDL due to non fasting today. With hx of aortic artherosclerosis, need to starts statin if elevated LDL>70      Relevant Orders   Direct LDL   Prediabetes    Repeat hgbA1c      Relevant Orders   Hemoglobin A1c   Other Visit Diagnoses     Suprapubic abdominal pain    -  Primary   Relevant Orders   Urinalysis w microscopic + reflex cultur   Asymptomatic age-related postmenopausal state       Relevant Orders   DG Bone Density      Return in about 3 months (around 03/17/2022) for HTN, Hypothyroidism, hyperlipidemia (fasting).     Wilfred Lacy, NP

## 2021-12-16 NOTE — Assessment & Plan Note (Signed)
Repeat TSH and T4 Maintain levothyroxine dose

## 2021-12-17 LAB — URINALYSIS W MICROSCOPIC + REFLEX CULTURE
Bacteria, UA: NONE SEEN /HPF
Bilirubin Urine: NEGATIVE
Glucose, UA: NEGATIVE
Hgb urine dipstick: NEGATIVE
Hyaline Cast: NONE SEEN /LPF
Ketones, ur: NEGATIVE
Leukocyte Esterase: NEGATIVE
Nitrites, Initial: NEGATIVE
Protein, ur: NEGATIVE
RBC / HPF: NONE SEEN /HPF (ref 0–2)
Specific Gravity, Urine: 1.013 (ref 1.001–1.035)
Squamous Epithelial / HPF: NONE SEEN /HPF (ref <=5)
WBC, UA: NONE SEEN /HPF (ref 0–5)
pH: 5.5 (ref 5.0–8.0)

## 2021-12-17 LAB — NO CULTURE INDICATED

## 2021-12-17 NOTE — Addendum Note (Signed)
Addended by: Wilfred Lacy L on: 12/17/2021 04:14 PM   Modules accepted: Orders

## 2021-12-21 ENCOUNTER — Telehealth: Payer: Self-pay | Admitting: Nurse Practitioner

## 2021-12-21 DIAGNOSIS — E1169 Type 2 diabetes mellitus with other specified complication: Secondary | ICD-10-CM

## 2021-12-21 DIAGNOSIS — I700 Atherosclerosis of aorta: Secondary | ICD-10-CM

## 2021-12-21 DIAGNOSIS — E039 Hypothyroidism, unspecified: Secondary | ICD-10-CM

## 2021-12-21 MED ORDER — LEVOTHYROXINE SODIUM 25 MCG PO TABS: 25.0000 ug | ORAL_TABLET | Freq: Every day | ORAL | 1 refills | Status: DC

## 2021-12-21 NOTE — Telephone Encounter (Signed)
-----   Message from [place] sent at 12/21/2021 11:48 AM EST ----- Pt advised & voiced understanding.  Pt is not taking levothyroxine and she is not okay with starting crestor. ----- Message ----- From: Flossie Buffy, NP Sent: 12/17/2021   4:14 PM EST To: Charolette Child Wall, CMA  hgbA1c at 6.6%: type 2 diabetes. Need to maintain low sugar/#low carb diet. Elevated T4, but normal TSH: are you taking levothyroxine? Elevated LDL: with hx of plaque buildup in your aorta and Diabetes diagnosis; I recommend you start a cholesterol medication: crestor '5mg'$  daily. Are you ok with that? Normal renal function and urinalysis Need to return to lab in 29month (fasting) for repeat hgbA1c.

## 2021-12-21 NOTE — Assessment & Plan Note (Signed)
She declined use of statin

## 2021-12-21 NOTE — Assessment & Plan Note (Signed)
Declined use of crestor

## 2021-12-25 DIAGNOSIS — H401131 Primary open-angle glaucoma, bilateral, mild stage: Secondary | ICD-10-CM | POA: Diagnosis not present

## 2021-12-29 ENCOUNTER — Other Ambulatory Visit: Payer: Self-pay | Admitting: Nurse Practitioner

## 2022-01-27 ENCOUNTER — Other Ambulatory Visit: Payer: Self-pay | Admitting: Nurse Practitioner

## 2022-01-27 NOTE — Telephone Encounter (Signed)
Chart supports Rx Last OV: 12/2021 Next OV: 01/2022

## 2022-02-01 ENCOUNTER — Ambulatory Visit (INDEPENDENT_AMBULATORY_CARE_PROVIDER_SITE_OTHER): Payer: No Typology Code available for payment source | Admitting: Nurse Practitioner

## 2022-02-01 ENCOUNTER — Encounter: Payer: Self-pay | Admitting: Nurse Practitioner

## 2022-02-01 VITALS — BP 120/80 | HR 65 | Temp 96.6°F | Ht 67.0 in | Wt 192.8 lb

## 2022-02-01 DIAGNOSIS — E039 Hypothyroidism, unspecified: Secondary | ICD-10-CM

## 2022-02-01 DIAGNOSIS — E1169 Type 2 diabetes mellitus with other specified complication: Secondary | ICD-10-CM

## 2022-02-01 DIAGNOSIS — I700 Atherosclerosis of aorta: Secondary | ICD-10-CM | POA: Diagnosis not present

## 2022-02-01 DIAGNOSIS — I10 Essential (primary) hypertension: Secondary | ICD-10-CM

## 2022-02-01 DIAGNOSIS — E785 Hyperlipidemia, unspecified: Secondary | ICD-10-CM | POA: Diagnosis not present

## 2022-02-01 DIAGNOSIS — H903 Sensorineural hearing loss, bilateral: Secondary | ICD-10-CM

## 2022-02-01 MED ORDER — ROSUVASTATIN CALCIUM 5 MG PO TABS: 5.0000 mg | ORAL_TABLET | ORAL | 5 refills | Status: DC

## 2022-02-01 NOTE — Assessment & Plan Note (Signed)
ASCVD risk at 47% Hx of aorta artherosclerosis, HTN and DM Reports previous muscle cramps with statin drug but unable to remember name of med. Advised about possible benefits vs cons. Recommended low dose crestor 3x/week to help build med tolerance. She agreed. Repeat lipid panel in 69month.

## 2022-02-01 NOTE — Assessment & Plan Note (Addendum)
Reporst she never took of levothyroxine in past Normal Tsh and mildly elevated T4  Continue to hold levothyroxine Repeat thyroid panel in 58month

## 2022-02-01 NOTE — Patient Instructions (Addendum)
Start crestor '5mg'$  3x/week Maintain upcoming appt

## 2022-02-01 NOTE — Assessment & Plan Note (Addendum)
Home BP 123/60 Lisinopril used prn at this time BP Readings from Last 3 Encounters:  02/01/22 120/80  12/16/21 (!) 150/70  08/20/21 (!) 120/58

## 2022-02-01 NOTE — Progress Notes (Signed)
Established Patient Visit  Patient: Amy Tapia   DOB: October 17, 1933   89 y.o. Female  MRN: [id number] Visit Date: 02/01/2022  Subjective:    Chief Complaint  Patient presents with   Office Visit    Lab result discussion  Thyroid mediation  Requesting records for eye exam    HPI Hypothyroid Reporst she never took of levothyroxine in past Normal Tsh and mildly elevated T4  Continue to hold levothyroxine Repeat thyroid panel in 31month  Essential hypertension Home BP 123/60 Lisinopril used prn at this time BP Readings from Last 3 Encounters:  02/01/22 120/80  12/16/21 (!) 150/70  08/20/21 (!) 120/58     Hyperlipidemia associated with type 2 diabetes mellitus (HCC) ASCVD risk at 47% Hx of aorta artherosclerosis, HTN and DM Reports previous muscle cramps with statin drug but unable to remember name of med. Advised about possible benefits vs cons. Recommended low dose crestor 3x/week to help build med tolerance. She agreed. Repeat lipid panel in 21month   Reviewed medical, surgical, and social history today  Medications: Outpatient Medications Prior to Visit  Medication Sig   ALPRAZolam (XANAX) 0.5 MG tablet Take 0.5-1 tablets (0.25-0.5 mg total) by mouth daily as needed for anxiety.   Ascorbic Acid (VITAMIN C) 100 MG tablet Take 100 mg by mouth daily.   aspirin 81 MG tablet Take 81 mg by mouth daily.   atenolol (TENORMIN) 50 MG tablet TAKE 1 TABLET BY MOUTH TWICE DAILY   cholecalciferol (VITAMIN D) 1000 UNITS tablet Take 1,000 Units by mouth daily.   cyclobenzaprine (FLEXERIL) 10 MG tablet Take 10 mg by mouth daily as needed.   Fish Oil-Cholecalciferol (FISH OIL + D3 PO) Take 1,000 mg by mouth daily.   latanoprost (XALATAN) 0.005 % ophthalmic solution Place 1 drop into both eyes at bedtime.   lisinopril (ZESTRIL) 5 MG tablet Take 5 mg by mouth as needed. Only if bp is over 140   metroNIDAZOLE (FLAGYL) 500 MG tablet Take 500 mg by mouth 3 (three) times  daily. As needed for diverticulitis symptoms   Misc Natural Products ([employer] GLUCOS-CHONDROIT-MSM DS) TABS Take 1 tablet by mouth daily.   nitroGLYCERIN (NITROSTAT) 0.4 MG SL tablet Place 1 tablet (0.4 mg total) under the tongue every 5 (five) minutes as needed for chest pain. No more than 3tabs in 24hrs. Call 911 if pain does not improve   vitamin E 100 UNIT capsule Take 100 Units by mouth daily.   zolpidem (AMBIEN) 5 MG tablet TAKE 1 TABLET BY MOUTH AT BEDTIME AS NEEDED   [DISCONTINUED] levothyroxine (SYNTHROID) 25 MCG tablet Take 1 tablet (25 mcg total) by mouth daily before breakfast. (Patient not taking: Reported on 02/01/2022)   No facility-administered medications prior to visit.   Reviewed past medical and social history.   ROS per HPI above      Objective:  BP 120/80   Pulse 65   Temp (!) 96.6 F (35.9 C) (Temporal)   Ht '5\' 7"'$  (1.702 m)   Wt 192 lb 12.8 oz (87.5 kg)   SpO2 97%   BMI 30.20 kg/m      Physical Exam Vitals and nursing note reviewed.  Cardiovascular:     Rate and Rhythm: Normal rate.     Pulses: Normal pulses.  Pulmonary:     Effort: Pulmonary effort is normal.  Neurological:     Mental Status: She is alert and oriented to person,  place, and time.     No results found for any visits on 02/01/22.    Assessment & Plan:    Problem List Items Addressed This Visit       Cardiovascular and Mediastinum   Aortic atherosclerosis ([place])   Relevant Medications   rosuvastatin (CRESTOR) 5 MG tablet   Essential hypertension    Home BP 123/60 Lisinopril used prn at this time BP Readings from Last 3 Encounters:  02/01/22 120/80  12/16/21 (!) 150/70  08/20/21 (!) 120/58         Relevant Medications   rosuvastatin (CRESTOR) 5 MG tablet     Endocrine   Hyperlipidemia associated with type 2 diabetes mellitus ([place])    ASCVD risk at 47% Hx of aorta artherosclerosis, HTN and DM Reports previous muscle cramps with statin drug but unable to remember name of  med. Advised about possible benefits vs cons. Recommended low dose crestor 3x/week to help build med tolerance. She agreed. Repeat lipid panel in 68month.      Relevant Medications   rosuvastatin (CRESTOR) 5 MG tablet   Hypothyroid - Primary    Reporst she never took of levothyroxine in past Normal Tsh and mildly elevated T4  Continue to hold levothyroxine Repeat thyroid panel in 343month       Nervous and Auditory   Sensorineural hearing loss (SNHL) of both ears   Relevant Orders   Ambulatory referral to Audiology   Return for Maintain upcoming appt.     ChWilfred LacyNP

## 2022-02-19 ENCOUNTER — Encounter: Payer: Self-pay | Admitting: Internal Medicine

## 2022-02-19 ENCOUNTER — Ambulatory Visit: Payer: No Typology Code available for payment source | Attending: Internal Medicine | Admitting: Internal Medicine

## 2022-02-19 VITALS — BP 156/70 | HR 48 | Ht 68.0 in | Wt 191.0 lb

## 2022-02-19 DIAGNOSIS — E782 Mixed hyperlipidemia: Secondary | ICD-10-CM

## 2022-02-19 DIAGNOSIS — I700 Atherosclerosis of aorta: Secondary | ICD-10-CM

## 2022-02-19 DIAGNOSIS — M791 Myalgia, unspecified site: Secondary | ICD-10-CM

## 2022-02-19 DIAGNOSIS — I10 Essential (primary) hypertension: Secondary | ICD-10-CM

## 2022-02-19 DIAGNOSIS — T466X5A Adverse effect of antihyperlipidemic and antiarteriosclerotic drugs, initial encounter: Secondary | ICD-10-CM

## 2022-02-19 DIAGNOSIS — R072 Precordial pain: Secondary | ICD-10-CM

## 2022-02-19 NOTE — Progress Notes (Signed)
Cardiology Office Note:    Date:  02/19/2022   ID:  Amy Tapia, Amy Tapia 20-May-1933, MRN [id number]  PCP:  Flossie Buffy, NP   Amy Tapia Providers Cardiologist:  Werner Lean, MD     Referring MD: Flossie Buffy, NP   CC: Chest pain  History of Present Illness:    Amy Tapia is a 89 y.o. female with a hx of Aortic atherosclerosis, HLD, and HTN, prior history of breast cancer (distant) radical mastectomy only .  Saw Dr. Ron Parker in 2015. 2023: Had no further CP; after SMD we had planned to reoffer if CP returned.  Patient notes that she is doing better.   Since last visit notes that she still have rare chest pain . Pain continues to be less frequent and is non exertional.   No SOB/DOE and no PND/Orthopnea.  No weight gain or leg swelling.  No palpitations or syncope.  Has not started the rosuvastatin 0.4  because she isn't sure she wants to take it.  Ambulatory blood pressure is controlled outside of when she sees doctors.    Past Medical History:  Diagnosis Date   Arthritis    "maybe a little bit in my knees" (05/02/2014)   Breast cancer, right breast ([place])    Glaucoma of both eyes    HLD (hyperlipidemia)    Hypertension     Past Surgical History:  Procedure Laterality Date   ABDOMINAL HYSTERECTOMY  09/1971   BREAST BIOPSY Right 08/1970   CATARACT EXTRACTION W/ INTRAOCULAR LENS IMPLANT Right 04/2013   MASTECTOMY Right 08/1970   TONSILLECTOMY     TYMPANOPLASTY Bilateral ~ [id number]   "had eardrums patched w/vein from my arms"    Current Medications: Current Meds  Medication Sig   ALPRAZolam (XANAX) 0.5 MG tablet Take 0.5-1 tablets (0.25-0.5 mg total) by mouth daily as needed for anxiety.   Ascorbic Acid (VITAMIN C) 100 MG tablet Take 100 mg by mouth daily.   aspirin 81 MG tablet Take 81 mg by mouth daily.   atenolol (TENORMIN) 50 MG tablet TAKE 1 TABLET BY MOUTH TWICE DAILY   cholecalciferol (VITAMIN D) 1000 UNITS tablet Take 1,000 Units by  mouth daily.   cyclobenzaprine (FLEXERIL) 10 MG tablet Take 10 mg by mouth daily as needed.   Fish Oil-Cholecalciferol (FISH OIL + D3 PO) Take 1,000 mg by mouth daily.   latanoprost (XALATAN) 0.005 % ophthalmic solution Place 1 drop into both eyes at bedtime.   lisinopril (ZESTRIL) 5 MG tablet Take 5 mg by mouth as needed. Only if bp is over 140   metroNIDAZOLE (FLAGYL) 500 MG tablet Take 500 mg by mouth 3 (three) times daily. As needed for diverticulitis symptoms   Misc Natural Products ([employer] GLUCOS-CHONDROIT-MSM DS) TABS Take 1 tablet by mouth daily.   nitroGLYCERIN (NITROSTAT) 0.4 MG SL tablet Place 1 tablet (0.4 mg total) under the tongue every 5 (five) minutes as needed for chest pain. No more than 3tabs in 24hrs. Call 911 if pain does not improve   vitamin E 100 UNIT capsule Take 100 Units by mouth daily.   zolpidem (AMBIEN) 5 MG tablet TAKE 1 TABLET BY MOUTH AT BEDTIME AS NEEDED     Allergies:   Penicillins, Sular [nisoldipine], and Amlodipine besylate   Social History   Socioeconomic History   Marital status: Married    Spouse name: Not on file   Number of children: Not on file   Years of education: Not on file  Highest education level: Not on file  Occupational History   Not on file  Tobacco Use   Smoking status: Never   Smokeless tobacco: Never  Vaping Use   Vaping Use: Never used  Substance and Sexual Activity   Alcohol use: No   Drug use: No   Sexual activity: Not Currently  Other Topics Concern   Not on file  Social History Narrative   Not on file   Social Determinants of Health   Financial Resource Strain: Low Risk  (04/21/2021)   Overall Financial Resource Strain (CARDIA)    Difficulty of Paying Living Expenses: Not hard at all  Food Insecurity: No Food Insecurity (04/21/2021)   Hunger Vital Sign    Worried About Running Out of Food in the Last Year: Never true    Ran Out of Food in the Last Year: Never true  Transportation Needs: No Transportation Needs  (04/21/2021)   PRAPARE - Hydrologist (Medical): No    Lack of Transportation (Non-Medical): No  Physical Activity: Inactive (04/21/2021)   Exercise Vital Sign    Days of Exercise per Week: 0 days    Minutes of Exercise per Session: 0 min  Stress: No Stress Concern Present (04/21/2021)   [place]    Feeling of Stress : Not at all  Social Connections: Moderately Integrated (04/21/2021)   Social Connection and Isolation Panel [NHANES]    Frequency of Communication with Friends and Family: Twice a week    Frequency of Social Gatherings with Friends and Family: Twice a week    Attends Religious Services: More than 4 times per year    Active Member of [employer] or Organizations: No    Attends Music therapist: Never    Marital Status: Married     Family History: The patient's family history includes Arthritis in her sister; Breast cancer in her maternal aunt; Glaucoma in her mother; Heart disease in her brother; Hyperthyroidism in her mother; Rheumatic fever in her sister.  ROS:   Please see the history of present illness.     All other systems reviewed and are negative.  EKGs/Labs/Other Studies Reviewed:    The following studies were reviewed today:  EKG:   08/18/21: Sinus bradycardia with 1st HB  Recent Labs: 03/02/2021: ALT 15 08/18/2021: Hemoglobin 13.7; Platelets 245.0 12/16/2021: BUN 19; Creatinine, Ser 0.75; Potassium 4.7; Sodium 139; TSH 3.08  Recent Lipid Panel    Component Value Date/Time   CHOL 208 (H) 03/02/2021 1209   TRIG 131.0 03/02/2021 1209   HDL 65.80 03/02/2021 1209   CHOLHDL 3 03/02/2021 1209   VLDL 26.2 03/02/2021 1209   LDLCALC 116 (H) 03/02/2021 1209   LDLDIRECT 110.0 12/16/2021 1156        Risk Assessment/Calculations:    HYPERTENSION CONTROL Vitals:   02/19/22 0946 02/19/22 1021  BP: (!) 158/82 (!) 156/70    The patient's blood pressure is  elevated above target today.  In order to address the patient's elevated BP: The blood pressure is usually elevated in clinic.  Blood pressures monitored at home have been optimal.      Physical Exam:    VS:  BP (!) 156/70   Pulse (!) 48   Ht 5' 8"$  (1.727 m)   Wt 191 lb (86.6 kg)   BMI 29.04 kg/m     Wt Readings from Last 3 Encounters:  02/19/22 191 lb (86.6 kg)  02/01/22 192 lb  12.8 oz (87.5 kg)  12/16/21 198 lb (89.8 kg)    GEN:  Well nourished, well developed in no acute distress HEENT: Normal NECK: No JVD CARDIAC: RRR, no murmurs, rubs, gallops RESPIRATORY:  Clear to auscultation without rales, wheezing or rhonchi  ABDOMEN: Soft, non-tender, non-distended MUSCULOSKELETAL:  No edema; No deformity  SKIN: Warm and dry NEUROLOGIC:  Alert and oriented x 3 PSYCHIATRIC:  Normal affect   ASSESSMENT:    1. Precordial pain   2. Essential hypertension   3. Aortic atherosclerosis ([place])   4. Myalgia due to statin   5. Mixed hyperlipidemia     PLAN:     Precordial CP -will get CCTA to rule out CAD given persistent symptoms - continue ASA - PRN nitro (none attempted)  HTN - white coat variant - encouraged her to try new ACEi; she will bring her outpatient log to future visits  HLD complicated by statin myalgia Aortic atherosclerosis - encouraged her to retry the 3X weekly statin  4 months with APP unless LM/pLAD disease   Medication Adjustments/Labs and Tests Ordered: Current medicines are reviewed at length with the patient today.  Concerns regarding medicines are outlined above.  Orders Placed This Encounter  Procedures   CT CORONARY MORPH W/CTA COR W/SCORE W/CA W/CM &/OR WO/CM   Basic metabolic panel   No orders of the defined types were placed in this encounter.   Patient Instructions  Medication Instructions:  Your physician recommends that you continue on your current medications as directed. Please refer to the Current Medication list given to you  today. PLEASE START: rosuvastatin (Crestor) 5 mg by mouth 3 times weekly  *If you need a refill on your cardiac medications before your next appointment, please call your pharmacy*   Lab Work: TODAY: BMP  If you have labs (blood work) drawn today and your tests are completely normal, you will receive your results only by: [place] (if you have MyChart) OR A paper copy in the mail If you have any lab test that is abnormal or we need to change your treatment, we will call you to review the results.   Testing/Procedures: Your physician has requested that you have cardiac CT. Cardiac computed tomography (CT) is a painless test that uses an x-ray machine to take clear, detailed pictures of your heart. For further information please visit [email or web address].[email or web address]. Please follow instruction sheet as given.     Follow-Up: At [facility], you and your health needs are our priority.  As part of our continuing mission to provide you with exceptional heart care, we have created designated Provider Care Teams.  These Care Teams include your primary Cardiologist (physician) and Advanced Practice Providers (APPs -  Physician Assistants and Nurse Practitioners) who all work together to provide you with the care you need, when you need it.  We recommend signing up for the patient portal called "MyChart".  Sign up information is provided on this After Visit Summary.  MyChart is used to connect with patients for Virtual Visits (Telemedicine).  Patients are able to view lab/test results, encounter notes, upcoming appointments, etc.  Non-urgent messages can be sent to your provider as well.   To learn more about what you can do with MyChart, go to [email or web address].    Your next appointment:   4 month(s)  Provider:   Nicholes Rough, PA-C, Ambrose Pancoast, NP, or Christen Bame, NP       Other Instructions   Your cardiac CT will be scheduled at  one of the below locations:   [facility] [address] [place], [place] [address] [phone]  [place] [address] [place], [place] [address] [phone]  [facility] [place], [place] [address] [phone]  If scheduled at [facility], please arrive at the [facility] and Children's Entrance (Entrance C2) of [facility] 30 minutes prior to test start time. You can use the FREE valet parking offered at entrance C (encouraged to control the heart rate for the test)  Proceed to the [facility] Radiology Department (first floor) to check-in and test prep.  All radiology patients and guests should use entrance C2 at [facility], accessed from [facility], even though the hospital's physical address listed is [address]    If scheduled at [facility] or [facility], please arrive 15 mins early for check-in and test prep.   Please follow these instructions carefully (unless otherwise directed):   On the Night Before the Test: Be sure to Drink plenty of water. Do not consume any caffeinated/decaffeinated beverages or chocolate 12 hours prior to your test. Do not take any antihistamines 12 hours prior to your test.  On the Day of the Test: Drink plenty of water until 1 hour prior to the test. Do not eat any food 1 hour prior to test. You may take your regular medications prior to the test.  If you take Furosemide/Hydrochlorothiazide/Spironolactone, please HOLD on the morning of the test. FEMALES- please wear underwire-free bra if available, avoid dresses & tight clothing        After the Test: Drink plenty of water. After receiving IV contrast, you may experience a mild flushed feeling. This is normal. On occasion, you may experience a mild rash up to 24 hours after the test. This is not dangerous. If this  occurs, you can take Benadryl 25 mg and increase your fluid intake. If you experience trouble breathing, this can be serious. If it is severe call 911 IMMEDIATELY. If it is mild, please call our office. If you take any of these medications: Glipizide/Metformin, Avandament, Glucavance, please do not take 48 hours after completing test unless otherwise instructed.  We will call to schedule your test 2-4 weeks out understanding that some insurance companies will need an authorization prior to the service being performed.   For non-scheduling related questions, please contact the cardiac imaging nurse navigator should you have any questions/concerns: Marchia Bond, Cardiac Imaging Nurse Navigator Gordy Clement, Cardiac Imaging Nurse Navigator [facility] Direct Office Dial: [phone]   For scheduling needs, including cancellations and rescheduling, please call [place], [phone].     Signed, Werner Lean, MD  02/19/2022 10:23 AM    [place]

## 2022-02-19 NOTE — Patient Instructions (Signed)
Medication Instructions:  Your physician recommends that you continue on your current medications as directed. Please refer to the Current Medication list given to you today. PLEASE START: rosuvastatin (Crestor) 5 mg by mouth 3 times weekly  *If you need a refill on your cardiac medications before your next appointment, please call your pharmacy*   Lab Work: TODAY: BMP  If you have labs (blood work) drawn today and your tests are completely normal, you will receive your results only by: [place] (if you have MyChart) OR A paper copy in the mail If you have any lab test that is abnormal or we need to change your treatment, we will call you to review the results.   Testing/Procedures: Your physician has requested that you have cardiac CT. Cardiac computed tomography (CT) is a painless test that uses an x-ray machine to take clear, detailed pictures of your heart. For further information please visit [email or web address]. Please follow instruction sheet as given.     Follow-Up: At [facility], you and your health needs are our priority.  As part of our continuing mission to provide you with exceptional heart care, we have created designated Provider Care Teams.  These Care Teams include your primary Cardiologist (physician) and Advanced Practice Providers (APPs -  Physician Assistants and Nurse Practitioners) who all work together to provide you with the care you need, when you need it.  We recommend signing up for the patient portal called "MyChart".  Sign up information is provided on this After Visit Summary.  MyChart is used to connect with patients for Virtual Visits (Telemedicine).  Patients are able to view lab/test results, encounter notes, upcoming appointments, etc.  Non-urgent messages can be sent to your provider as well.   To learn more about what you can do with MyChart, go to [email or web address].    Your next appointment:   4 month(s)  Provider:   Nicholes Rough, PA-C, Ambrose Pancoast, NP, or Christen Bame, NP       Other Instructions   Your cardiac CT will be scheduled at one of the below locations:   [facility] [address] [place], [place] [address] [phone]  [place] [address] [place], [place] [address] [phone]  [facility] [place], [place] [address] [phone]  If scheduled at [facility], please arrive at the [facility] and Children's Entrance (Entrance C2) of [facility] 30 minutes prior to test start time. You can use the FREE valet parking offered at entrance C (encouraged to control the heart rate for the test)  Proceed to the [facility] Radiology Department (first floor) to check-in and test prep.  All radiology patients and guests should use entrance C2 at [facility], accessed from [facility], even though the hospital's physical address listed is [address]    If scheduled at [facility] or [facility], please arrive 15 mins early for check-in and test prep.   Please follow these instructions carefully (unless otherwise directed):   On the Night Before the Test: Be sure to Drink plenty of water. Do not consume any caffeinated/decaffeinated beverages or chocolate 12 hours prior to your test. Do not take any antihistamines 12 hours prior to your test.  On the Day of the Test: Drink plenty of water until 1 hour prior to the test. Do not eat any  food 1 hour prior to test. You may take your regular medications prior to the test.  If you take Furosemide/Hydrochlorothiazide/Spironolactone, please HOLD on the morning of the test. FEMALES- please wear underwire-free bra if available, avoid dresses & tight clothing        After the Test: Drink plenty of water. After receiving IV contrast,  you may experience a mild flushed feeling. This is normal. On occasion, you may experience a mild rash up to 24 hours after the test. This is not dangerous. If this occurs, you can take Benadryl 25 mg and increase your fluid intake. If you experience trouble breathing, this can be serious. If it is severe call 911 IMMEDIATELY. If it is mild, please call our office. If you take any of these medications: Glipizide/Metformin, Avandament, Glucavance, please do not take 48 hours after completing test unless otherwise instructed.  We will call to schedule your test 2-4 weeks out understanding that some insurance companies will need an authorization prior to the service being performed.   For non-scheduling related questions, please contact the cardiac imaging nurse navigator should you have any questions/concerns: Marchia Bond, Cardiac Imaging Nurse Navigator Gordy Clement, Cardiac Imaging Nurse Navigator  [facility] Direct Office Dial: [phone]   For scheduling needs, including cancellations and rescheduling, please call [place], [phone].

## 2022-02-20 LAB — BASIC METABOLIC PANEL
BUN/Creatinine Ratio: 18 (ref 12–28)
BUN: 15 mg/dL (ref 8–27)
CO2: 25 mmol/L (ref 20–29)
Calcium: 9.7 mg/dL (ref 8.7–10.3)
Chloride: 103 mmol/L (ref 96–106)
Creatinine, Ser: 0.83 mg/dL (ref 0.57–1.00)
Glucose: 106 mg/dL — ABNORMAL HIGH (ref 70–99)
Potassium: 4.8 mmol/L (ref 3.5–5.2)
Sodium: 140 mmol/L (ref 134–144)
eGFR: 69 mL/min/{1.73_m2} (ref >59)

## 2022-02-26 ENCOUNTER — Telehealth (HOSPITAL_COMMUNITY): Payer: Self-pay | Admitting: *Deleted

## 2022-02-26 NOTE — Telephone Encounter (Signed)
Reaching out to patient to offer assistance regarding upcoming cardiac imaging study; pt verbalizes understanding of appt date/time, parking situation and where to check in, pre-test NPO status, and verified current allergies; name and call back number provided for further questions should they arise  Gordy Clement RN Navigator Cardiac Imaging [facility] [phone] office [phone] cell  Patient to take daily medications and is aware to arrive at 12 PM.

## 2022-03-01 ENCOUNTER — Ambulatory Visit (HOSPITAL_COMMUNITY)
Admission: RE | Admit: 2022-03-01 | Discharge: 2022-03-01 | Disposition: A | Discharge status: Home / Self Care | Payer: No Typology Code available for payment source | Source: Ambulatory Visit | Attending: Internal Medicine | Admitting: Internal Medicine

## 2022-03-01 DIAGNOSIS — R072 Precordial pain: Secondary | ICD-10-CM | POA: Insufficient documentation

## 2022-03-01 MED ORDER — NITROGLYCERIN 0.4 MG SL SUBL
SUBLINGUAL_TABLET | SUBLINGUAL | Status: AC
  Filled 2022-03-01: qty 2

## 2022-03-01 MED ORDER — NITROGLYCERIN 0.4 MG SL SUBL
0.8000 mg | SUBLINGUAL_TABLET | Freq: Once | SUBLINGUAL | Status: AC
  Administered 2022-03-01: 0.8 mg via SUBLINGUAL

## 2022-03-01 MED ORDER — IOHEXOL 350 MG/ML SOLN
95.0000 mL | Freq: Once | INTRAVENOUS | Status: AC | PRN
  Administered 2022-03-01: 95 mL via INTRAVENOUS

## 2022-03-04 ENCOUNTER — Telehealth: Payer: Self-pay | Admitting: Internal Medicine

## 2022-03-04 NOTE — Telephone Encounter (Signed)
The patient has been notified of the result and verbalized understanding.  All questions (if any) were answered.  Encouraged the pt to continue checking her pressures at home and to continue her current statin regimen.   Pt verbalized understanding and agrees with this plan.

## 2022-03-04 NOTE — Telephone Encounter (Signed)
Patient is returning call to discuss CT results. 

## 2022-03-04 NOTE — Telephone Encounter (Signed)
-----   Message from Werner Lean, MD sent at 03/04/2022 11:37 AM EST ----- Results: No evidence of cardiac CP Plan: Encourage her to check BP at home and consider the statin as we discussed  Werner Lean, MD

## 2022-03-10 DIAGNOSIS — H401121 Primary open-angle glaucoma, left eye, mild stage: Secondary | ICD-10-CM | POA: Diagnosis not present

## 2022-03-17 ENCOUNTER — Encounter: Payer: Self-pay | Admitting: Nurse Practitioner

## 2022-03-17 ENCOUNTER — Other Ambulatory Visit: Payer: Self-pay

## 2022-03-17 ENCOUNTER — Ambulatory Visit (INDEPENDENT_AMBULATORY_CARE_PROVIDER_SITE_OTHER): Payer: No Typology Code available for payment source | Admitting: Nurse Practitioner

## 2022-03-17 VITALS — BP 120/80 | HR 50 | Temp 98.2°F | Resp 16 | Ht 67.0 in | Wt 191.4 lb

## 2022-03-17 DIAGNOSIS — E785 Hyperlipidemia, unspecified: Secondary | ICD-10-CM | POA: Diagnosis not present

## 2022-03-17 DIAGNOSIS — E039 Hypothyroidism, unspecified: Secondary | ICD-10-CM

## 2022-03-17 DIAGNOSIS — E1169 Type 2 diabetes mellitus with other specified complication: Secondary | ICD-10-CM | POA: Diagnosis not present

## 2022-03-17 DIAGNOSIS — Z1231 Encounter for screening mammogram for malignant neoplasm of breast: Secondary | ICD-10-CM | POA: Diagnosis not present

## 2022-03-17 DIAGNOSIS — Z9011 Acquired absence of right breast and nipple: Secondary | ICD-10-CM | POA: Insufficient documentation

## 2022-03-17 LAB — HEMOGLOBIN A1C: Hgb A1c MFr Bld: 6.4 % (ref 4.6–6.5)

## 2022-03-17 LAB — MICROALBUMIN / CREATININE URINE RATIO
Creatinine,U: 99.2 mg/dL
Microalb Creat Ratio: 0.7 mg/g (ref 0.0–30.0)
Microalb, Ur: <0.7 mg/dL (ref 0.0–1.9)

## 2022-03-17 LAB — LIPID PANEL
Cholesterol: 153 mg/dL (ref 0–200)
HDL: 53.8 mg/dL (ref >39.00)
LDL Cholesterol: 72 mg/dL (ref 0–99)
NonHDL: 99.55
Total CHOL/HDL Ratio: 3
Triglycerides: 138 mg/dL (ref 0.0–149.0)
VLDL: 27.6 mg/dL (ref 0.0–40.0)

## 2022-03-17 LAB — TSH: TSH: 5.3 u[IU]/mL (ref 0.35–5.50)

## 2022-03-17 LAB — HEPATIC FUNCTION PANEL
ALT: 11 U/L (ref 0–35)
AST: 12 U/L (ref 0–37)
Albumin: 3.6 g/dL (ref 3.5–5.2)
Alkaline Phosphatase: 56 U/L (ref 39–117)
Bilirubin, Direct: 0.1 mg/dL (ref 0.0–0.3)
Total Bilirubin: 0.5 mg/dL (ref 0.2–1.2)
Total Protein: 6.4 g/dL (ref 6.0–8.3)

## 2022-03-17 LAB — T4, FREE: Free T4: 0.82 ng/dL (ref 0.60–1.60)

## 2022-03-17 NOTE — Assessment & Plan Note (Signed)
Asymptomatic Repeat TSH and t4

## 2022-03-17 NOTE — Patient Instructions (Signed)
Go to lab Continue Heart healthy diet and daily exercise. Maintain current medications.

## 2022-03-17 NOTE — Progress Notes (Signed)
Abnormal: Improved hgbA1c: 6.6 to 6.4% Improved LDL: 116 to 72 Normal liver and thyroid function Normal urine Maintain current medications

## 2022-03-17 NOTE — Assessment & Plan Note (Signed)
Diet controlled Diminished sensation in feet (vibration), normal skin, pulse and nails Up to date with eye exam Repeat hgbA1c and UACR today

## 2022-03-17 NOTE — Assessment & Plan Note (Signed)
Denies any adverse effects with crestor 3x/week. Repeat lipid panel today

## 2022-03-17 NOTE — Progress Notes (Signed)
Established Patient Visit  Patient: Amy Tapia   DOB: 1933-12-04   89 y.o. Female  MRN: [id number] Visit Date: 03/17/2022  Subjective:    Chief Complaint  Patient presents with   Hypothyroidism   Hypertension   Hyperlipidemia    Fasting- Yes   Hypertension  Hyperlipidemia   Hyperlipidemia associated with type 2 diabetes mellitus ([place]) Denies any adverse effects with crestor 3x/week. Repeat lipid panel today  Hypothyroid Asymptomatic Repeat TSH and t4  DM (diabetes mellitus) ([place]) Diet controlled Diminished sensation in feet (vibration), normal skin, pulse and nails Up to date with eye exam Repeat hgbA1c and UACR today  BP Readings from Last 3 Encounters:  03/17/22 120/80  03/01/22 136/72  02/19/22 (!) 156/70    Reviewed medical, surgical, and social history today  Medications: Outpatient Medications Prior to Visit  Medication Sig   ALPRAZolam (XANAX) 0.5 MG tablet Take 0.5-1 tablets (0.25-0.5 mg total) by mouth daily as needed for anxiety.   Ascorbic Acid (VITAMIN C) 100 MG tablet Take 100 mg by mouth daily.   aspirin 81 MG tablet Take 81 mg by mouth daily.   atenolol (TENORMIN) 50 MG tablet TAKE 1 TABLET BY MOUTH TWICE DAILY   cholecalciferol (VITAMIN D) 1000 UNITS tablet Take 1,000 Units by mouth daily.   cyclobenzaprine (FLEXERIL) 10 MG tablet Take 10 mg by mouth daily as needed.   Docosahexaenoic Acid (ALGAL OMEGA-3 DHA PO) Take by mouth.   Fish Oil-Cholecalciferol (FISH OIL + D3 PO) Take 1,000 mg by mouth daily.   glucosamine-chondroitin 500-400 MG tablet Take by mouth.   latanoprost (XALATAN) 0.005 % ophthalmic solution Place 1 drop into both eyes at bedtime.   lisinopril (ZESTRIL) 5 MG tablet Take 5 mg by mouth as needed. Only if bp is over 140   Misc Natural Products ([employer] GLUCOS-CHONDROIT-MSM DS) TABS Take 1 tablet by mouth daily.   nitroGLYCERIN (NITROSTAT) 0.4 MG SL tablet Place 1 tablet (0.4 mg total) under the tongue every 5 (five)  minutes as needed for chest pain. No more than 3tabs in 24hrs. Call 911 if pain does not improve   Plant Sterols and Stanols (CHOLESTOFF) 450 MG TABS Take by mouth.   rosuvastatin (CRESTOR) 5 MG tablet Take 1 tablet (5 mg total) by mouth 3 (three) times a week.   vitamin E 100 UNIT capsule Take 100 Units by mouth daily.   zolpidem (AMBIEN) 5 MG tablet TAKE 1 TABLET BY MOUTH AT BEDTIME AS NEEDED   famotidine (PEPCID) 40 MG tablet Take by mouth. (Patient not taking: Reported on 03/17/2022)   metroNIDAZOLE (FLAGYL) 500 MG tablet Take 500 mg by mouth 3 (three) times daily. As needed for diverticulitis symptoms (Patient not taking: Reported on 03/17/2022)   No facility-administered medications prior to visit.   Reviewed past medical and social history.   ROS per HPI above      Objective:  BP 120/80 (BP Location: Left Arm, Patient Position: Sitting, Cuff Size: Large)   Pulse (!) 50   Temp 98.2 F (36.8 C) (Temporal)   Resp 16   Ht '5\' 7"'$  (1.702 m)   Wt 191 lb 6.4 oz (86.8 kg)   SpO2 98%   BMI 29.98 kg/m      Physical Exam Cardiovascular:     Rate and Rhythm: Normal rate and regular rhythm.     Pulses: Normal pulses.     Heart sounds: Normal heart sounds.  Pulmonary:     Effort: Pulmonary effort is normal.     Breath sounds: Normal breath sounds.  Neurological:     Mental Status: She is alert and oriented to person, place, and time.  Psychiatric:        Mood and Affect: Mood normal.        Behavior: Behavior normal.        Thought Content: Thought content normal.     No results found for any visits on 03/17/22.    Assessment & Plan:    Problem List Items Addressed This Visit       Endocrine   DM (diabetes mellitus) ([place])    Diet controlled Diminished sensation in feet (vibration), normal skin, pulse and nails Up to date with eye exam Repeat hgbA1c and UACR today      Relevant Orders   Hemoglobin A1c   Microalbumin / creatinine urine ratio   Hyperlipidemia  associated with type 2 diabetes mellitus ([place])    Denies any adverse effects with crestor 3x/week. Repeat lipid panel today      Relevant Orders   Hepatic function panel   Lipid panel   Hypothyroid - Primary    Asymptomatic Repeat TSH and t4      Relevant Orders   TSH   T4, free   Other Visit Diagnoses     Breast cancer screening by mammogram       Relevant Orders   MM 3D SCREENING MAMMOGRAM UNILATERAL LEFT BREAST      Return in about 6 months (around 09/17/2022) for HTN, DM, hyperlipidemia (fasting), Hypothyroidism.     Wilfred Lacy, NP

## 2022-03-24 DIAGNOSIS — H401111 Primary open-angle glaucoma, right eye, mild stage: Secondary | ICD-10-CM | POA: Diagnosis not present

## 2022-04-03 ENCOUNTER — Other Ambulatory Visit: Payer: Self-pay | Admitting: Nurse Practitioner

## 2022-04-05 ENCOUNTER — Other Ambulatory Visit: Payer: Self-pay

## 2022-04-06 ENCOUNTER — Other Ambulatory Visit: Payer: Self-pay | Admitting: Nurse Practitioner

## 2022-04-30 ENCOUNTER — Telehealth: Payer: Self-pay | Admitting: Nurse Practitioner

## 2022-04-30 NOTE — Telephone Encounter (Signed)
Called patient to schedule Medicare Annual Wellness Visit (AWV). Left message for patient to call back and schedule Medicare Annual Wellness Visit (AWV).  Last date of AWV: 04/21/21  Please schedule an appointment at any time with [facility].  If any questions, please contact me at [phone].  Thank you ,  Rudell Cobb AWV direct phone # [phone]

## 2022-05-11 ENCOUNTER — Ambulatory Visit: Payer: No Typology Code available for payment source | Admitting: Nurse Practitioner

## 2022-05-11 ENCOUNTER — Encounter: Payer: Self-pay | Admitting: Nurse Practitioner

## 2022-05-11 VITALS — BP 118/78 | HR 55 | Temp 98.2°F | Resp 16 | Ht 67.0 in | Wt 192.0 lb

## 2022-05-11 DIAGNOSIS — Z91038 Other insect allergy status: Secondary | ICD-10-CM

## 2022-05-11 MED ORDER — MUPIROCIN 2 % EX OINT: 1.0000 | TOPICAL_OINTMENT | Freq: Two times a day (BID) | CUTANEOUS | 0 refills | Status: DC

## 2022-05-11 NOTE — Progress Notes (Signed)
Established Patient Visit  Patient: JEVON LITTLEPAGE   DOB: 1933/08/31   89 y.o. Female  MRN: [id number] Visit Date: 05/11/2022  Subjective:    Chief Complaint  Patient presents with   Insect Bite    Ant bites on left foot.      Rash This is a new problem. The current episode started in the past 7 days. The problem is unchanged. The affected locations include the left foot. The rash is characterized by itchiness and redness. She was exposed to an insect bite/sting. Pertinent negatives include no fatigue, fever or joint pain. Past treatments include cold compress. The treatment provided mild relief.   Reviewed medical, surgical, and social history today  Medications: Outpatient Medications Prior to Visit  Medication Sig   Ascorbic Acid (VITAMIN C) 100 MG tablet Take 100 mg by mouth daily.   aspirin 81 MG tablet Take 81 mg by mouth daily.   atenolol (TENORMIN) 50 MG tablet TAKE 1 TABLET BY MOUTH TWICE DAILY   cholecalciferol (VITAMIN D) 1000 UNITS tablet Take 1,000 Units by mouth daily.   cyclobenzaprine (FLEXERIL) 10 MG tablet Take 10 mg by mouth daily as needed.   Docosahexaenoic Acid (ALGAL OMEGA-3 DHA PO) Take by mouth.   Fish Oil-Cholecalciferol (FISH OIL + D3 PO) Take 1,000 mg by mouth daily.   glucosamine-chondroitin 500-400 MG tablet Take by mouth.   latanoprost (XALATAN) 0.005 % ophthalmic solution Place 1 drop into both eyes at bedtime.   lisinopril (ZESTRIL) 5 MG tablet Take 5 mg by mouth as needed. Only if bp is over 140   Misc Natural Products (CVS GLUCOS-CHONDROIT-MSM DS) TABS Take 1 tablet by mouth daily.   nitroGLYCERIN (NITROSTAT) 0.4 MG SL tablet Place 1 tablet (0.4 mg total) under the tongue every 5 (five) minutes as needed for chest pain. No more than 3tabs in 24hrs. Call 911 if pain does not improve   Plant Sterols and Stanols (CHOLESTOFF) 450 MG TABS Take by mouth.   rosuvastatin (CRESTOR) 5 MG tablet Take 1 tablet (5 mg total) by mouth 3 (three) times  a week.   vitamin E 100 UNIT capsule Take 100 Units by mouth daily.   zolpidem (AMBIEN) 5 MG tablet TAKE 1 TABLET BY MOUTH AT BEDTIME AS NEEDED   No facility-administered medications prior to visit.   Reviewed past medical and social history.   ROS per HPI above      Objective:  BP 118/78 (BP Location: Left Arm, Patient Position: Sitting, Cuff Size: Large)   Pulse (!) 55   Temp 98.2 F (36.8 C) (Temporal)   Resp 16   Ht 5\' 7"  (1.702 m)   Wt 192 lb (87.1 kg)   SpO2 98%   BMI 30.07 kg/m      Physical Exam Vitals reviewed.  Constitutional:      General: She is not in acute distress. Musculoskeletal:     Right lower leg: No edema.     Left lower leg: No edema.  Skin:    Findings: Rash present. Rash is papular.          Comments: Mild erythema.  Neurological:     Mental Status: She is alert.     No results found for any visits on 05/11/22.    Assessment & Plan:    Problem List Items Addressed This Visit   None Visit Diagnoses     Allergy to ant bite    -  Primary   Relevant Medications   mupirocin ointment (BACTROBAN) 2 %     Use benadryl cream as directed on package. Use Bactroban ointment if no improvement in redness does not improvement in 3days.  Return if symptoms worsen or fail to improve.     Alysia Penna, NP

## 2022-05-11 NOTE — Patient Instructions (Addendum)
Use benadryl cream as directed on package. Use Bactroban ointment if no improvement in redness does not improvement in 3days.  Insect Bite, Adult An insect bite can make your skin red, itchy, and swollen. Some insects can spread disease to people with a bite. However, most insect bites do not lead to disease, and most are not serious. What are the causes? Insects may bite for many reasons, including: Hunger. To defend themselves. Insects that bite include: Spiders. Mosquitoes. Flies. Ticks and fleas. Ants. Kissing bugs. Chiggers. What are the signs or symptoms? Symptoms often last for 2-4 days. However, itching can last up to 10 days. Symptoms include: Itching or pain in the bite area. Redness and swelling in the bite area. An open wound. In rare cases, a person may have a very bad allergic reaction (anaphylactic reaction) to a bite. Symptoms of an anaphylactic reaction may include: Feeling warm in the face (flushed). Your face may turn red. Itchy, red, swollen areas of skin (hives). Swelling of the eyes, lips, face, mouth, tongue, or throat. Trouble with breathing, talking, or swallowing. High-pitched whistling sounds, most often when breathing out (wheezing). Feeling dizzy or light-headed. Fainting. Pain or cramps in your belly (abdomen). Vomiting. Watery poop (diarrhea). How is this treated? Most insect bites are not serious. Symptoms often go away on their own. When treatment is advised, it may include: Putting ice on the bite area. Putting a cream or lotion, like calamine lotion, on the bite area. This helps with itching. Using medicines called antihistamines. You may also need: A tetanus shot if you are not up to date. An antibiotic cream or medicine. This treatment is needed if the bite area gets infected. Follow these instructions at home: Bite area care  Do not scratch the bite area. It may help to cover the bite area with a bandage or close-fitting  clothing. Keep the bite area clean and dry. Check the bite area every day for signs of infection. Check for: More redness, swelling, or pain. Fluid or blood. Warmth. Pus or a bad smell. Wash your hands often. Managing pain, itching, and swelling  You may put any of these on the bite area as told by your doctor: A paste made of baking soda and water. Cortisone cream. Calamine lotion. If told, put ice on the bite area. To do this: Put ice in a plastic bag. Place a towel between your skin and the bag. Leave the ice on for 20 minutes, 2-3 times a day. If your skin turns bright red, take off the ice right away to prevent skin damage. The risk of skin damage is higher if you cannot feel pain, heat, or cold. General instructions Apply or take over-the-counter and prescription medicines only as told by your doctor. If you were prescribed antibiotics, take or apply them as told by your doctor. Do not stop using them even if you start to feel better. How is this prevented? To help you have a lower risk of insect bites: When you are outside, wear clothes that cover your arms and legs. Use insect repellent. The best insect repellents contain one of these: DEET. Picaridin. Oil of lemon eucalyptus (OLE). [id number]. Consider spraying your clothing with a pesticide called permethrin. Permethrin helps prevent insect bites. It works for several weeks and for up to 5-6 clothing washes. Do not apply permethrin directly to the skin. If your home windows do not have screens, think about putting some in. If you will be sleeping in an area where there  are mosquitoes, consider covering your sleeping area with a mosquito net. Contact a doctor if: You have redness, swelling, or pain in the bite area. You have fluid or blood coming from the bite area. The bite area feels warm to the touch. You have pus or a bad smell coming from the bite area. You have a fever. Get help right away if: You have joint  pain. You have a rash. You feel weak or more tired than you normally do. You have neck pain or a headache. You have signs of an anaphylactic reaction. Signs may include: Swelling of your eyes, lips, face, mouth, tongue, or throat. Feeling warm in the face. Itchy, red, swollen areas of skin. Trouble with breathing, talking, or swallowing. Wheezing. Feeling dizzy or light-headed. Fainting. Pain or cramps in your belly. Vomiting or watery poop. These symptoms may be an emergency. Get help right away. Call 911. Do not wait to see if symptoms will go away. Do not drive yourself to the hospital. Summary An insect bite can make your skin red, itchy, and swollen. Treatment is usually not needed. Symptoms often go away on their own. Do not scratch the bite area. Keep it clean and dry. Use insect repellent to help prevent insect bites. Contact a doctor if you have signs of infection. This information is not intended to replace advice given to you by your health care provider. Make sure you discuss any questions you have with your health care provider. Document Revised: 03/24/2021 Document Reviewed: 03/24/2021 Elsevier Patient Education  2023 [employer].

## 2022-05-25 ENCOUNTER — Telehealth: Payer: Self-pay

## 2022-05-25 NOTE — Telephone Encounter (Signed)
Patient Advocate Encounter  Received a fax from [facility] regarding Prior Authorization for Zolpidem.   Authorization has been DENIED due to    Determination letter attached to patient chart

## 2022-05-27 ENCOUNTER — Encounter: Payer: Self-pay | Admitting: Nurse Practitioner

## 2022-05-27 DIAGNOSIS — H04123 Dry eye syndrome of bilateral lacrimal glands: Secondary | ICD-10-CM | POA: Diagnosis not present

## 2022-05-27 DIAGNOSIS — H35033 Hypertensive retinopathy, bilateral: Secondary | ICD-10-CM | POA: Diagnosis not present

## 2022-05-27 DIAGNOSIS — H35363 Drusen (degenerative) of macula, bilateral: Secondary | ICD-10-CM | POA: Diagnosis not present

## 2022-05-27 DIAGNOSIS — H524 Presbyopia: Secondary | ICD-10-CM | POA: Diagnosis not present

## 2022-05-27 DIAGNOSIS — H401131 Primary open-angle glaucoma, bilateral, mild stage: Secondary | ICD-10-CM | POA: Diagnosis not present

## 2022-06-08 ENCOUNTER — Telehealth: Payer: Self-pay | Admitting: Nurse Practitioner

## 2022-06-08 NOTE — Telephone Encounter (Signed)
Pt is expecting a referral to her hearing Dr. Quincy Carnes have told her they have not received it. Their fax # is [phone] She could not remember their name.

## 2022-06-08 NOTE — Telephone Encounter (Signed)
Patient called and want to let you know that she said it is [facility]

## 2022-06-09 ENCOUNTER — Ambulatory Visit
Admission: RE | Admit: 2022-06-09 | Discharge: 2022-06-09 | Disposition: A | Discharge status: Home / Self Care | Payer: No Typology Code available for payment source | Source: Ambulatory Visit | Attending: Nurse Practitioner | Admitting: Nurse Practitioner

## 2022-06-09 DIAGNOSIS — Z90710 Acquired absence of both cervix and uterus: Secondary | ICD-10-CM | POA: Diagnosis not present

## 2022-06-09 DIAGNOSIS — Z8781 Personal history of (healed) traumatic fracture: Secondary | ICD-10-CM | POA: Diagnosis not present

## 2022-06-09 DIAGNOSIS — E039 Hypothyroidism, unspecified: Secondary | ICD-10-CM | POA: Diagnosis not present

## 2022-06-09 DIAGNOSIS — E349 Endocrine disorder, unspecified: Secondary | ICD-10-CM | POA: Diagnosis not present

## 2022-06-09 DIAGNOSIS — Z90722 Acquired absence of ovaries, bilateral: Secondary | ICD-10-CM | POA: Diagnosis not present

## 2022-06-09 DIAGNOSIS — Z780 Asymptomatic menopausal state: Secondary | ICD-10-CM

## 2022-06-09 DIAGNOSIS — Z853 Personal history of malignant neoplasm of breast: Secondary | ICD-10-CM | POA: Diagnosis not present

## 2022-06-09 DIAGNOSIS — E28310 Symptomatic premature menopause: Secondary | ICD-10-CM | POA: Diagnosis not present

## 2022-06-09 DIAGNOSIS — M810 Age-related osteoporosis without current pathological fracture: Secondary | ICD-10-CM | POA: Diagnosis not present

## 2022-06-20 NOTE — Progress Notes (Deleted)
Office Visit    Patient Name: Amy Tapia Date of Encounter: 06/20/2022  Primary Care Provider:  Anne Ng, NP Primary Cardiologist:  Christell Constant, MD Primary Electrophysiologist: None   Past Medical History    Past Medical History:  Diagnosis Date   Arthritis    "maybe a little bit in my knees" (05/02/2014)   Breast cancer, right breast (HCC)    Glaucoma of both eyes    HLD (hyperlipidemia)    Hypertension    Past Surgical History:  Procedure Laterality Date   ABDOMINAL HYSTERECTOMY  09/1971   BREAST BIOPSY Right 08/1970   CATARACT EXTRACTION W/ INTRAOCULAR LENS IMPLANT Right 04/2013   MASTECTOMY Right 08/1970   TONSILLECTOMY     TYMPANOPLASTY Bilateral ~ 1610   "had eardrums patched w/vein from my arms"    Allergies  Allergies  Allergen Reactions   Penicillins Shortness Of Breath and Swelling    Has patient had a PCN reaction causing immediate rash, facial/tongue/throat swelling, SOB or lightheadedness with hypotension: Yes Has patient had a PCN reaction causing severe rash involving mucus membranes or skin necrosis: Yes Has patient had a PCN reaction that required hospitalization: No Has patient had a PCN reaction occurring within the last 10 years: No If all of the above answers are "NO", then may proceed with Cephalosporin use.    Sular [Nisoldipine] Palpitations   Amlodipine Besylate Other (See Comments)     History of Present Illness    Amy Tapia  is a 89 year old female with a PMH of nonobstructive CAD, aortic atherosclerosis, HTN, HLD, breast CA s/p radical mastectomy, arthritis who presents today for 67-month follow-up.  Ms. Amy Tapia was seen initially by Dr. Myrtis Ser in 10/2013 for chest discomfort.  She underwent for ACS workup that showed nonischemic causes.  She was seen by Dr. Raynelle Jan on 08/20/2021 for evaluation of chest pain.  During visit patient did not endorse chest discomfort.  Shared decision was made to defer testing and  possibly explore if chest discomfort returns.  She was last seen 02/19/2022 and patient reported rare chest discomfort with no exertional symptoms.  She underwent cardiac CTA to rule out possible CAD that showed calcium score of 0 with 9 calcified plaque in the RCA with minimal stenosis of 0-24%.  She was continued on primary prevention with ASA and statin.   Since last being seen in the office patient reports***.  Patient denies chest pain, palpitations, dyspnea, PND, orthopnea, nausea, vomiting, dizziness, syncope, edema, weight gain, or early satiety.    ***Notes:  Home Medications    Current Outpatient Medications  Medication Sig Dispense Refill   Ascorbic Acid (VITAMIN C) 100 MG tablet Take 100 mg by mouth daily.     aspirin 81 MG tablet Take 81 mg by mouth daily.     atenolol (TENORMIN) 50 MG tablet TAKE 1 TABLET BY MOUTH TWICE DAILY 180 tablet 1   cholecalciferol (VITAMIN D) 1000 UNITS tablet Take 1,000 Units by mouth daily.     cyclobenzaprine (FLEXERIL) 10 MG tablet Take 10 mg by mouth daily as needed.     Docosahexaenoic Acid (ALGAL OMEGA-3 DHA PO) Take by mouth.     Fish Oil-Cholecalciferol (FISH OIL + D3 PO) Take 1,000 mg by mouth daily.     glucosamine-chondroitin 500-400 MG tablet Take by mouth.     latanoprost (XALATAN) 0.005 % ophthalmic solution Place 1 drop into both eyes at bedtime.     lisinopril (ZESTRIL) 5  MG tablet Take 5 mg by mouth as needed. Only if bp is over 140     Misc Natural Products (CVS GLUCOS-CHONDROIT-MSM DS) TABS Take 1 tablet by mouth daily.     mupirocin ointment (BACTROBAN) 2 % Apply 1 Application topically 2 (two) times daily. 15 g 0   nitroGLYCERIN (NITROSTAT) 0.4 MG SL tablet Place 1 tablet (0.4 mg total) under the tongue every 5 (five) minutes as needed for chest pain. No more than 3tabs in 24hrs. Call 911 if pain does not improve 30 tablet 0   Plant Sterols and Stanols (CHOLESTOFF) 450 MG TABS Take by mouth.     rosuvastatin (CRESTOR) 5 MG tablet  Take 1 tablet (5 mg total) by mouth 3 (three) times a week. 12 tablet 5   vitamin E 100 UNIT capsule Take 100 Units by mouth daily.     zolpidem (AMBIEN) 5 MG tablet TAKE 1 TABLET BY MOUTH AT BEDTIME AS NEEDED 30 tablet 2   No current facility-administered medications for this visit.     Review of Systems  Please see the history of present illness.    (+)*** (+)***  All other systems reviewed and are otherwise negative except as noted above.  Physical Exam    Wt Readings from Last 3 Encounters:  05/11/22 192 lb (87.1 kg)  03/17/22 191 lb 6.4 oz (86.8 kg)  02/19/22 191 lb (86.6 kg)   WU:[id number] were no vitals filed for this visit.,There is no height or weight on file to calculate BMI.  Constitutional:      Appearance: Healthy appearance. Not in distress.  Neck:     Vascular: JVD normal.  Pulmonary:     Effort: Pulmonary effort is normal.     Breath sounds: No wheezing. No rales. Diminished in the bases Cardiovascular:     Normal rate. Regular rhythm. Normal S1. Normal S2.      Murmurs: There is no murmur.  Edema:    Peripheral edema absent.  Abdominal:     Palpations: Abdomen is soft non tender. There is no hepatomegaly.  Skin:    General: Skin is warm and dry.  Neurological:     General: No focal deficit present.     Mental Status: Alert and oriented to person, place and time.     Cranial Nerves: Cranial nerves are intact.  EKG/LABS/ Recent Cardiac Studies    ECG personally reviewed by me today - ***  Cardiac Studies & Procedures          CT SCANS  CT CORONARY MORPH W/CTA COR W/SCORE 03/03/2022  Addendum 03/03/2022  5:15 PM ADDENDUM REPORT: 03/03/2022 17:13  EXAM: OVER-READ INTERPRETATION  CT CHEST  The following report is an over-read performed by radiologist Dr. Curly Shores [facility] on 03/03/2022. This over-read does not include interpretation of cardiac or coronary anatomy or pathology. The coronary CTA interpretation by  the cardiologist is attached.  COMPARISON:  None.  FINDINGS: Cardiovascular: Mild cardiomegaly. Opacification of the pulmonary arteries with suboptimal for evaluation for PE. No large central pulmonary artery filling defects are identified.  Mediastinum/Nodes: No suspicious mediastinal or hilar adenopathy identified. There is a small hiatal hernia.  Lungs/Pleura: Bibasilar linear subsegmental atelectasis or scarring. Lungs are otherwise clear. No pleural effusion or pneumothorax.  Upper Abdomen: No acute abnormality.  Musculoskeletal: Status post right mastectomy. Thoracic degenerative disc disease.  IMPRESSION: 1. Mild cardiomegaly. 2. Small hiatal hernia. 3. Bibasilar subsegmental atelectasis or scarring.   Electronically Signed By: Charlaine Dalton.D.  On: 03/03/2022 17:13  Narrative CLINICAL DATA:  24F with chest pain  EXAM: Cardiac/Coronary CTA  TECHNIQUE: The patient was scanned on a [employer].  FINDINGS: A 100 kV prospective scan was triggered in the descending thoracic aorta at 111 HU's. Axial non-contrast 3 mm slices were carried out through the heart. The data set was analyzed on a dedicated work station and scored using the Agatson method. Gantry rotation speed was 250 msecs and collimation was .6 mm. No beta blockade and 0.8 mg of sl NTG was given. The 3D data set was reconstructed in 5% intervals of the 35-75% of the R-R cycle. Phases were analyzed on a dedicated work station using MPR, MIP and VRT modes. The patient received 100 cc of contrast.  Coronary Arteries:  Normal coronary origin.  Right dominance.  RCA is a large dominant artery that gives rise to PDA and PLA. Noncalcified plaque in proximal RCA causes minimal (0-24%) stenosis  Left main is a large artery that gives rise to LAD and LCX arteries.  LAD is a large vessel that has no plaque.  LCX is a non-dominant artery that gives rise to one large OM1 branch. There is no  plaque.  Other findings:  Left Ventricle: Normal size  Left Atrium: Mild enlargement.  PFO  Pulmonary Veins: Normal configuration  Right Ventricle: Normal size  Right Atrium: Normal size  Thoracic aorta: Normal size  Pulmonary Arteries: Normal size  Systemic Veins: Normal drainage  Pericardium: Normal thickness  IMPRESSION: 1. Coronary calcium score of 0.  2. Normal coronary origin with right dominance.  3. Nonobstructive CAD  4. Noncalcified plaque in proximal RCA causes minimal (0-24%) stenosis  CAD-RADS 1. Minimal non-obstructive CAD (0-24%). Consider non-atherosclerotic causes of chest pain. Consider preventive therapy and risk factor modification.  Electronically Signed: By: Epifanio Lesches M.D. On: 03/01/2022 15:57          Risk Assessment/Calculations:   {Does this patient have ATRIAL FIBRILLATION?:[phone]}        Lab Results  Component Value Date   WBC 6.3 08/18/2021   HGB 13.7 08/18/2021   HCT 41.9 08/18/2021   MCV 92.7 08/18/2021   PLT 245.0 08/18/2021   Lab Results  Component Value Date   CREATININE 0.83 02/19/2022   BUN 15 02/19/2022   NA 140 02/19/2022   K 4.8 02/19/2022   CL 103 02/19/2022   CO2 25 02/19/2022   Lab Results  Component Value Date   ALT 11 03/17/2022   AST 12 03/17/2022   ALKPHOS 56 03/17/2022   BILITOT 0.5 03/17/2022   Lab Results  Component Value Date   CHOL 153 03/17/2022   HDL 53.80 03/17/2022   LDLCALC 72 03/17/2022   LDLDIRECT 110.0 12/16/2021   TRIG 138.0 03/17/2022   CHOLHDL 3 03/17/2022    Lab Results  Component Value Date   HGBA1C 6.4 03/17/2022     Assessment & Plan    1.  Nonobstructive CAD: -s/p coronary CTA with calcium score of 0 and 0-24% noncalcified plaque in RCA -Patient reports*** -Continue primary prevention with***  2.  Essential hypertension: -Patient's blood pressure today was*** -Continue***  3.  Hyperlipidemia: -Patient's LDL cholesterol  was*** -Continue***  4.  Aortic atherosclerosis: -Continue GDMT with***      Disposition: Follow-up with Christell Constant, MD or APP in *** months {Are you ordering a CV Procedure (e.g. stress test, cath, DCCV, TEE, etc)?   Press F2        :[id number]}  Medication Adjustments/Labs and Tests Ordered: Current medicines are reviewed at length with the patient today.  Concerns regarding medicines are outlined above.   Signed, Napoleon Form, Leodis Rains, NP 06/20/2022, 5:49 PM [facility]

## 2022-06-21 ENCOUNTER — Ambulatory Visit: Payer: No Typology Code available for payment source | Attending: Nurse Practitioner | Admitting: Nurse Practitioner

## 2022-06-21 DIAGNOSIS — I10 Essential (primary) hypertension: Secondary | ICD-10-CM

## 2022-06-21 DIAGNOSIS — I700 Atherosclerosis of aorta: Secondary | ICD-10-CM

## 2022-06-21 DIAGNOSIS — I2510 Atherosclerotic heart disease of native coronary artery without angina pectoris: Secondary | ICD-10-CM

## 2022-06-21 DIAGNOSIS — R072 Precordial pain: Secondary | ICD-10-CM

## 2022-06-21 DIAGNOSIS — E1169 Type 2 diabetes mellitus with other specified complication: Secondary | ICD-10-CM

## 2022-06-24 ENCOUNTER — Other Ambulatory Visit: Payer: Self-pay | Admitting: Nurse Practitioner

## 2022-06-24 DIAGNOSIS — I700 Atherosclerosis of aorta: Secondary | ICD-10-CM

## 2022-06-24 DIAGNOSIS — E1169 Type 2 diabetes mellitus with other specified complication: Secondary | ICD-10-CM

## 2022-06-28 DIAGNOSIS — H903 Sensorineural hearing loss, bilateral: Secondary | ICD-10-CM | POA: Diagnosis not present

## 2022-07-02 DIAGNOSIS — L821 Other seborrheic keratosis: Secondary | ICD-10-CM | POA: Diagnosis not present

## 2022-07-02 DIAGNOSIS — L918 Other hypertrophic disorders of the skin: Secondary | ICD-10-CM | POA: Diagnosis not present

## 2022-07-02 DIAGNOSIS — L570 Actinic keratosis: Secondary | ICD-10-CM | POA: Diagnosis not present

## 2022-07-09 NOTE — Progress Notes (Signed)
Cardiology Office Note:    Date:  07/12/2022   ID:  Amy Tapia, DOB 06/24/1933, MRN [id number]  PCP:  Anne Ng, NP   [facility] Providers Cardiologist:  Christell Constant, MD    Referring MD: Anne Ng, NP   CC: Secondary prevention.  History of Present Illness:    Amy Tapia is a 89 y.o. female with a hx of Aortic atherosclerosis, HLD, and HTN, prior history of breast cancer (distant) radical mastectomy only .  Saw Dr. Myrtis Ser in 2015. 2023: Had no further CP; after SMD we had planned to reoffer if CP returned. 2024: CCTA minimal non obstructive CAD  Patient notes that she is doing OK.   Since last visit notes her husband is getting multiple back therapies . There are no interval hospital/ED visit.   EKG still shows Sinus bradycardia with 1st HB.  No chest pain or pressure .  No SOB/DOE and no PND/Orthopnea.  No weight gain or leg swelling.  No palpitations or syncope .  Ambulatory blood pressure BP log 116/58.  Family History: The patient's family history includes Arthritis in her sister; Breast cancer in her maternal aunt; Glaucoma in her mother; Heart disease in her brother; Hyperthyroidism in her mother; Rheumatic fever in her sister.  ROS:   Please see the history of present illness.     EKGs/Labs/Other Studies Reviewed:    The following studies were reviewed today:  Cardiac Studies & Procedures          CT SCANS  CT CORONARY MORPH W/CTA COR W/SCORE 03/03/2022  Addendum 03/03/2022  5:15 PM ADDENDUM REPORT: 03/03/2022 17:13  EXAM: OVER-READ INTERPRETATION  CT CHEST  The following report is an over-read performed by radiologist Dr. Curly Shores [facility] on 03/03/2022. This over-read does not include interpretation of cardiac or coronary anatomy or pathology. The coronary CTA interpretation by the cardiologist is attached.  COMPARISON:  None.  FINDINGS: Cardiovascular: Mild cardiomegaly.  Opacification of the pulmonary arteries with suboptimal for evaluation for PE. No large central pulmonary artery filling defects are identified.  Mediastinum/Nodes: No suspicious mediastinal or hilar adenopathy identified. There is a small hiatal hernia.  Lungs/Pleura: Bibasilar linear subsegmental atelectasis or scarring. Lungs are otherwise clear. No pleural effusion or pneumothorax.  Upper Abdomen: No acute abnormality.  Musculoskeletal: Status post right mastectomy. Thoracic degenerative disc disease.  IMPRESSION: 1. Mild cardiomegaly. 2. Small hiatal hernia. 3. Bibasilar subsegmental atelectasis or scarring.   Electronically Signed By: Layla Maw M.D. On: 03/03/2022 17:13  Narrative CLINICAL DATA:  85F with chest pain  EXAM: Cardiac/Coronary CTA  TECHNIQUE: The patient was scanned on a [employer].  FINDINGS: A 100 kV prospective scan was triggered in the descending thoracic aorta at 111 HU's. Axial non-contrast 3 mm slices were carried out through the heart. The data set was analyzed on a dedicated work station and scored using the Agatson method. Gantry rotation speed was 250 msecs and collimation was .6 mm. No beta blockade and 0.8 mg of sl NTG was given. The 3D data set was reconstructed in 5% intervals of the 35-75% of the R-R cycle. Phases were analyzed on a dedicated work station using MPR, MIP and VRT modes. The patient received 100 cc of contrast.  Coronary Arteries:  Normal coronary origin.  Right dominance.  RCA is a large dominant artery that gives rise to PDA and PLA. Noncalcified plaque in proximal RCA causes minimal (0-24%) stenosis  Left main is a  large artery that gives rise to LAD and LCX arteries.  LAD is a large vessel that has no plaque.  LCX is a non-dominant artery that gives rise to one large OM1 branch. There is no plaque.  Other findings:  Left Ventricle: Normal size  Left Atrium: Mild enlargement.   PFO  Pulmonary Veins: Normal configuration  Right Ventricle: Normal size  Right Atrium: Normal size  Thoracic aorta: Normal size  Pulmonary Arteries: Normal size  Systemic Veins: Normal drainage  Pericardium: Normal thickness  IMPRESSION: 1. Coronary calcium score of 0.  2. Normal coronary origin with right dominance.  3. Nonobstructive CAD  4. Noncalcified plaque in proximal RCA causes minimal (0-24%) stenosis  CAD-RADS 1. Minimal non-obstructive CAD (0-24%). Consider non-atherosclerotic causes of chest pain. Consider preventive therapy and risk factor modification.  Electronically Signed: By: Epifanio Lesches M.D. On: 03/01/2022 15:57           Physical Exam:    VS:  BP (!) 140/70   Pulse (!) 54   Ht 5\' 7"  (1.702 m)   Wt 193 lb 6.4 oz (87.7 kg)   SpO2 95%   BMI 30.29 kg/m     Wt Readings from Last 3 Encounters:  07/12/22 193 lb 6.4 oz (87.7 kg)  05/11/22 192 lb (87.1 kg)  03/17/22 191 lb 6.4 oz (86.8 kg)    GEN:  Elderly female no acute distress HEENT: Normal NECK: No JVD CARDIAC: RRR, no murmurs, rubs, gallops RESPIRATORY:  Clear to auscultation without rales, wheezing or rhonchi  ABDOMEN: Soft, non-tender, non-distended MUSCULOSKELETAL:  No edema; No deformity  SKIN: Warm and dry NEUROLOGIC:  Alert and oriented x 3 PSYCHIATRIC:  Normal affect   ASSESSMENT:    1. Nonrheumatic aortic valve insufficiency   2. Essential hypertension   3. Aortic atherosclerosis (HCC)   4. Hyperlipidemia associated with type 2 diabetes mellitus (HCC)   5. Myalgia due to statin   6. Coronary artery disease involving native coronary artery of native heart without angina pectoris   7. White coat syndrome with diagnosis of hypertension     PLAN:    Aortic regurgitation - diastolic hear murmur new from my prior visit; mild AI in 2016 - will get echo  Minimal non obstructive CAD HLD complicated by statin myalgia Aortic atherosclerosis - continue  3X  weekly statin (highest tolerated) - continue ASA - PRN nitro (none attempted); can discontinue  - LDL is near goal; we discussed exercise and coming off fish oil if she would like   HTN - white coat variant - BP log at goal - if fatigue drop atenolol to 25 mg and continue BP log - her PCP has given her rare lisinopril for GP; has used it twice this year  Sinus bradycardia with 1st heart block  - monitor  One year with me or team unless significant AI   Medication Adjustments/Labs and Tests Ordered: Current medicines are reviewed at length with the patient today.  Concerns regarding medicines are outlined above.  Orders Placed This Encounter  Procedures   EKG 12-Lead   ECHOCARDIOGRAM COMPLETE   No orders of the defined types were placed in this encounter.   Patient Instructions  Medication Instructions:  Your physician recommends that you continue on your current medications as directed. Please refer to the Current Medication list given to you today.  *If you need a refill on your cardiac medications before your next appointment, please call your pharmacy*   Lab Work: NONE If you  have labs (blood work) drawn today and your tests are completely normal, you will receive your results only by: MyChart Message (if you have MyChart) OR A paper copy in the mail If you have any lab test that is abnormal or we need to change your treatment, we will call you to review the results.   Testing/Procedures: Your physician has requested that you have an echocardiogram. Echocardiography is a painless test that uses sound waves to create images of your heart. It provides your doctor with information about the size and shape of your heart and how well your heart's chambers and valves are working. This procedure takes approximately one hour. There are no restrictions for this procedure. Please do NOT wear cologne, perfume, aftershave, or lotions (deodorant is allowed). Please arrive 15  minutes prior to your appointment time.    Follow-Up: At [facility], you and your health needs are our priority.  As part of our continuing mission to provide you with exceptional heart care, we have created designated Provider Care Teams.  These Care Teams include your primary Cardiologist (physician) and Advanced Practice Providers (APPs -  Physician Assistants and Nurse Practitioners) who all work together to provide you with the care you need, when you need it.  We recommend signing up for the patient portal called "MyChart".  Sign up information is provided on this After Visit Summary.  MyChart is used to connect with patients for Virtual Visits (Telemedicine).  Patients are able to view lab/test results, encounter notes, upcoming appointments, etc.  Non-urgent messages can be sent to your provider as well.   To learn more about what you can do with MyChart, go to [email or web address].    Your next appointment:   1 year(s)  Provider:   Riley Lam, MD       Signed, Christell Constant, MD  07/12/2022 9:18 AM     [facility]

## 2022-07-12 ENCOUNTER — Encounter: Payer: Self-pay | Admitting: Internal Medicine

## 2022-07-12 ENCOUNTER — Ambulatory Visit: Payer: No Typology Code available for payment source | Attending: Nurse Practitioner | Admitting: Internal Medicine

## 2022-07-12 VITALS — BP 140/70 | HR 54 | Ht 67.0 in | Wt 193.4 lb

## 2022-07-12 DIAGNOSIS — T466X5D Adverse effect of antihyperlipidemic and antiarteriosclerotic drugs, subsequent encounter: Secondary | ICD-10-CM

## 2022-07-12 DIAGNOSIS — I10 Essential (primary) hypertension: Secondary | ICD-10-CM

## 2022-07-12 DIAGNOSIS — E1169 Type 2 diabetes mellitus with other specified complication: Secondary | ICD-10-CM | POA: Diagnosis not present

## 2022-07-12 DIAGNOSIS — I700 Atherosclerosis of aorta: Secondary | ICD-10-CM | POA: Diagnosis not present

## 2022-07-12 DIAGNOSIS — E785 Hyperlipidemia, unspecified: Secondary | ICD-10-CM

## 2022-07-12 DIAGNOSIS — I2510 Atherosclerotic heart disease of native coronary artery without angina pectoris: Secondary | ICD-10-CM | POA: Insufficient documentation

## 2022-07-12 DIAGNOSIS — I351 Nonrheumatic aortic (valve) insufficiency: Secondary | ICD-10-CM | POA: Insufficient documentation

## 2022-07-12 DIAGNOSIS — M791 Myalgia, unspecified site: Secondary | ICD-10-CM

## 2022-07-12 NOTE — Patient Instructions (Signed)
Medication Instructions:  Your physician recommends that you continue on your current medications as directed. Please refer to the Current Medication list given to you today.  *If you need a refill on your cardiac medications before your next appointment, please call your pharmacy*   Lab Work: NONE If you have labs (blood work) drawn today and your tests are completely normal, you will receive your results only by: MyChart Message (if you have MyChart) OR A paper copy in the mail If you have any lab test that is abnormal or we need to change your treatment, we will call you to review the results.   Testing/Procedures: Your physician has requested that you have an echocardiogram. Echocardiography is a painless test that uses sound waves to create images of your heart. It provides your doctor with information about the size and shape of your heart and how well your heart's chambers and valves are working. This procedure takes approximately one hour. There are no restrictions for this procedure. Please do NOT wear cologne, perfume, aftershave, or lotions (deodorant is allowed). Please arrive 15 minutes prior to your appointment time.    Follow-Up: At [facility], you and your health needs are our priority.  As part of our continuing mission to provide you with exceptional heart care, we have created designated Provider Care Teams.  These Care Teams include your primary Cardiologist (physician) and Advanced Practice Providers (APPs -  Physician Assistants and Nurse Practitioners) who all work together to provide you with the care you need, when you need it.  We recommend signing up for the patient portal called "MyChart".  Sign up information is provided on this After Visit Summary.  MyChart is used to connect with patients for Virtual Visits (Telemedicine).  Patients are able to view lab/test results, encounter notes, upcoming appointments, etc.  Non-urgent messages can be sent to your  provider as well.   To learn more about what you can do with MyChart, go to [email or web address].    Your next appointment:   1 year(s)  Provider:   Mahesh Chandrasekhar, MD     

## 2022-07-16 ENCOUNTER — Ambulatory Visit: Payer: No Typology Code available for payment source

## 2022-07-16 ENCOUNTER — Ambulatory Visit
Admission: RE | Admit: 2022-07-16 | Discharge: 2022-07-16 | Disposition: A | Discharge status: Home / Self Care | Payer: No Typology Code available for payment source | Source: Ambulatory Visit | Attending: Nurse Practitioner | Admitting: Nurse Practitioner

## 2022-07-16 DIAGNOSIS — Z1231 Encounter for screening mammogram for malignant neoplasm of breast: Secondary | ICD-10-CM | POA: Diagnosis not present

## 2022-07-16 HISTORY — DX: Malignant neoplasm of unspecified site of unspecified female breast: C50.919

## 2022-07-20 DIAGNOSIS — H903 Sensorineural hearing loss, bilateral: Secondary | ICD-10-CM | POA: Diagnosis not present

## 2022-07-26 ENCOUNTER — Telehealth: Payer: Self-pay

## 2022-07-26 NOTE — Telephone Encounter (Signed)
Left msg with husband regarding scheduling AWV

## 2022-07-30 ENCOUNTER — Other Ambulatory Visit: Payer: Self-pay | Admitting: Nurse Practitioner

## 2022-08-03 DIAGNOSIS — H903 Sensorineural hearing loss, bilateral: Secondary | ICD-10-CM | POA: Diagnosis not present

## 2022-08-10 ENCOUNTER — Ambulatory Visit (HOSPITAL_COMMUNITY): Payer: No Typology Code available for payment source | Attending: Internal Medicine

## 2022-08-10 DIAGNOSIS — I10 Essential (primary) hypertension: Secondary | ICD-10-CM | POA: Diagnosis not present

## 2022-08-10 DIAGNOSIS — I351 Nonrheumatic aortic (valve) insufficiency: Secondary | ICD-10-CM | POA: Diagnosis not present

## 2022-08-10 LAB — ECHOCARDIOGRAM COMPLETE
Area-P 1/2: 3.91 cm2
P 1/2 time: 585 msec
S' Lateral: 3 cm

## 2022-08-12 ENCOUNTER — Telehealth: Payer: Self-pay | Admitting: Internal Medicine

## 2022-08-12 NOTE — Telephone Encounter (Signed)
The patient has been notified of the result and verbalized understanding.  All questions (if any) were answered. Macie Burows, RN 08/12/2022 5:06 PM

## 2022-08-12 NOTE — Telephone Encounter (Signed)
Patient returned RN's call. 

## 2022-08-23 ENCOUNTER — Other Ambulatory Visit: Payer: Self-pay | Admitting: Nurse Practitioner

## 2022-09-22 ENCOUNTER — Encounter: Payer: Self-pay | Admitting: Nurse Practitioner

## 2022-09-22 ENCOUNTER — Ambulatory Visit: Payer: No Typology Code available for payment source | Admitting: Nurse Practitioner

## 2022-09-22 VITALS — BP 122/74 | HR 58 | Temp 97.4°F | Ht 67.0 in | Wt 193.6 lb

## 2022-09-22 DIAGNOSIS — E785 Hyperlipidemia, unspecified: Secondary | ICD-10-CM | POA: Diagnosis not present

## 2022-09-22 DIAGNOSIS — E039 Hypothyroidism, unspecified: Secondary | ICD-10-CM | POA: Diagnosis not present

## 2022-09-22 DIAGNOSIS — I700 Atherosclerosis of aorta: Secondary | ICD-10-CM | POA: Diagnosis not present

## 2022-09-22 DIAGNOSIS — Z23 Encounter for immunization: Secondary | ICD-10-CM | POA: Diagnosis not present

## 2022-09-22 DIAGNOSIS — E1169 Type 2 diabetes mellitus with other specified complication: Secondary | ICD-10-CM

## 2022-09-22 LAB — RENAL FUNCTION PANEL
Albumin: 3.8 g/dL (ref 3.5–5.2)
BUN: 19 mg/dL (ref 6–23)
CO2: 25 meq/L (ref 19–32)
Calcium: 9 mg/dL (ref 8.4–10.5)
Chloride: 108 meq/L (ref 96–112)
Creatinine, Ser: 0.8 mg/dL (ref 0.40–1.20)
GFR: 66.56 mL/min (ref >60.00)
Glucose, Bld: 117 mg/dL — ABNORMAL HIGH (ref 70–99)
Phosphorus: 3.6 mg/dL (ref 2.3–4.6)
Potassium: 4.5 meq/L (ref 3.5–5.1)
Sodium: 140 meq/L (ref 135–145)

## 2022-09-22 LAB — HEMOGLOBIN A1C: Hgb A1c MFr Bld: 6.4 % (ref 4.6–6.5)

## 2022-09-22 LAB — T4, FREE: Free T4: 0.97 ng/dL (ref 0.60–1.60)

## 2022-09-22 LAB — TSH: TSH: 3.31 u[IU]/mL (ref 0.35–5.50)

## 2022-09-22 MED ORDER — ROSUVASTATIN CALCIUM 5 MG PO TABS
5.0000 mg | ORAL_TABLET | ORAL | 3 refills | Status: DC
Start: 2022-09-22 — End: 2023-06-08

## 2022-09-22 NOTE — Assessment & Plan Note (Signed)
LDL at goal with crestor 5mg  3x/week Med refill sent

## 2022-09-22 NOTE — Progress Notes (Signed)
Established Patient Visit  Patient: Amy Tapia   DOB: 10-20-1933   89 y.o. Female  MRN: [id number] Visit Date: 09/22/2022  Subjective:    Chief Complaint  Patient presents with   Follow-up    6 month f/u   Medication Management    Would like refills on her medication and would like 90 supply on cholesterol meds not 30   HPI Hypothyroid Asymptomatic Repeat TSH and t4  DM (diabetes mellitus) (HCC) Diet controlled No retinopathy or nephropathy Repeat hgbA1c and BMP today  Hyperlipidemia associated with type 2 diabetes mellitus (HCC) LDL at goal with crestor 5mg  3x/week Med refill sent  Wt Readings from Last 3 Encounters:  09/22/22 193 lb 9.6 oz (87.8 kg)  07/12/22 193 lb 6.4 oz (87.7 kg)  05/11/22 192 lb (87.1 kg)    Reviewed medical, surgical, and social history today  Medications: Outpatient Medications Prior to Visit  Medication Sig   Ascorbic Acid (VITAMIN C) 100 MG tablet Take 100 mg by mouth daily.   aspirin 81 MG tablet Take 81 mg by mouth daily.   atenolol (TENORMIN) 50 MG tablet TAKE 1 TABLET BY MOUTH 2 TIMES A DAY   cholecalciferol (VITAMIN D) 1000 UNITS tablet Take 1,000 Units by mouth daily.   cyclobenzaprine (FLEXERIL) 10 MG tablet Take 10 mg by mouth daily as needed.   Docosahexaenoic Acid (ALGAL OMEGA-3 DHA PO) Take by mouth.   Fish Oil-Cholecalciferol (FISH OIL + D3 PO) Take 1,000 mg by mouth daily.   glucosamine-chondroitin 500-400 MG tablet Take by mouth.   latanoprost (XALATAN) 0.005 % ophthalmic solution Place 1 drop into both eyes at bedtime.   lisinopril (ZESTRIL) 5 MG tablet Take 5 mg by mouth as needed. Only if bp is over 140   nitroGLYCERIN (NITROSTAT) 0.4 MG SL tablet Place 1 tablet (0.4 mg total) under the tongue every 5 (five) minutes as needed for chest pain. No more than 3tabs in 24hrs. Call 911 if pain does not improve   vitamin E 100 UNIT capsule Take 100 Units by mouth daily.   zolpidem (AMBIEN) 5 MG tablet TAKE 1 TABLET  BY MOUTH AT BEDTIME AS NEEDED   [DISCONTINUED] rosuvastatin (CRESTOR) 5 MG tablet TAKE 1 TABLET BY MOUTH 3 TIMES PER WEEK FOR CHOLESTEROL   No facility-administered medications prior to visit.   Reviewed past medical and social history.   ROS per HPI above      Objective:  BP 122/74   Pulse (!) 58   Temp (!) 97.4 F (36.3 C) (Temporal)   Ht 5\' 7"  (1.702 m)   Wt 193 lb 9.6 oz (87.8 kg)   SpO2 96%   BMI 30.32 kg/m      Physical Exam Neck:     Thyroid: No thyroid mass, thyromegaly or thyroid tenderness.  Cardiovascular:     Rate and Rhythm: Normal rate and regular rhythm.     Pulses: Normal pulses.     Heart sounds: Normal heart sounds.  Pulmonary:     Effort: Pulmonary effort is normal.     Breath sounds: Normal breath sounds.  Musculoskeletal:     Cervical back: Normal range of motion and neck supple.     Right lower leg: No edema.     Left lower leg: No edema.  Lymphadenopathy:     Cervical: No cervical adenopathy.  Neurological:     Mental Status: She is alert and oriented  to person, place, and time.     No results found for any visits on 09/22/22.    Assessment & Plan:    Problem List Items Addressed This Visit     Aortic atherosclerosis (HCC)   Relevant Medications   rosuvastatin (CRESTOR) 5 MG tablet   DM (diabetes mellitus) (HCC)    Diet controlled No retinopathy or nephropathy Repeat hgbA1c and BMP today      Relevant Medications   rosuvastatin (CRESTOR) 5 MG tablet   Other Relevant Orders   Hemoglobin A1c   Renal Function Panel   Hyperlipidemia associated with type 2 diabetes mellitus (HCC)    LDL at goal with crestor 5mg  3x/week Med refill sent      Relevant Medications   rosuvastatin (CRESTOR) 5 MG tablet   Hypothyroid    Asymptomatic Repeat TSH and t4      Relevant Orders   T4, free   TSH   Other Visit Diagnoses     Need for influenza vaccination    -  Primary   Relevant Orders   Flu Vaccine Trivalent High Dose (Fluad)  (Completed)      Return in about 6 months (around 03/22/2023) for HTN, DM, hyperlipidemia (fasting).     Alysia Penna, NP

## 2022-09-22 NOTE — Assessment & Plan Note (Signed)
Diet controlled No retinopathy or nephropathy Repeat hgbA1c and BMP today

## 2022-09-22 NOTE — Patient Instructions (Signed)
Go to lab Maintain current meds 

## 2022-09-22 NOTE — Assessment & Plan Note (Signed)
Asymptomatic Repeat TSH and t4

## 2022-09-27 ENCOUNTER — Ambulatory Visit (INDEPENDENT_AMBULATORY_CARE_PROVIDER_SITE_OTHER): Payer: No Typology Code available for payment source

## 2022-09-27 VITALS — BP 124/70 | HR 60 | Temp 97.7°F | Ht 67.5 in | Wt 192.4 lb

## 2022-09-27 DIAGNOSIS — Z0000 Encounter for general adult medical examination without abnormal findings: Secondary | ICD-10-CM

## 2022-09-27 NOTE — Patient Instructions (Signed)
Ms. Ravindran , Thank you for taking time to come for your Medicare Wellness Visit. I appreciate your ongoing commitment to your health goals. Please review the following plan we discussed and let me know if I can assist you in the future.   Referrals/Orders/Follow-Ups/Clinician Recommendations: none  This is a list of the screening recommended for you and due dates:  Health Maintenance  Topic Date Due   Zoster (Shingles) Vaccine (1 of 2) Never done   COVID-19 Vaccine (3 - 2023-24 season) 09/12/2022   Complete foot exam   03/17/2023   Hemoglobin A1C  03/22/2023   Eye exam for diabetics  05/27/2023   Medicare Annual Wellness Visit  09/27/2023   Pneumonia Vaccine  Completed   Flu Shot  Completed   DEXA scan (bone density measurement)  Completed   HPV Vaccine  Aged Out   DTaP/Tdap/Td vaccine  Discontinued    Advanced directives: (Declined) Advance directive discussed with you today. Even though you declined this today, please call our office should you change your mind, and we can give you the proper paperwork for you to fill out.  Next Medicare Annual Wellness Visit scheduled for next year: Yes  Insert Preventive Care attachment Insert FALL PREVENTION attachment if needed

## 2022-09-27 NOTE — Progress Notes (Signed)
Subjective:   Amy Tapia is a 89 y.o. female who presents for Medicare Annual (Subsequent) preventive examination.  Visit Complete: In person    Cardiac Risk Factors include: advanced age (>47men, >54 women);diabetes mellitus;dyslipidemia     Objective:    Today's Vitals   09/27/22 0911  BP: 124/70  Pulse: 60  Temp: 97.7 F (36.5 C)  TempSrc: Oral  SpO2: 97%  Weight: 192 lb 6.4 oz (87.3 kg)  Height: 5' 7.5" (1.715 m)   Body mass index is 29.69 kg/m.     09/27/2022    9:21 AM 04/21/2021   11:18 AM 03/02/2021   12:03 PM 10/30/2018   10:32 PM 09/22/2016   11:52 AM 05/03/2014   12:24 PM 05/01/2014    6:52 PM  Advanced Directives  Does Patient Have a Medical Advance Directive? No No No No No No No  Does patient want to make changes to medical advance directive?  No - Patient declined       Would patient like information on creating a medical advance directive? No - Patient declined  Yes (MAU/Ambulatory/Procedural Areas - Information given)   No - patient declined information No - patient declined information    Current Medications (verified) Outpatient Encounter Medications as of 09/27/2022  Medication Sig   Ascorbic Acid (VITAMIN C) 100 MG tablet Take 100 mg by mouth daily.   aspirin 81 MG tablet Take 81 mg by mouth daily.   atenolol (TENORMIN) 50 MG tablet TAKE 1 TABLET BY MOUTH 2 TIMES A DAY   cholecalciferol (VITAMIN D) 1000 UNITS tablet Take 1,000 Units by mouth daily.   cyclobenzaprine (FLEXERIL) 10 MG tablet Take 10 mg by mouth daily as needed.   Fish Oil-Cholecalciferol (FISH OIL + D3 PO) Take 1,000 mg by mouth daily.   glucosamine-chondroitin 500-400 MG tablet Take by mouth.   latanoprost (XALATAN) 0.005 % ophthalmic solution Place 1 drop into both eyes at bedtime.   lisinopril (ZESTRIL) 5 MG tablet Take 5 mg by mouth as needed. Only if bp is over 140   nitroGLYCERIN (NITROSTAT) 0.4 MG SL tablet Place 1 tablet (0.4 mg total) under the tongue every 5 (five)  minutes as needed for chest pain. No more than 3tabs in 24hrs. Call 911 if pain does not improve   rosuvastatin (CRESTOR) 5 MG tablet Take 1 tablet (5 mg total) by mouth 3 (three) times a week.   vitamin E 100 UNIT capsule Take 100 Units by mouth daily.   zolpidem (AMBIEN) 5 MG tablet TAKE 1 TABLET BY MOUTH AT BEDTIME AS NEEDED   Docosahexaenoic Acid (ALGAL OMEGA-3 DHA PO) Take by mouth. (Patient not taking: Reported on 09/27/2022)   No facility-administered encounter medications on file as of 09/27/2022.    Allergies (verified) Penicillins, Sular [nisoldipine], and Amlodipine besylate   History: Past Medical History:  Diagnosis Date   Arthritis    "maybe a little bit in my knees" (05/02/2014)   Breast cancer (HCC)    Breast cancer, right breast (HCC)    Glaucoma of both eyes    HLD (hyperlipidemia)    Hypertension    Past Surgical History:  Procedure Laterality Date   ABDOMINAL HYSTERECTOMY  09/1971   BREAST BIOPSY Right 08/1970   CATARACT EXTRACTION W/ INTRAOCULAR LENS IMPLANT Right 04/2013   MASTECTOMY Right 08/1970   TONSILLECTOMY     TYMPANOPLASTY Bilateral ~ 1610   "had eardrums patched w/vein from my arms"   Family History  Problem Relation Age of Onset  Glaucoma Mother    Hyperthyroidism Mother    Heart disease Brother    Rheumatic fever Sister    Arthritis Sister    Breast cancer Maternal Aunt        in 14's   Social History   Socioeconomic History   Marital status: Married    Spouse name: Not on file   Number of children: Not on file   Years of education: Not on file   Highest education level: Not on file  Occupational History   Not on file  Tobacco Use   Smoking status: Never   Smokeless tobacco: Never  Vaping Use   Vaping status: Never Used  Substance and Sexual Activity   Alcohol use: No   Drug use: No   Sexual activity: Not Currently  Other Topics Concern   Not on file  Social History Narrative   Not on file   Social Determinants of Health    Financial Resource Strain: Low Risk  (09/27/2022)   Overall Financial Resource Strain (CARDIA)    Difficulty of Paying Living Expenses: Not hard at all  Food Insecurity: No Food Insecurity (09/27/2022)   Hunger Vital Sign    Worried About Running Out of Food in the Last Year: Never true    Ran Out of Food in the Last Year: Never true  Transportation Needs: No Transportation Needs (09/27/2022)   PRAPARE - Administrator, Civil Service (Medical): No    Lack of Transportation (Non-Medical): No  Physical Activity: Inactive (09/27/2022)   Exercise Vital Sign    Days of Exercise per Week: 0 days    Minutes of Exercise per Session: 0 min  Stress: No Stress Concern Present (09/27/2022)   [employer] of Occupational Health - Occupational Stress Questionnaire    Feeling of Stress : Not at all  Social Connections: Socially Integrated (09/27/2022)   Social Connection and Isolation Panel [NHANES]    Frequency of Communication with Friends and Family: More than three times a week    Frequency of Social Gatherings with Friends and Family: Twice a week    Attends Religious Services: More than 4 times per year    Active Member of [employer] or Organizations: Yes    Attends Engineer, structural: More than 4 times per year    Marital Status: Married    Tobacco Counseling Counseling given: Not Answered   Clinical Intake:  Pre-visit preparation completed: Yes  Pain : No/denies pain     Nutritional Status: BMI 25 -29 Overweight Nutritional Risks: None Diabetes: Yes CBG done?: No Did pt. bring in CBG monitor from home?: No  How often do you need to have someone help you when you read instructions, pamphlets, or other written materials from your doctor or pharmacy?: 1 - Never  Interpreter Needed?: No  Information entered by :: NAllen LPN   Activities of Daily Living    09/27/2022    9:13 AM  In your present state of health, do you have any difficulty performing the  following activities:  Hearing? 1  Comment has hearing aids  Vision? 0  Difficulty concentrating or making decisions? 0  Walking or climbing stairs? 0  Dressing or bathing? 0  Doing errands, shopping? 0  Preparing Food and eating ? N  Using the Toilet? N  In the past six months, have you accidently leaked urine? Y  Comment wears a pad  Do you have problems with loss of bowel control? N  Managing your Medications?  N  Managing your Finances? N  Housekeeping or managing your Housekeeping? N    Patient Care Team: Nche, Bonna Gains, NP as PCP - General (Internal Medicine) Christell Constant, MD as PCP - Cardiology (Cardiology) Mateo Flow, MD as Consulting Physician (Ophthalmology)  Indicate any recent Medical Services you may have received from other than [facility] providers in the past year (date may be approximate).     Assessment:   This is a routine wellness examination for Kwanita.  Hearing/Vision screen Hearing Screening - Comments:: Has hearing aids that are maintained Vision Screening - Comments:: Regular eye exams, [facility]   Goals Addressed             This Visit's Progress    Patient Stated       09/27/2022, wants to lose weight       Depression Screen    09/27/2022    9:24 AM 09/22/2022    9:18 AM 03/17/2022   10:15 AM 12/16/2021   11:57 AM 04/21/2021   11:19 AM 04/21/2021   11:17 AM 02/23/2021    1:12 PM  PHQ 2/9 Scores  PHQ - 2 Score 0 0 0 0 0 0 0  PHQ- 9 Score 0 1 3 2        Fall Risk    09/27/2022    9:23 AM 09/22/2022    9:18 AM 03/17/2022    9:33 AM 04/21/2021   11:19 AM 02/23/2021    1:14 PM  Fall Risk   Falls in the past year? 0 0 0 0 0  Number falls in past yr: 0 0 0 0 0  Injury with Fall? 0 0 0 0 0  Risk for fall due to : Medication side effect No Fall Risks No Fall Risks  No Fall Risks  Follow up Falls prevention discussed;Falls evaluation completed Falls evaluation completed Falls evaluation completed Falls evaluation completed  Falls evaluation completed    MEDICARE RISK AT HOME: Medicare Risk at Home Any stairs in or around the home?: Yes If so, are there any without handrails?: No Home free of loose throw rugs in walkways, pet beds, electrical cords, etc?: Yes Adequate lighting in your home to reduce risk of falls?: Yes Life alert?: No Use of a cane, walker or w/c?: No Grab bars in the bathroom?: Yes Shower chair or bench in shower?: Yes Elevated toilet seat or a handicapped toilet?: Yes  TIMED UP AND GO:  Was the test performed?  Yes  Length of time to ambulate 10 feet: 5 sec Gait steady and fast without use of assistive device    Cognitive Function:        09/27/2022    9:25 AM  6CIT Screen  What Year? 0 points  What month? 0 points  What time? 0 points  Count back from 20 0 points  Months in reverse 0 points  Repeat phrase 2 points  Total Score 2 points    Immunizations Immunization History  Administered Date(s) Administered   Fluad Quad(high Dose 65+) 09/12/2018, 12/16/2021   Fluad Trivalent(High Dose 65+) 09/22/2022   Influenza, High Dose Seasonal PF 10/31/2014, 12/02/2015, 11/11/2016, 09/12/2018   Influenza, Quadrivalent, Recombinant, Inj, Pf 11/24/2017, 11/03/2018   Influenza,inj,Quad PF,6+ Mos 10/11/2013   Influenza-Unspecified 10/11/2013, 10/11/2017   PFIZER(Purple Top)SARS-COV-2 Vaccination 03/11/2019, 04/04/2019   Pneumococcal Conjugate-13 05/19/2017   Pneumococcal Polysaccharide-23 01/03/2007    TDAP status: Up to date  Flu Vaccine status: Up to date  Pneumococcal vaccine status: Up to date  Covid-19 vaccine status: Information provided on how to obtain vaccines.   Qualifies for Shingles Vaccine? Yes   Zostavax completed No   Shingrix Completed?: No.    Education has been provided regarding the importance of this vaccine. Patient has been advised to call insurance company to determine out of pocket expense if they have not yet received this vaccine. Advised may also  receive vaccine at local pharmacy or Health Dept. Verbalized acceptance and understanding.  Screening Tests Health Maintenance  Topic Date Due   Zoster Vaccines- Shingrix (1 of 2) Never done   COVID-19 Vaccine (3 - 2023-24 season) 09/12/2022   FOOT EXAM  03/17/2023   HEMOGLOBIN A1C  03/22/2023   OPHTHALMOLOGY EXAM  05/27/2023   Medicare Annual Wellness (AWV)  09/27/2023   Pneumonia Vaccine 39+ Years old  Completed   INFLUENZA VACCINE  Completed   DEXA SCAN  Completed   HPV VACCINES  Aged Out   DTaP/Tdap/Td  Discontinued    Health Maintenance  Health Maintenance Due  Topic Date Due   Zoster Vaccines- Shingrix (1 of 2) Never done   COVID-19 Vaccine (3 - 2023-24 season) 09/12/2022    Colorectal cancer screening: No longer required.   Mammogram status: Completed 07/16/2022. Repeat every year  Bone Density status: Completed 06/09/2022.   Lung Cancer Screening: (Low Dose CT Chest recommended if Age 51-80 years, 20 pack-year currently smoking OR have quit w/in 15years.) does not qualify.   Lung Cancer Screening Referral: no  Additional Screening:  Hepatitis C Screening: does not qualify;  Vision Screening: Recommended annual ophthalmology exams for early detection of glaucoma and other disorders of the eye. Is the patient up to date with their annual eye exam?  Yes  Who is the provider or what is the name of the office in which the patient attends annual eye exams? [facility] If pt is not established with a provider, would they like to be referred to a provider to establish care? No .   Dental Screening: Recommended annual dental exams for proper oral hygiene  Diabetic Foot Exam: Diabetic Foot Exam: Completed 03/17/2022  Community Resource Referral / Chronic Care Management: CRR required this visit?  No   CCM required this visit?  No     Plan:     I have personally reviewed and noted the following in the patient's chart:   Medical and social history Use of  alcohol, tobacco or illicit drugs  Current medications and supplements including opioid prescriptions. Patient is not currently taking opioid prescriptions. Functional ability and status Nutritional status Physical activity Advanced directives List of other physicians Hospitalizations, surgeries, and ER visits in previous 12 months Vitals Screenings to include cognitive, depression, and falls Referrals and appointments  In addition, I have reviewed and discussed with patient certain preventive protocols, quality metrics, and best practice recommendations. A written personalized care plan for preventive services as well as general preventive health recommendations were provided to patient.     Barb Merino, LPN   1/61/0960   After Visit Summary: in person  Nurse Notes: none

## 2022-11-09 ENCOUNTER — Encounter: Payer: Self-pay | Admitting: Nurse Practitioner

## 2022-11-09 ENCOUNTER — Ambulatory Visit (INDEPENDENT_AMBULATORY_CARE_PROVIDER_SITE_OTHER): Payer: No Typology Code available for payment source | Admitting: Nurse Practitioner

## 2022-11-09 VITALS — BP 138/88 | HR 54 | Temp 97.8°F | Resp 18 | Wt 194.4 lb

## 2022-11-09 DIAGNOSIS — M5441 Lumbago with sciatica, right side: Secondary | ICD-10-CM | POA: Diagnosis not present

## 2022-11-09 MED ORDER — CYCLOBENZAPRINE HCL 5 MG PO TABS
5.0000 mg | ORAL_TABLET | Freq: Every evening | ORAL | 0 refills | Status: DC | PRN
Start: 2022-11-09 — End: 2023-03-14

## 2022-11-09 MED ORDER — KETOROLAC TROMETHAMINE 30 MG/ML IJ SOLN
30.0000 mg | Freq: Once | INTRAMUSCULAR | Status: AC
Start: 2022-11-09 — End: 2022-11-09
  Administered 2022-11-09: 30 mg via INTRAMUSCULAR

## 2022-11-09 MED ORDER — PREDNISONE 10 MG (21) PO TBPK: ORAL_TABLET | ORAL | 0 refills | Status: DC

## 2022-11-09 NOTE — Progress Notes (Signed)
Established Patient Visit  Patient: Amy Tapia   DOB: 28-Mar-1933   89 y.o. Female  MRN: [id number] Visit Date: 11/09/2022  Subjective:    Chief Complaint  Patient presents with   OFFICE VISIT     PT is here for hip and back pain that radiate down leg for 2 weeks. PT took Tylenol and aleve for pain.    Back Pain This is a new problem. The current episode started 1 to 4 weeks ago (2weeks). The problem has been waxing and waning since onset. The pain is present in the lumbar spine. The quality of the pain is described as aching, cramping and shooting. The pain radiates to the right thigh. The pain is moderate. The symptoms are aggravated by bending, standing and twisting. Stiffness is present All day. Pertinent negatives include no abdominal pain, bladder incontinence, bowel incontinence, chest pain, dysuria, fever, headaches, leg pain, numbness, paresis, paresthesias, pelvic pain, perianal numbness, tingling, weakness or weight loss. Risk factors include poor posture, sedentary lifestyle, menopause and lack of exercise. She has tried analgesics and NSAIDs for the symptoms. The treatment provided mild relief.  Denies any fall.  Bone density 2024: The BMD measured at AP Spine L1-L4 is 1.009 g/cm2 with a T-score of -1.4. This patient is considered osteopenic/low bone mass according to World Health Organization [facility]) criteria.  Reviewed medical, surgical, and social history today  Medications: Outpatient Medications Prior to Visit  Medication Sig   Ascorbic Acid (VITAMIN C) 100 MG tablet Take 100 mg by mouth daily.   aspirin 81 MG tablet Take 81 mg by mouth daily.   atenolol (TENORMIN) 50 MG tablet TAKE 1 TABLET BY MOUTH 2 TIMES A DAY   cholecalciferol (VITAMIN D) 1000 UNITS tablet Take 1,000 Units by mouth daily.   Docosahexaenoic Acid (ALGAL OMEGA-3 DHA PO) Take by mouth.   Fish Oil-Cholecalciferol (FISH OIL + D3 PO) Take 1,000 mg by mouth daily.   glucosamine-chondroitin  500-400 MG tablet Take by mouth.   latanoprost (XALATAN) 0.005 % ophthalmic solution Place 1 drop into both eyes at bedtime.   lisinopril (ZESTRIL) 5 MG tablet Take 5 mg by mouth as needed. Only if bp is over 140   nitroGLYCERIN (NITROSTAT) 0.4 MG SL tablet Place 1 tablet (0.4 mg total) under the tongue every 5 (five) minutes as needed for chest pain. No more than 3tabs in 24hrs. Call 911 if pain does not improve   rosuvastatin (CRESTOR) 5 MG tablet Take 1 tablet (5 mg total) by mouth 3 (three) times a week.   vitamin E 100 UNIT capsule Take 100 Units by mouth daily.   zolpidem (AMBIEN) 5 MG tablet TAKE 1 TABLET BY MOUTH AT BEDTIME AS NEEDED   [DISCONTINUED] cyclobenzaprine (FLEXERIL) 10 MG tablet Take 10 mg by mouth daily as needed.   No facility-administered medications prior to visit.   Reviewed past medical and social history.   ROS per HPI above  Last metabolic panel Lab Results  Component Value Date   GLUCOSE 117 (H) 09/22/2022   NA 140 09/22/2022   K 4.5 09/22/2022   CL 108 09/22/2022   CO2 25 09/22/2022   BUN 19 09/22/2022   CREATININE 0.80 09/22/2022   GFR 66.56 09/22/2022   CALCIUM 9.0 09/22/2022   PHOS 3.6 09/22/2022   PROT 6.4 03/17/2022   ALBUMIN 3.8 09/22/2022   BILITOT 0.5 03/17/2022   ALKPHOS 56 03/17/2022  AST 12 03/17/2022   ALT 11 03/17/2022   ANIONGAP 12 10/30/2018   Last hemoglobin A1c Lab Results  Component Value Date   HGBA1C 6.4 09/22/2022      Objective:  BP 138/88 (BP Location: Left Arm, Patient Position: Sitting, Cuff Size: Normal)   Pulse (!) 54   Temp 97.8 F (36.6 C) (Temporal)   Resp 18   Wt 194 lb 6.4 oz (88.2 kg)   SpO2 98%   BMI 30.00 kg/m      Physical Exam Vitals and nursing note reviewed.  Cardiovascular:     Rate and Rhythm: Normal rate.     Pulses: Normal pulses.  Pulmonary:     Effort: Pulmonary effort is normal.  Abdominal:     Palpations: Abdomen is soft.     Tenderness: There is no abdominal tenderness. There  is no right CVA tenderness, left CVA tenderness or guarding.  Musculoskeletal:     Lumbar back: Spasms present. No swelling, tenderness or bony tenderness. Normal range of motion. Negative right straight leg raise test and negative left straight leg raise test.     Right hip: Tenderness present. No bony tenderness or crepitus. Normal range of motion. Normal strength.     Left hip: Normal.     Right upper leg: Normal.     Left upper leg: Normal.     Right lower leg: No edema.     Left lower leg: No edema.  Skin:    Findings: No erythema or rash.  Neurological:     Mental Status: She is alert and oriented to person, place, and time.     No results found for any visits on 11/09/22.    Assessment & Plan:    Problem List Items Addressed This Visit   None Visit Diagnoses     Acute midline low back pain with right-sided sciatica    -  Primary   Relevant Medications   ketorolac (TORADOL) 30 MG/ML injection 30 mg (Completed)   predniSONE (STERAPRED UNI-PAK 21 TAB) 10 MG (21) TBPK tablet   cyclobenzaprine (FLEXERIL) 5 MG tablet     Advised to Do not take ibuprofen or aleve or naproxen or aleve or advil while taking oral prednisone. Ok to take tylenol 650mg  every 8hrs as needed. Take flexeril only at bedtime as needed. Call office if no improvement in 1week  Return if symptoms worsen or fail to improve.     Alysia Penna, NP

## 2022-11-09 NOTE — Patient Instructions (Signed)
Do not take ibuprofen or aleve or naproxen or aleve or advil while taking oral prednisone. Ok to take tylenol 650mg  every 8hrs as needed. Take flexeril only at bedtime as needed. Call office if no improvement in 1week

## 2022-11-18 ENCOUNTER — Other Ambulatory Visit: Payer: Self-pay | Admitting: Nurse Practitioner

## 2022-11-18 NOTE — Telephone Encounter (Signed)
Refill request for  Ambien 5 mg LR 08/01/25, #30, 2 rf LOV  11/09/22 FOV  03/22/23  Please review and advise.  Thanks. Dm/cma

## 2022-12-02 DIAGNOSIS — H401131 Primary open-angle glaucoma, bilateral, mild stage: Secondary | ICD-10-CM | POA: Diagnosis not present

## 2023-02-07 ENCOUNTER — Other Ambulatory Visit: Payer: Self-pay | Admitting: Nurse Practitioner

## 2023-02-11 ENCOUNTER — Other Ambulatory Visit: Payer: Self-pay | Admitting: Nurse Practitioner

## 2023-02-24 ENCOUNTER — Other Ambulatory Visit: Payer: Self-pay | Admitting: Nurse Practitioner

## 2023-02-25 NOTE — Telephone Encounter (Signed)
Requesting: ZOLPIDEM TARTRATE 5 MG TAB  Last Visit: 11/09/2022 Next Visit: 03/22/2023 Last Refill: 11/19/2022  Please Advise

## 2023-03-11 ENCOUNTER — Telehealth: Payer: Self-pay

## 2023-03-11 ENCOUNTER — Ambulatory Visit
Admission: RE | Admit: 2023-03-11 | Discharge: 2023-03-11 | Disposition: A | Discharge status: Home / Self Care | Payer: No Typology Code available for payment source | Source: Ambulatory Visit | Attending: Nurse Practitioner | Admitting: Nurse Practitioner

## 2023-03-11 DIAGNOSIS — M5441 Lumbago with sciatica, right side: Secondary | ICD-10-CM

## 2023-03-11 DIAGNOSIS — M545 Low back pain, unspecified: Secondary | ICD-10-CM | POA: Diagnosis not present

## 2023-03-11 DIAGNOSIS — M47816 Spondylosis without myelopathy or radiculopathy, lumbar region: Secondary | ICD-10-CM | POA: Diagnosis not present

## 2023-03-11 NOTE — Telephone Encounter (Signed)
 Copied from CRM [id number]. Topic: Clinical - Request for Lab/Test Order >> Mar 11, 2023  8:50 AM Theodis Sato wrote: Reason for CRM: Patient is requesting Alysia Penna to please order the X-ray that was discussed during a previous appointment, as her back pain has not subsided at all.   *Patient is requesting a call to schedule this, when and if the xray is ordered*  Spoke to Pt and she is requesting office visit to evaluate severe back pain. LOV was on 11/09/2022 and was taking RX as prescribed; symptoms haven't improved and became worse over time. Pt was scheduled for 03/14/2022 OV with possible request for X-ray.

## 2023-03-11 NOTE — Addendum Note (Signed)
 Addended by: Michaela Corner on: 03/11/2023 12:29 PM   Modules accepted: Orders

## 2023-03-14 ENCOUNTER — Ambulatory Visit (INDEPENDENT_AMBULATORY_CARE_PROVIDER_SITE_OTHER): Payer: No Typology Code available for payment source | Admitting: Nurse Practitioner

## 2023-03-14 ENCOUNTER — Encounter: Payer: Self-pay | Admitting: Nurse Practitioner

## 2023-03-14 VITALS — BP 130/66 | HR 56 | Temp 97.9°F | Ht 67.0 in | Wt 194.8 lb

## 2023-03-14 DIAGNOSIS — M545 Low back pain, unspecified: Secondary | ICD-10-CM | POA: Insufficient documentation

## 2023-03-14 DIAGNOSIS — M5441 Lumbago with sciatica, right side: Secondary | ICD-10-CM

## 2023-03-14 MED ORDER — KETOROLAC TROMETHAMINE 30 MG/ML IJ SOLN
30.0000 mg | Freq: Once | INTRAMUSCULAR | Status: AC
  Administered 2023-03-14: 30 mg via INTRAMUSCULAR

## 2023-03-14 MED ORDER — TIZANIDINE HCL 4 MG PO TABS: 4.0000 mg | ORAL_TABLET | Freq: Two times a day (BID) | ORAL | 0 refills | Status: DC | PRN

## 2023-03-14 MED ORDER — MELOXICAM 7.5 MG PO TABS: 7.5000 mg | ORAL_TABLET | Freq: Every day | ORAL | 0 refills | Status: DC

## 2023-03-14 NOTE — Telephone Encounter (Signed)
 Spoke to pt on 03/11/2023 and relied message below. Pt verbalized understanding and stated she will go have X ray done and maintain appointment on 03/14/2023 to go over results and further plans regarding severe back pain

## 2023-03-14 NOTE — Addendum Note (Signed)
 Addended by: Alysia Penna L on: 03/14/2023 12:36 PM   Modules accepted: Level of Service

## 2023-03-14 NOTE — Progress Notes (Signed)
 Established Patient Visit  Patient: Amy Tapia   DOB: 1933-08-08   89 y.o. Female  MRN: [id number] Visit Date: 03/14/2023  Subjective:    Chief Complaint  Patient presents with   Follow-up    F/u for back pain-still hurting    Back Pain This is a recurrent problem. The current episode started more than 1 month ago. The problem occurs intermittently. The problem has been waxing and waning since onset. The pain is present in the lumbar spine. The quality of the pain is described as cramping and aching. The pain radiates to the right thigh. The pain is moderate. The pain is Worse during the day. The symptoms are aggravated by bending and twisting. Stiffness is present At night. Associated symptoms include leg pain. Pertinent negatives include no abdominal pain, bladder incontinence, bowel incontinence, numbness, paresis, paresthesias, pelvic pain, perianal numbness, tingling or weakness. Risk factors include obesity, poor posture, sedentary lifestyle and lack of exercise. She has tried muscle relaxant and NSAIDs for the symptoms. The treatment provided significant relief.  Had similar pain 3months ago and completing house chores. Pain improved with prednisone dose pack, flexeril and tylenol. She returns today with similar pain x 4days after completing house chores.  Reviewed medical, surgical, and social history today  Medications: Outpatient Medications Prior to Visit  Medication Sig   Ascorbic Acid (VITAMIN C) 100 MG tablet Take 100 mg by mouth daily.   aspirin 81 MG tablet Take 81 mg by mouth daily.   atenolol (TENORMIN) 50 MG tablet TAKE 1 TABLET BY MOUTH 2 TIMES A DAY   cholecalciferol (VITAMIN D) 1000 UNITS tablet Take 1,000 Units by mouth daily.   Docosahexaenoic Acid (ALGAL OMEGA-3 DHA PO) Take by mouth.   Fish Oil-Cholecalciferol (FISH OIL + D3 PO) Take 1,000 mg by mouth daily.   glucosamine-chondroitin 500-400 MG tablet Take by mouth.   latanoprost (XALATAN) 0.005  % ophthalmic solution Place 1 drop into both eyes at bedtime.   lisinopril (ZESTRIL) 5 MG tablet Take 5 mg by mouth as needed. Only if bp is over 140   nitroGLYCERIN (NITROSTAT) 0.4 MG SL tablet Place 1 tablet (0.4 mg total) under the tongue every 5 (five) minutes as needed for chest pain. No more than 3tabs in 24hrs. Call 911 if pain does not improve   rosuvastatin (CRESTOR) 5 MG tablet Take 1 tablet (5 mg total) by mouth 3 (three) times a week.   vitamin E 100 UNIT capsule Take 100 Units by mouth daily.   zolpidem (AMBIEN) 5 MG tablet Take 1 tablet (5 mg total) by mouth at bedtime as needed. Maintain upcoming appointment for addiitonal refills   [DISCONTINUED] cyclobenzaprine (FLEXERIL) 5 MG tablet Take 1 tablet (5 mg total) by mouth at bedtime as needed.   [DISCONTINUED] predniSONE (STERAPRED UNI-PAK 21 TAB) 10 MG (21) TBPK tablet As directed on package   No facility-administered medications prior to visit.   Reviewed past medical and social history.   ROS per HPI above      Objective:  BP 130/66 (BP Location: Left Arm, Patient Position: Sitting, Cuff Size: Normal)   Pulse (!) 56   Temp 97.9 F (36.6 C) (Temporal)   Ht 5\' 7"  (1.702 m)   Wt 194 lb 12.8 oz (88.4 kg)   SpO2 98%   BMI 30.51 kg/m      Physical Exam Vitals and nursing note reviewed.  Cardiovascular:  Rate and Rhythm: Normal rate.     Pulses: Normal pulses.  Pulmonary:     Effort: Pulmonary effort is normal.  Musculoskeletal:     Lumbar back: Spasms and tenderness present. No bony tenderness. Normal range of motion. Negative right straight leg raise test and negative left straight leg raise test.  Neurological:     Mental Status: She is alert and oriented to person, place, and time.     No results found for any visits on 03/14/23.    Assessment & Plan:   Overview of lumbar x-ray completed 03/11/2023: DDD noted. No obvious compression fracture. Waiting for radiology to provide a completed report  Problem  List Items Addressed This Visit   None Visit Diagnoses       Acute midline low back pain with right-sided sciatica    -  Primary   Relevant Medications   tiZANidine (ZANAFLEX) 4 MG tablet   meloxicam (MOBIC) 7.5 MG tablet   ketorolac (TORADOL) 30 MG/ML injection 30 mg (Completed)     Start mobic/meloxicam 7.5mg  tomorrow. Avoid all OVER THE COUNTER NSAIDs: ibuprofen, motrin, advil, aleve, naproxen while taking mobic Ok to take tylenol 650mg  every 8hrs as needed. Do not drive when you take zanaflex. This may cause drowsiness.  Return if symptoms worsen or fail to improve.     Alysia Penna, NP

## 2023-03-14 NOTE — Patient Instructions (Signed)
 Start mobic/meloxicam 7.5mg  tomorrow. Avoid all OVER THE COUNTER NSAIDs: ibuprofen, motrin, advil, aleve, naproxen while taking mobic Ok to take tylenol 650mg  every 8hrs as needed. Do not drive when you take zanaflex. This may cause drowsiness. You will be contacted with x-ray results  Managing Chronic Back Pain Chronic back pain is pain that lasts longer than 3 months. It often affects the lower back. It may feel like a muscle ache or a sharp, stabbing pain. It can be mild, moderate, or severe. There are things you can do to help manage your pain. See what works best for you. Your health care provider may also give you other instructions. What actions can I take to manage my chronic back pain? You may be given a treatment plan by your provider. Treatment often starts with rest and pain relief. It may also include: Physical therapy. These are exercises to help restore movement and strength to your back. Techniques to help you relax. Counseling or therapy. Cognitive behavioral therapy (CBT) is a form of therapy that helps you set goals and make changes. Acupuncture or massage therapy. Local electrical stimulation. Injections. You may be given medicines to numb an area or relieve pain. If other treatments do not help, you may need surgery. How to use body mechanics and posture to help with pain You can help relieve stress on your back with good posture and healthy body mechanics. Body mechanics are all the ways your body moves during the day. Posture is part of body mechanics. Good posture means: Your spine is in its correct S-curve, or neutral, position. Your shoulders are pulled back a bit. Your head is not tipped forward. To improve your posture and body mechanics, follow these guidelines. Standing  When standing, keep your feet about hip-width apart. Keep your knees slightly bent. Your ears, shoulders, and hips should line up. Your spine should be neutral. When you stand in one place for  a long time, place one foot on a stable object that is 2-4 inches (5-10 cm) high, such as a footstool. Sitting  When sitting, keep your feet flat on the floor. Use a footrest, if needed. Keep your thighs parallel to the floor. Try not to round your shoulders or tilt your head forward. When working at a desk or a computer: Position your desk so your hands are a little lower than your elbows. Slide your chair under your desk so you are close enough to have good posture. Position your monitor so you are looking straight ahead and do not have to tilt your head to view the screen. Lifting  Keep your feet shoulder-width apart. Tighten the muscles of your abdomen. Bend your knees and hips. Keep your spine neutral. Lift using the strength of your legs, not your back. Do not lock your knees straight out. Ask for help to lift heavy or awkward objects. Resting  Do not lie down in a way that causes pain. If you have pain when you sit, bend, stoop, or squat, lie in a way that your body does not bend much. Try not to curl up on your side with your arms and knees near your chest (fetal position). If it hurts to stand for a long time or reach with your arms, lie with your spine neutral and knees bent slightly. Try lying: On your side with a pillow between your knees. On your back with a pillow under your knees. How to recognize changes in your chronic back pain Let your provider know if  your pain gets worse or does not get better with treatment. Your back pain may be getting worse if you have pain that: Starts to cause problems with your posture. Gets worse when you sit, stand, walk, bend, or lift things. Happens when you are active, at rest, or both. Makes it hard for you to move around (limits mobility). Occurs with fever, weight loss, or trouble peeing (urinating). Causes numbness and tingling. Follow these instructions at home: Medicines You may need to take medicines for pain and inflammation.  These may be taken by mouth or put on the skin. You may also be given muscle relaxants. Take over-the-counter and prescription medicines only as told by your provider. Ask your provider if the medicine prescribed to you: Requires you to avoid driving or using machinery. Can cause constipation. You may need to take these actions to prevent or treat constipation: Drink enough fluid to keep your pee (urine) pale yellow. Take over-the-counter or prescription medicines. Eat foods that are high in fiber, such as beans, whole grains, and fresh fruits and vegetables. Limit foods that are high in fat and processed sugars, such as fried or sweet foods. Lifestyle Do not use any products that contain nicotine or tobacco. These products include cigarettes, chewing tobacco, and vaping devices, such as e-cigarettes. If you need help quitting, ask your provider. Eat a healthy diet. Eat lots of vegetables, fruits, fish, and lean meats. Work with your provider to stay at a healthy weight. General instructions Get regular exercise as told. Exercise can help with flexibility and strength. If physical therapy was prescribed, do exercises as told by your provider. Use ice or heat therapy as told by your provider. Where can I get support? Think about joining a support group for people with chronic back pain. You can find some groups at: Pain Connection Program: painconnection.org The American Chronic Pain Association: acpanow.com Contact a health care provider if: Your pain does not get better with rest or medicine. You have new pain. You have a fever. You lose weight quickly. You have trouble doing your normal activities. You feel weak or numb in one or both of your legs or feet. Get help right away if: You are not able to control when you pee or poop. You have severe back pain and: Nausea or vomiting. Pain in your chest or abdomen. Shortness of breath. You faint. These symptoms may be an emergency. Get  help right away. Call 911. Do not wait to see if the symptoms will go away. Do not drive yourself to the hospital. This information is not intended to replace advice given to you by your health care provider. Make sure you discuss any questions you have with your health care provider. Document Revised: 08/17/2021 Document Reviewed: 08/17/2021 Elsevier Patient Education  2024 [employer].

## 2023-03-22 ENCOUNTER — Ambulatory Visit: Payer: No Typology Code available for payment source | Admitting: Nurse Practitioner

## 2023-03-24 ENCOUNTER — Other Ambulatory Visit: Payer: Self-pay | Admitting: Nurse Practitioner

## 2023-03-24 ENCOUNTER — Ambulatory Visit: Payer: Self-pay | Admitting: Nurse Practitioner

## 2023-03-24 DIAGNOSIS — I10 Essential (primary) hypertension: Secondary | ICD-10-CM

## 2023-03-24 MED ORDER — LISINOPRIL 10 MG PO TABS: 10.0000 mg | ORAL_TABLET | Freq: Every day | ORAL | 2 refills | Status: DC

## 2023-03-24 NOTE — Telephone Encounter (Unsigned)
 Copied from CRM [id number]. Topic: Clinical - Medication Refill >> Mar 24, 2023 10:14 AM Dimitri Ped wrote: Most Recent Primary Care Visit:  Provider: Anne Ng  Department: [facility]  Visit Type: OFFICE VISIT  Date: 03/14/2023  Medication: lisinopril (ZESTRIL) 5 MG tablet 90 day supply   Has the patient contacted their pharmacy? No have not took in a long time  (Agent: If no, request that the patient contact the pharmacy for the refill. If patient does not wish to contact the pharmacy document the reason why and proceed with request.) (Agent: If yes, when and what did the pharmacy advise?)  Is this the correct pharmacy for this prescription? Yes If no, delete pharmacy and type the correct one.  This is the patient's preferred pharmacy:  [facility] - [place], [place] - [address] [place] [address] Phone: [phone] Fax: [phone]   Has the prescription been filled recently? No  Is the patient out of the medication? Yes  Has the patient been seen for an appointment in the last year OR does the patient have an upcoming appointment? Yes  Can we respond through MyChart? No  Agent: Please be advised that Rx refills may take up to 3 business days. We ask that you follow-up with your pharmacy.

## 2023-03-24 NOTE — Telephone Encounter (Signed)
  Chief Complaint: High blood pressure Symptoms: asymtpomatic Frequency: the last week Pertinent Negatives: Patient denies all symptoms Disposition: [] ED /[] Urgent Care (no appt availability in office) / [x] Appointment(In office/virtual)/ []  [facility]/ [] Home Care/ [x] Refused Recommended Disposition /[] [facility]/ []  Follow-up with PCP Additional Notes: Patient calls stating she has one pill of lisinopril left and her blood pressure has been more elevated than normal over the last week. She reports that this morning it was 150/67, checked during call and received a reading of 177/79 after patient ambulated to another room to check it. Patient denies all symptoms. Per protocol, patient to be evaluated within 3 days. Patient is scheduled to see PCP 3/25 and she is asking for a refill only, declines visit if not necessary. Requesting call back regarding medication or medication adjustment. Care advice reviewed, patient verbalized understandingand denies further questions at this time. Alerting PCP for review and follow up.    Copied from CRM [id number]. Topic: Clinical - Red Word Triage >> Mar 24, 2023 10:16 AM Dimitri Ped wrote: [facility] that prompted transfer to Nurse Triage: patient is calling for a refill request on blood pressure medication . Patient says blood pressure was over 150 . Sent in refill request but need to be triage with blood pressure being high Reason for Disposition  Systolic BP  >= 160 OR Diastolic >= 100  Answer Assessment - Initial Assessment Questions 1. BLOOD PRESSURE: "What is the blood pressure?" "Did you take at least two measurements 5 minutes apart?"     150/67 this morning. During call- 177/79 HR 58- patient had to ambulate to another room to take BP 2. ONSET: "When did you take your blood pressure?"     This morning, during call 3. HOW: "How did you take your blood pressure?" (e.g., automatic home BP monitor, visiting nurse)     Home  monitor 4. HISTORY: "Do you have a history of high blood pressure?"     Yes 5. MEDICINES: "Are you taking any medicines for blood pressure?" "Have you missed any doses recently?"     Atenolol, lisinopril PRN 6. OTHER SYMPTOMS: "Do you have any symptoms?" (e.g., blurred vision, chest pain, difficulty breathing, headache, weakness)     Denies  Protocols used: Blood Pressure - High-A-AH

## 2023-03-25 NOTE — Telephone Encounter (Signed)
 Patient notified VIA phone. And will keep he upcoming appt. Dm/cma

## 2023-04-05 ENCOUNTER — Encounter: Payer: Self-pay | Admitting: Nurse Practitioner

## 2023-04-05 ENCOUNTER — Telehealth: Payer: Self-pay | Admitting: Nurse Practitioner

## 2023-04-05 ENCOUNTER — Ambulatory Visit (INDEPENDENT_AMBULATORY_CARE_PROVIDER_SITE_OTHER): Admitting: Nurse Practitioner

## 2023-04-05 VITALS — BP 140/80 | HR 56 | Temp 97.6°F | Ht 67.0 in | Wt 191.8 lb

## 2023-04-05 DIAGNOSIS — G8929 Other chronic pain: Secondary | ICD-10-CM

## 2023-04-05 DIAGNOSIS — E039 Hypothyroidism, unspecified: Secondary | ICD-10-CM | POA: Diagnosis not present

## 2023-04-05 DIAGNOSIS — I10 Essential (primary) hypertension: Secondary | ICD-10-CM

## 2023-04-05 DIAGNOSIS — E785 Hyperlipidemia, unspecified: Secondary | ICD-10-CM | POA: Diagnosis not present

## 2023-04-05 DIAGNOSIS — M5441 Lumbago with sciatica, right side: Secondary | ICD-10-CM

## 2023-04-05 DIAGNOSIS — M51362 Other intervertebral disc degeneration, lumbar region with discogenic back pain and lower extremity pain: Secondary | ICD-10-CM

## 2023-04-05 DIAGNOSIS — E1169 Type 2 diabetes mellitus with other specified complication: Secondary | ICD-10-CM

## 2023-04-05 LAB — COMPREHENSIVE METABOLIC PANEL
ALT: 14 U/L (ref 0–35)
AST: 17 U/L (ref 0–37)
Albumin: 4.1 g/dL (ref 3.5–5.2)
Alkaline Phosphatase: 48 U/L (ref 39–117)
BUN: 11 mg/dL (ref 6–23)
CO2: 29 meq/L (ref 19–32)
Calcium: 9.4 mg/dL (ref 8.4–10.5)
Chloride: 105 meq/L (ref 96–112)
Creatinine, Ser: 0.78 mg/dL (ref 0.40–1.20)
GFR: 68.35 mL/min (ref >60.00)
Glucose, Bld: 107 mg/dL — ABNORMAL HIGH (ref 70–99)
Potassium: 4.2 meq/L (ref 3.5–5.1)
Sodium: 142 meq/L (ref 135–145)
Total Bilirubin: 0.6 mg/dL (ref 0.2–1.2)
Total Protein: 6.9 g/dL (ref 6.0–8.3)

## 2023-04-05 LAB — LIPID PANEL
Cholesterol: 150 mg/dL (ref 0–200)
HDL: 58.2 mg/dL (ref >39.00)
LDL Cholesterol: 69 mg/dL (ref 0–99)
NonHDL: 91.38
Total CHOL/HDL Ratio: 3
Triglycerides: 113 mg/dL (ref 0.0–149.0)
VLDL: 22.6 mg/dL (ref 0.0–40.0)

## 2023-04-05 LAB — TSH: TSH: 2.65 u[IU]/mL (ref 0.35–5.50)

## 2023-04-05 LAB — HEMOGLOBIN A1C: Hgb A1c MFr Bld: 6.4 % (ref 4.6–6.5)

## 2023-04-05 LAB — MICROALBUMIN / CREATININE URINE RATIO
Creatinine,U: 56.8 mg/dL
Microalb Creat Ratio: UNDETERMINED mg/g (ref 0.0–30.0)
Microalb, Ur: <0.7 mg/dL

## 2023-04-05 LAB — T4, FREE: Free T4: 0.9 ng/dL (ref 0.60–1.60)

## 2023-04-05 NOTE — Assessment & Plan Note (Signed)
 Diet controlled No retinopathy or nephropathy Repeat UACr, hgbA1c and CMP today

## 2023-04-05 NOTE — Telephone Encounter (Signed)
 03/22/2023 same day cancel w/o reason listed, 1st missed visit since 11/2021, letter sent via mail  Pt seen 04/05/2023

## 2023-04-05 NOTE — Assessment & Plan Note (Signed)
 No adverse effects with crestor 5mg  3x/week Repeat lipid panel and CMP

## 2023-04-05 NOTE — Assessment & Plan Note (Addendum)
 Improved back pain with toradol IM x2 (10/2022 and 03/2023), mobic 7.5mg   and zanaflex X-ray lumbar spine:Five lumbar type vertebral bodies are well visualized. Vertebral body height is well maintained. Multilevel osteophytic change and Facet hypertrophic changes noted. No anterolisthesis is seen. No soft tissue abnormality is noted.  Advised about need for proper body mechanic at all time. She agreed to outpatient PT.

## 2023-04-05 NOTE — Progress Notes (Signed)
 Established Patient Visit  Patient: Amy Tapia   DOB: 03/25/1933   89 y.o. Female  MRN: [id number] Visit Date: 04/05/2023  Subjective:    Chief Complaint  Patient presents with   Follow-up    6 month f/u   HPI White coat syndrome with diagnosis of hypertension Home BP in last 71month: 120s-140s/60s Had to take lisinopril dose most days of the week. Has maintained atenolol BID. Denies any high salt diet. Stays active by working in her garden BP Readings from Last 3 Encounters:  04/05/23 (!) 140/80  03/14/23 130/66  11/09/22 138/88    Advised to maintain current med doses Repeat BMP  DM (diabetes mellitus) (HCC) Diet controlled No retinopathy or nephropathy Repeat UACr, hgbA1c and CMP today  Hyperlipidemia associated with type 2 diabetes mellitus (HCC) No adverse effects with crestor 5mg  3x/week Repeat lipid panel and CMP  Chronic right-sided low back pain Improved back pain with toradol IM x2 (10/2022 and 03/2023), mobic 7.5mg   and zanaflex X-ray lumbar spine:Five lumbar type vertebral bodies are well visualized. Vertebral body height is well maintained. Multilevel osteophytic change and Facet hypertrophic changes noted. No anterolisthesis is seen. No soft tissue abnormality is noted.  Advised about need for proper body mechanic at all time. She agreed to outpatient PT.  Reviewed medical, surgical, and social history today  Medications: Outpatient Medications Prior to Visit  Medication Sig   Ascorbic Acid (VITAMIN C) 100 MG tablet Take 100 mg by mouth daily.   aspirin 81 MG tablet Take 81 mg by mouth daily.   atenolol (TENORMIN) 50 MG tablet TAKE 1 TABLET BY MOUTH 2 TIMES A DAY   cholecalciferol (VITAMIN D) 1000 UNITS tablet Take 1,000 Units by mouth daily.   Docosahexaenoic Acid (ALGAL OMEGA-3 DHA PO) Take by mouth.   Fish Oil-Cholecalciferol (FISH OIL + D3 PO) Take 1,000 mg by mouth daily.   glucosamine-chondroitin 500-400 MG tablet Take by  mouth.   latanoprost (XALATAN) 0.005 % ophthalmic solution Place 1 drop into both eyes at bedtime.   lisinopril (ZESTRIL) 10 MG tablet Take 1 tablet (10 mg total) by mouth daily. Only if bp is over 140   meloxicam (MOBIC) 7.5 MG tablet Take 1 tablet (7.5 mg total) by mouth daily. With food   nitroGLYCERIN (NITROSTAT) 0.4 MG SL tablet Place 1 tablet (0.4 mg total) under the tongue every 5 (five) minutes as needed for chest pain. No more than 3tabs in 24hrs. Call 911 if pain does not improve   rosuvastatin (CRESTOR) 5 MG tablet Take 1 tablet (5 mg total) by mouth 3 (three) times a week.   tiZANidine (ZANAFLEX) 4 MG tablet Take 1 tablet (4 mg total) by mouth 2 (two) times daily as needed for muscle spasms.   vitamin E 100 UNIT capsule Take 100 Units by mouth daily.   zolpidem (AMBIEN) 5 MG tablet Take 1 tablet (5 mg total) by mouth at bedtime as needed. Maintain upcoming appointment for addiitonal refills   No facility-administered medications prior to visit.   Reviewed past medical and social history.   ROS per HPI above      Objective:  BP (!) 140/80 (BP Location: Left Arm, Patient Position: Sitting, Cuff Size: Normal)   Pulse (!) 56   Temp 97.6 F (36.4 C) (Temporal)   Ht 5\' 7"  (1.702 m)   Wt 191 lb 12.8 oz (87 kg)   SpO2 97%  BMI 30.04 kg/m      Physical Exam Vitals and nursing note reviewed.  Cardiovascular:     Rate and Rhythm: Normal rate and regular rhythm.     Pulses: Normal pulses.     Heart sounds: Normal heart sounds.  Pulmonary:     Effort: Pulmonary effort is normal.     Breath sounds: Normal breath sounds.  Musculoskeletal:     Right lower leg: No edema.     Left lower leg: No edema.  Neurological:     Mental Status: She is alert and oriented to person, place, and time.     No results found for any visits on 04/05/23.    Assessment & Plan:    Problem List Items Addressed This Visit     Chronic right-sided low back pain   Improved back pain with  toradol IM x2 (10/2022 and 03/2023), mobic 7.5mg   and zanaflex X-ray lumbar spine:Five lumbar type vertebral bodies are well visualized. Vertebral body height is well maintained. Multilevel osteophytic change and Facet hypertrophic changes noted. No anterolisthesis is seen. No soft tissue abnormality is noted.  Advised about need for proper body mechanic at all time. She agreed to outpatient PT.      DM (diabetes mellitus) (HCC)   Diet controlled No retinopathy or nephropathy Repeat UACr, hgbA1c and CMP today      Relevant Orders   Hemoglobin A1c   Microalbumin / creatinine urine ratio   Comprehensive metabolic panel   Hyperlipidemia associated with type 2 diabetes mellitus (HCC)   No adverse effects with crestor 5mg  3x/week Repeat lipid panel and CMP      Relevant Orders   Comprehensive metabolic panel   Lipid panel   Hypothyroid   Relevant Orders   T4, free   TSH   White coat syndrome with diagnosis of hypertension - Primary   Home BP in last 87month: 120s-140s/60s Had to take lisinopril dose most days of the week. Has maintained atenolol BID. Denies any high salt diet. Stays active by working in her garden BP Readings from Last 3 Encounters:  04/05/23 (!) 140/80  03/14/23 130/66  11/09/22 138/88    Advised to maintain current med doses Repeat BMP      Other Visit Diagnoses       Degeneration of intervertebral disc of lumbar region with discogenic back pain and lower extremity pain       Relevant Orders   Ambulatory referral to Physical Therapy      Return in about 3 months (around 07/06/2023) for HTN, DM, hyperlipidemia (fasting).     Alysia Penna, NP

## 2023-04-05 NOTE — Patient Instructions (Signed)
 Go to lab Maintain Heart healthy diet and daily exercise. Maintain current medications.

## 2023-04-05 NOTE — Assessment & Plan Note (Signed)
 Home BP in last 80month: 120s-140s/60s Had to take lisinopril dose most days of the week. Has maintained atenolol BID. Denies any high salt diet. Stays active by working in her garden BP Readings from Last 3 Encounters:  04/05/23 (!) 140/80  03/14/23 130/66  11/09/22 138/88    Advised to maintain current med doses Repeat BMP

## 2023-04-13 ENCOUNTER — Encounter: Payer: Self-pay | Admitting: Physical Therapy

## 2023-04-13 ENCOUNTER — Ambulatory Visit: Attending: Nurse Practitioner | Admitting: Physical Therapy

## 2023-04-13 ENCOUNTER — Other Ambulatory Visit: Payer: Self-pay

## 2023-04-13 DIAGNOSIS — G8929 Other chronic pain: Secondary | ICD-10-CM | POA: Insufficient documentation

## 2023-04-13 DIAGNOSIS — X58XXXA Exposure to other specified factors, initial encounter: Secondary | ICD-10-CM | POA: Insufficient documentation

## 2023-04-13 DIAGNOSIS — M7099 Unspecified soft tissue disorder related to use, overuse and pressure multiple sites: Secondary | ICD-10-CM | POA: Diagnosis not present

## 2023-04-13 DIAGNOSIS — M6281 Muscle weakness (generalized): Secondary | ICD-10-CM | POA: Diagnosis not present

## 2023-04-13 DIAGNOSIS — M5441 Lumbago with sciatica, right side: Secondary | ICD-10-CM | POA: Insufficient documentation

## 2023-04-13 DIAGNOSIS — M51362 Other intervertebral disc degeneration, lumbar region with discogenic back pain and lower extremity pain: Secondary | ICD-10-CM | POA: Diagnosis not present

## 2023-04-13 DIAGNOSIS — M5459 Other low back pain: Secondary | ICD-10-CM | POA: Diagnosis not present

## 2023-04-13 NOTE — Therapy (Signed)
 OUTPATIENT PHYSICAL THERAPY THORACOLUMBAR EVALUATION   Patient Name: Amy Tapia MRN: [id number] DOB:[birth year], 89 y.o., female Today's Date: 04/13/2023  END OF SESSION:  PT End of Session - 04/13/23 1115     Visit Number 1    Number of Visits 9    Date for PT Re-Evaluation 06/08/23    PT Start Time 1120    PT Stop Time 1200    PT Time Calculation (min) 40 min    Activity Tolerance Patient tolerated treatment well    Behavior During Therapy [facility] for tasks assessed/performed             Past Medical History:  Diagnosis Date   Arthritis    "maybe a little bit in my knees" (05/02/2014)   Breast cancer (HCC)    Breast cancer, right breast (HCC)    Glaucoma of both eyes    HLD (hyperlipidemia)    Hypertension    Past Surgical History:  Procedure Laterality Date   ABDOMINAL HYSTERECTOMY  09/1971   BREAST BIOPSY Right 08/1970   CATARACT EXTRACTION W/ INTRAOCULAR LENS IMPLANT Right 04/2013   MASTECTOMY Right 08/1970   TONSILLECTOMY     TYMPANOPLASTY Bilateral ~ 3244   "had eardrums patched w/vein from my arms"   Patient Active Problem List   Diagnosis Date Noted   Chronic right-sided low back pain 03/14/2023   Nonrheumatic aortic valve insufficiency 07/12/2022   Coronary artery disease involving native coronary artery of native heart without angina pectoris 07/12/2022   S/P mastectomy, right 03/17/2022   Myalgia due to statin 02/19/2022   DM (diabetes mellitus) (HCC) 12/16/2021   Aortic atherosclerosis (HCC) 08/20/2021   Hypothyroid 02/24/2021   Personal history of malignant neoplasm of breast 02/23/2021   Sensorineural hearing loss (SNHL) of both ears 02/23/2021   Insomnia 02/23/2021   White coat syndrome with diagnosis of hypertension 05/01/2014   Hyperlipidemia associated with type 2 diabetes mellitus (HCC) 05/01/2014    PCP: Anne Ng, NP  REFERRING PROVIDER:  Anne Ng, NP  REFERRING DIAG:  [phone] (ICD-10-CM) - Degeneration of  intervertebral disc of lumbar region with discogenic back pain and lower extremity pain  M54.41 (ICD-10-CM) - Acute midline low back pain with right-sided sciatica    Rationale for Evaluation and Treatment: Rehabilitation  THERAPY DIAG:  Other low back pain  Unspecified soft tissue disorder related to use, overuse and pressure multiple sites  Muscle weakness (generalized)  PERTINENT HISTORY: CAD, DM2, B hearing loss, hypothyroidism, HTN (white coat syndrome)  WEIGHT BEARING RESTRICTIONS: No  FALLS:  Has patient fallen in last 6 months? No  LIVING ENVIRONMENT: Lives with: lives with their spouse Lives in: House/apartment Stairs: Yes: Internal: 15 steps; on left going up Has following equipment at home: None  OCCUPATION: retired   PRECAUTIONS: None ---------------------------------------------------------------------------------------------  SUBJECTIVE:  SUBJECTIVE STATEMENT: Eval statement 04/13/2023: pt has been having back pain with R sided radicular symptoms, symptoms going down to the ankle originally, onset 6 months. Currently 2/10, feeling tight right now, but not too painful. Was having n/t symptoms that have resolved with the most recent injection.  RED FLAGS: Cervical red flags: none, Bowel or bladder incontinence: No, and Cauda equina syndrome: No    PLOF: Independent  PATIENT GOALS: help the stiffness  NEXT MD VISIT: 3 months, june ---------------------------------------------------------------------------------------------  OBJECTIVE:  Note: Objective measures were completed at Evaluation unless otherwise noted.  DIAGNOSTIC FINDINGS:  IMPRESSION: Multilevel degenerative change without acute abnormality.     Electronically Signed   By: Alcide Clever M.D.   On:  04/01/2023 22:59    PATIENT SURVEYS:  Modified Oswestry 19/50 (38%)   COGNITION: Overall cognitive status: Within functional limits for tasks assessed   PALPATION: Tenderness to R SIJ, and B piriformis (does not recreate symptoms.)  Lumbar contraction pattern  L Multifidus: good quality contraction  R Multifidus: good quality contraction   SENSATION: WFL  MUSCLE LENGTH: Hamstrings: Right 25; deg; Left 20 deg   POSTURE: forward head   LUMBAR ROM:   AROM eval  Flexion 80%  Extension 100%  Right lateral flexion 60%  Left lateral flexion 60%  Right rotation 60%  Left rotation 60%   (Blank rows = not tested)  ! Indicates pain with testing  LOWER EXTREMITY ROM:     Active  Right eval Left eval  Hip flexion    Hip extension    Hip abduction    Hip adduction    Hip internal rotation    Hip external rotation    Knee flexion    Knee extension    Ankle dorsiflexion    Ankle plantarflexion    Ankle inversion    Ankle eversion     (Blank rows = not tested)  ! Indicates pain with testing  LOWER EXTREMITY MMT:    MMT Right eval Left eval  Hip flexion 4- 4-  Hip extension 4 4  Hip abduction 4- 4-  Hip adduction    Hip internal rotation    Hip external rotation    Knee flexion 4 4  Knee extension 4 4  Ankle dorsiflexion    Ankle plantarflexion    Ankle inversion    Ankle eversion     (Blank rows = not tested)   ! Indicates pain with testing LUMBAR SPECIAL TESTS:  Prone instability test: Positive, Slump test: Negative, and SI Compression/distraction test: Positive   GAIT: Distance walked: 172ft Assistive device utilized: None Level of assistance: Complete Independence Comments:   [facility] Adult PT Treatment:                                                DATE: 04/13/2023  Self Care: Pt education POC discussion  PATIENT  EDUCATION:  Education details: Pt received education regarding HEP performance, ADL performance, functional activity tolerance, impairment education, appropriate performance of therapeutic activities.  Person educated: Patient Education method: Explanation, Demonstration, Tactile cues, Verbal cues, and Handouts Education comprehension: verbalized understanding and returned demonstration  HOME EXERCISE PROGRAM: Access Code: [email or web address] URL: [email or web address] Date: 04/13/2023 Prepared by: Sheliah Plane  Exercises - Supine Figure 4 core/lumbar mobility  - 1 x daily - 4-7 x weekly - 2 sets - 20 reps - Supine Bridge  - 1 x daily - 5 x weekly - 3 sets - 8 reps - 5s hold - Supine Lower Trunk Rotation  - 1 x daily - 5 x weekly - 3 sets - 10 reps - 3s hold - Prone Hip Extension  - 1 x daily - 5 x weekly - 2 sets - 10 reps - 8s hold - Sit to Stand with Resistance Around Legs  - 1 x daily - 3-5 x weekly - 2 sets - 12 reps ---------------------------------------------------------------------------------------------  ASSESSMENT:  CLINICAL IMPRESSION: Eval impression (04/13/2023): Pt. attended today's physical therapy session for evaluation of low back pain. Pt has complaints of a tightness she feels in her low back that will get a bit worse with prolonged ADL activity such as cleaning, cooking or yard work. currently 2/10 pain, used to get up to to 10/10 . Pt has notable deficits with global hip strength, hamstring and lumbar muscle motility, as well as lumbo-pelvic stability.  Pt would benefit from therapeutic focus on lumbar/hamstring motility, dynamic core stabilization, and global hip strengthening.  Treatment performed today focused on education detailed in obj. Pt demonstrated great understanding of education provided. required minimal verbal/tactile cues and no physical assistance for appropriate performance with today's activities. Pt requires the intervention of skilled outpatient  physical therapy to address the aforementioned deficits and progress towards a functional level in line with therapeutic goals.    OBJECTIVE IMPAIRMENTS: decreased mobility, decreased strength, impaired flexibility, improper body mechanics, and pain.   ACTIVITY LIMITATIONS: carrying, lifting, and locomotion level  PARTICIPATION LIMITATIONS: interpersonal relationship, community activity, and yard work  PERSONAL FACTORS: Age, Behavior pattern, Fitness, Past/current experiences, and Social background are also affecting patient's functional outcome.   REHAB POTENTIAL: Good  CLINICAL DECISION MAKING: Stable/uncomplicated  EVALUATION COMPLEXITY: Low   GOALS: Goals reviewed with patient? YES  SHORT TERM GOALS: Target date: 05/11/2023  Pt will be independent with administered HEP to demonstrate the competency necessary for long term managemnet of symptoms at home. Baseline: Goal status: INITIAL    LONG TERM GOALS: Target date: 06/08/2023  Pt. Will achieve a MODI score of 13/50 (26%) as to demonstrate improvement in self-perceived functional ability with daily activities.  Baseline: 19/50 (38%) Goal status: INITIAL  2.  Pt will improve Global hip strength to a 4+/5 to demonstrate improvement in strength for quality of motion and activity performance.  Baseline: see obj chart Goal status: INITIAL  3.  Pt will improve Lumbar AROM to 90% of standardized norms with less than 2/10 pain to demonstrate necessary mobility for high quality and safe ADLs  Baseline: se obj chart Goal status: INITIAL  4.  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  Baseline: able, 2/10 pain, cannot do for long Goal status: INITIAL  ---------------------------------------------------------------------------------------------  PLAN:  PT  FREQUENCY: 1x/week  PT DURATION: 8  weeks  PLANNED INTERVENTIONS: 97110-Therapeutic exercises, 97530- Therapeutic activity, [id number]- Neuromuscular re-education, [phone]- Self Care, 95621- Manual therapy, [phone]- Gait training, Patient/Family education, Balance training, Stair training, Taping, Dry Needling, Joint mobilization, and Spinal mobilization.  PLAN FOR NEXT SESSION: Review HEP, Begin POC as detailed in assessment   Sheliah Plane, PT, DPT 04/13/2023, 12:23 PM

## 2023-04-20 ENCOUNTER — Ambulatory Visit

## 2023-04-20 DIAGNOSIS — M6281 Muscle weakness (generalized): Secondary | ICD-10-CM

## 2023-04-20 DIAGNOSIS — M5459 Other low back pain: Secondary | ICD-10-CM | POA: Diagnosis not present

## 2023-04-20 DIAGNOSIS — M7099 Unspecified soft tissue disorder related to use, overuse and pressure multiple sites: Secondary | ICD-10-CM

## 2023-04-20 NOTE — Therapy (Signed)
 OUTPATIENT PHYSICAL THERAPY TREATMENT NOTE   Patient Name: Amy Tapia MRN: [id number] DOB:[birth year], 89 y.o., female Today's Date: 04/20/2023  END OF SESSION:  PT End of Session - 04/20/23 0954     Visit Number 2    Number of Visits 9    Date for PT Re-Evaluation 06/08/23    PT Start Time 1000    PT Stop Time 1040    PT Time Calculation (min) 40 min    Activity Tolerance Patient tolerated treatment well    Behavior During Therapy [facility] for tasks assessed/performed            Past Medical History:  Diagnosis Date   Arthritis    "maybe a little bit in my knees" (05/02/2014)   Breast cancer (HCC)    Breast cancer, right breast (HCC)    Glaucoma of both eyes    HLD (hyperlipidemia)    Hypertension    Past Surgical History:  Procedure Laterality Date   ABDOMINAL HYSTERECTOMY  09/1971   BREAST BIOPSY Right 08/1970   CATARACT EXTRACTION W/ INTRAOCULAR LENS IMPLANT Right 04/2013   MASTECTOMY Right 08/1970   TONSILLECTOMY     TYMPANOPLASTY Bilateral ~ 7829   "had eardrums patched w/vein from my arms"   Patient Active Problem List   Diagnosis Date Noted   Chronic right-sided low back pain 03/14/2023   Nonrheumatic aortic valve insufficiency 07/12/2022   Coronary artery disease involving native coronary artery of native heart without angina pectoris 07/12/2022   S/P mastectomy, right 03/17/2022   Myalgia due to statin 02/19/2022   DM (diabetes mellitus) (HCC) 12/16/2021   Aortic atherosclerosis (HCC) 08/20/2021   Hypothyroid 02/24/2021   Personal history of malignant neoplasm of breast 02/23/2021   Sensorineural hearing loss (SNHL) of both ears 02/23/2021   Insomnia 02/23/2021   White coat syndrome with diagnosis of hypertension 05/01/2014   Hyperlipidemia associated with type 2 diabetes mellitus (HCC) 05/01/2014    PCP: Anne Ng, NP  REFERRING PROVIDER:  Anne Ng, NP  REFERRING DIAG:  [phone] (ICD-10-CM) - Degeneration of intervertebral disc  of lumbar region with discogenic back pain and lower extremity pain  M54.41 (ICD-10-CM) - Acute midline low back pain with right-sided sciatica    Rationale for Evaluation and Treatment: Rehabilitation  THERAPY DIAG:  Other low back pain  Unspecified soft tissue disorder related to use, overuse and pressure multiple sites  Muscle weakness (generalized)  PERTINENT HISTORY: CAD, DM2, B hearing loss, hypothyroidism, HTN (white coat syndrome)  WEIGHT BEARING RESTRICTIONS: No  FALLS:  Has patient fallen in last 6 months? No  LIVING ENVIRONMENT: Lives with: lives with their spouse Lives in: House/apartment Stairs: Yes: Internal: 15 steps; on left going up Has following equipment at home: None  OCCUPATION: retired   PRECAUTIONS: None ---------------------------------------------------------------------------------------------  SUBJECTIVE:  SUBJECTIVE STATEMENT: Patient reports that she isn't having much pain today, has done her exercises some. She states there has been improvement in her pain and ability to sleep since receiving injection.  Eval statement 04/13/2023: pt has been having back pain with R sided radicular symptoms, symptoms going down to the ankle originally, onset 6 months. Currently 2/10, feeling tight right now, but not too painful. Was having n/t symptoms that have resolved with the most recent injection.  RED FLAGS: Cervical red flags: none, Bowel or bladder incontinence: No, and Cauda equina syndrome: No    PLOF: Independent  PATIENT GOALS: help the stiffness  NEXT MD VISIT: 3 months, june ---------------------------------------------------------------------------------------------  OBJECTIVE:  Note: Objective measures were completed at Evaluation unless otherwise  noted.  DIAGNOSTIC FINDINGS:  IMPRESSION: Multilevel degenerative change without acute abnormality.     Electronically Signed   By: Alcide Clever M.D.   On: 04/01/2023 22:59    PATIENT SURVEYS:  Modified Oswestry 19/50 (38%)   COGNITION: Overall cognitive status: Within functional limits for tasks assessed   PALPATION: Tenderness to R SIJ, and B piriformis (does not recreate symptoms.)  Lumbar contraction pattern  L Multifidus: good quality contraction  R Multifidus: good quality contraction   SENSATION: WFL  MUSCLE LENGTH: Hamstrings: Right 25; deg; Left 20 deg   POSTURE: forward head   LUMBAR ROM:   AROM eval  Flexion 80%  Extension 100%  Right lateral flexion 60%  Left lateral flexion 60%  Right rotation 60%  Left rotation 60%   (Blank rows = not tested)  ! Indicates pain with testing  LOWER EXTREMITY ROM:     Active  Right eval Left eval  Hip flexion    Hip extension    Hip abduction    Hip adduction    Hip internal rotation    Hip external rotation    Knee flexion    Knee extension    Ankle dorsiflexion    Ankle plantarflexion    Ankle inversion    Ankle eversion     (Blank rows = not tested)  ! Indicates pain with testing  LOWER EXTREMITY MMT:    MMT Right eval Left eval  Hip flexion 4- 4-  Hip extension 4 4  Hip abduction 4- 4-  Hip adduction    Hip internal rotation    Hip external rotation    Knee flexion 4 4  Knee extension 4 4  Ankle dorsiflexion    Ankle plantarflexion    Ankle inversion    Ankle eversion     (Blank rows = not tested)   ! Indicates pain with testing LUMBAR SPECIAL TESTS:  Prone instability test: Positive, Slump test: Negative, and SI Compression/distraction test: Positive   GAIT: Distance walked: 143ft Assistive device utilized: None Level of assistance: Complete Independence Comments:   [facility] Adult PT Treatment:                                                DATE: 04/20/23 Therapeutic  Exercise: Nustep level 5 x 5 mins while gathering subjective and planning session with patient Seated hamstring stretch 2x30" BIL Bridges 2x10 Hooklying clamshell GTB 2x15 Hooklying marching GTB 2x30" Supine figure 4 piriformis stretch 2x30" BIL LTR x10 in abd x10 SLR x10 BIL STS arms crossed 2x10  [facility] Adult PT Treatment:  DATE: 04/13/2023  Self Care: Pt education POC discussion                                                                                                                                PATIENT EDUCATION:  Education details: Pt received education regarding HEP performance, ADL performance, functional activity tolerance, impairment education, appropriate performance of therapeutic activities.  Person educated: Patient Education method: Explanation, Demonstration, Tactile cues, Verbal cues, and Handouts Education comprehension: verbalized understanding and returned demonstration  HOME EXERCISE PROGRAM: Access Code: [email or web address] URL: [email or web address] Date: 04/13/2023 Prepared by: Sheliah Plane  Exercises - Supine Figure 4 core/lumbar mobility  - 1 x daily - 4-7 x weekly - 2 sets - 20 reps - Supine Bridge  - 1 x daily - 5 x weekly - 3 sets - 8 reps - 5s hold - Supine Lower Trunk Rotation  - 1 x daily - 5 x weekly - 3 sets - 10 reps - 3s hold - Prone Hip Extension  - 1 x daily - 5 x weekly - 2 sets - 10 reps - 8s hold - Sit to Stand with Resistance Around Legs  - 1 x daily - 3-5 x weekly - 2 sets - 12 reps ---------------------------------------------------------------------------------------------  ASSESSMENT:  CLINICAL IMPRESSION: Patient presents to first follow up PT session reporting minimal pain currently and that she had completed some of her home exercises. Session today focused on strengthening for core and hips as well as stretching and lumbar mobility. Patient was able to tolerate all  prescribed exercises with no adverse effects. Patient continues to benefit from skilled PT services and should be progressed as able to improve functional independence.   Eval impression (04/13/2023): Pt. attended today's physical therapy session for evaluation of low back pain. Pt has complaints of a tightness she feels in her low back that will get a bit worse with prolonged ADL activity such as cleaning, cooking or yard work. currently 2/10 pain, used to get up to to 10/10 . Pt has notable deficits with global hip strength, hamstring and lumbar muscle motility, as well as lumbo-pelvic stability.  Pt would benefit from therapeutic focus on lumbar/hamstring motility, dynamic core stabilization, and global hip strengthening.  Treatment performed today focused on education detailed in obj. Pt demonstrated great understanding of education provided. required minimal verbal/tactile cues and no physical assistance for appropriate performance with today's activities. Pt requires the intervention of skilled outpatient physical therapy to address the aforementioned deficits and progress towards a functional level in line with therapeutic goals.   OBJECTIVE IMPAIRMENTS: decreased mobility, decreased strength, impaired flexibility, improper body mechanics, and pain.   ACTIVITY LIMITATIONS: carrying, lifting, and locomotion level  PARTICIPATION LIMITATIONS: interpersonal relationship, community activity, and yard work  PERSONAL FACTORS: Age, Behavior pattern, Fitness, Past/current experiences, and Social background are also affecting patient's functional outcome.   REHAB POTENTIAL: Good  CLINICAL DECISION MAKING: Stable/uncomplicated  EVALUATION COMPLEXITY: Low   GOALS: Goals  reviewed with patient? YES  SHORT TERM GOALS: Target date: 05/11/2023  Pt will be independent with administered HEP to demonstrate the competency necessary for long term managemnet of symptoms at home. Baseline: Goal status:  INITIAL    LONG TERM GOALS: Target date: 06/08/2023  Pt. Will achieve a MODI score of 13/50 (26%) as to demonstrate improvement in self-perceived functional ability with daily activities.  Baseline: 19/50 (38%) Goal status: INITIAL  2.  Pt will improve Global hip strength to a 4+/5 to demonstrate improvement in strength for quality of motion and activity performance.  Baseline: see obj chart Goal status: INITIAL  3.  Pt will improve Lumbar AROM to 90% of standardized norms with less than 2/10 pain to demonstrate necessary mobility for high quality and safe ADLs  Baseline: se obj chart Goal status: INITIAL  4.  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  Baseline: able, 2/10 pain, cannot do for long Goal status: INITIAL  ---------------------------------------------------------------------------------------------  PLAN:  PT FREQUENCY: 1x/week  PT DURATION: 8 weeks  PLANNED INTERVENTIONS: 97110-Therapeutic exercises, 97530- Therapeutic activity, 97112- Neuromuscular re-education, 97535- Self Care, [id number]- Manual therapy, [phone]- Gait training, Patient/Family education, Balance training, Stair training, Taping, Dry Needling, Joint mobilization, and Spinal mobilization.  PLAN FOR NEXT SESSION: Review HEP, Begin POC as detailed in assessment   Berta Minor PTA  04/20/2023, 9:55 AM

## 2023-04-22 ENCOUNTER — Telehealth: Payer: Self-pay | Admitting: Internal Medicine

## 2023-04-22 DIAGNOSIS — I10 Essential (primary) hypertension: Secondary | ICD-10-CM

## 2023-04-22 MED ORDER — LISINOPRIL 40 MG PO TABS: 40.0000 mg | ORAL_TABLET | Freq: Every day | ORAL | 3 refills | Status: AC

## 2023-04-22 NOTE — Telephone Encounter (Signed)
 Pt returning call to a nurse

## 2023-04-22 NOTE — Telephone Encounter (Signed)
 Called pt to f/u spouse reports pt had a rough night and is just now getting to sleep.  Will have pt call our office when she wakes up.

## 2023-04-22 NOTE — Telephone Encounter (Signed)
 Spoke with pt who reports increasing elevated BP's as below for the past 2 months.  She denies current CP, SOB, dizziness or edema.  She reports she follows a low sodium diet.  She is taking Atenolol as prescribed.  She states for the past 2 weeks she has had to take the extra Lisinopril daily due to BP systolic greater than 140.  HR's remain consistent in the 50's.  The daily Lisinopril has not seemed to decrease BP. Pt advised to continue monitoring BP 2 hours after taking medications and log.  Will forward message to Dr Izora Ribas and his RN for review and recommendation.  Pt verbalizes understanding and agrees with current plan.

## 2023-04-22 NOTE — Telephone Encounter (Signed)
 Spoke with patient and she is aware of provider recommendations. She will stop lisinopril 10 0-20 mg and start lisinopril 40 mg daily.  BMP in 2 weeks. She has appointment with PCP already scheduled.  Labs ordered and medication sent to pharmacy

## 2023-04-22 NOTE — Telephone Encounter (Signed)
 Pt c/o BP issue: STAT if pt c/o blurred vision, one-sided weakness or slurred speech.  1. What is your BP concern?  BP is gradually increasing weekly  2. Have you taken any BP medication today? Yes, she took Atenolol 50 MG. She takes it at night as well. She also mentions taking an extra Lisinopril tablets.  3. What are your last 5 BP readings? 145, 150/67, 164/68, 159/64, 166/68, 153, 152/66  4. Are you having any other symptoms (ex. Dizziness, headache, blurred vision, passed out)?  no

## 2023-04-22 NOTE — Telephone Encounter (Signed)
 Pt returning nurse's call. Please advise

## 2023-04-27 ENCOUNTER — Ambulatory Visit: Admitting: Physical Therapy

## 2023-05-04 ENCOUNTER — Encounter: Payer: Self-pay | Admitting: Nurse Practitioner

## 2023-05-04 ENCOUNTER — Ambulatory Visit (INDEPENDENT_AMBULATORY_CARE_PROVIDER_SITE_OTHER): Admitting: Nurse Practitioner

## 2023-05-04 ENCOUNTER — Encounter: Payer: Self-pay | Admitting: Physical Therapy

## 2023-05-04 ENCOUNTER — Other Ambulatory Visit: Payer: Self-pay

## 2023-05-04 ENCOUNTER — Ambulatory Visit: Admitting: Physical Therapy

## 2023-05-04 VITALS — BP 120/70 | HR 67 | Temp 97.1°F | Ht 68.0 in | Wt 191.2 lb

## 2023-05-04 DIAGNOSIS — I10 Essential (primary) hypertension: Secondary | ICD-10-CM

## 2023-05-04 DIAGNOSIS — M5459 Other low back pain: Secondary | ICD-10-CM

## 2023-05-04 DIAGNOSIS — G8929 Other chronic pain: Secondary | ICD-10-CM

## 2023-05-04 DIAGNOSIS — M5441 Lumbago with sciatica, right side: Secondary | ICD-10-CM

## 2023-05-04 DIAGNOSIS — F5101 Primary insomnia: Secondary | ICD-10-CM | POA: Diagnosis not present

## 2023-05-04 DIAGNOSIS — M6281 Muscle weakness (generalized): Secondary | ICD-10-CM

## 2023-05-04 DIAGNOSIS — M7099 Unspecified soft tissue disorder related to use, overuse and pressure multiple sites: Secondary | ICD-10-CM

## 2023-05-04 LAB — BASIC METABOLIC PANEL WITH GFR
BUN/Creatinine Ratio: 15 (ref 12–28)
BUN: 13 mg/dL (ref 8–27)
CO2: 24 mmol/L (ref 20–29)
Calcium: 9.4 mg/dL (ref 8.7–10.3)
Chloride: 104 mmol/L (ref 96–106)
Creatinine, Ser: 0.89 mg/dL (ref 0.57–1.00)
Glucose: 121 mg/dL — ABNORMAL HIGH (ref 70–99)
Potassium: 4.6 mmol/L (ref 3.5–5.2)
Sodium: 143 mmol/L (ref 134–144)
eGFR: 63 mL/min/{1.73_m2} (ref >59)

## 2023-05-04 MED ORDER — TIZANIDINE HCL 4 MG PO TABS: 4.0000 mg | ORAL_TABLET | Freq: Every day | ORAL | 0 refills | Status: AC | PRN

## 2023-05-04 MED ORDER — ZOLPIDEM TARTRATE 5 MG PO TABS: 5.0000 mg | ORAL_TABLET | Freq: Every evening | ORAL | 5 refills | Status: DC | PRN

## 2023-05-04 NOTE — Patient Instructions (Addendum)
 Maintain current medications Do not take zanaflex  within 6hrs of ambien  dose

## 2023-05-04 NOTE — Assessment & Plan Note (Signed)
 Chronic Current use of ambien  daily at hs Denies any adverse side effects. Ambien  10mg  last filled 01/14/2023, 03/07/23, 04/04/23  Maintain current medication Refill sent

## 2023-05-04 NOTE — Progress Notes (Signed)
 Established Patient Visit  Patient: Amy Tapia   DOB: 09-29-1933   89 y.o. Female  MRN: [id number] Visit Date: 05/04/2023  Subjective:    Chief Complaint  Patient presents with   Follow-up    F/u for HTN   HPI Insomnia Chronic Current use of ambien  daily at hs Denies any adverse side effects. Ambien  10mg  last filled 01/14/2023, 03/07/23, 04/04/23  Maintain current medication Refill sent  Chronic right-sided low back pain Improved back pain with outpatient PT, use of tylenol , mobic  and zanaflex  prn.  Advised not to use Zanaflex  within 6hrs of ambien  due to risk of sedation and respiratory depression.  Primary hypertension BP at goal with atenolol  50mg  BID and lisinopril  40mg  every day. BP Readings from Last 3 Encounters:  05/04/23 120/70  04/05/23 (!) 140/80  03/14/23 130/66    Maintain med doses  Reviewed medical, surgical, and social history today  Medications: Outpatient Medications Prior to Visit  Medication Sig   Ascorbic Acid  (VITAMIN C ) 100 MG tablet Take 100 mg by mouth daily.   aspirin  81 MG tablet Take 81 mg by mouth daily.   atenolol  (TENORMIN ) 50 MG tablet TAKE 1 TABLET BY MOUTH 2 TIMES A DAY   cholecalciferol  (VITAMIN D) 1000 UNITS tablet Take 1,000 Units by mouth daily.   Docosahexaenoic Acid (ALGAL OMEGA-3 DHA PO) Take by mouth.   Fish Oil-Cholecalciferol  (FISH OIL + D3 PO) Take 1,000 mg by mouth daily.   glucosamine-chondroitin 500-400 MG tablet Take by mouth.   latanoprost  (XALATAN ) 0.005 % ophthalmic solution Place 1 drop into both eyes at bedtime.   lisinopril  (ZESTRIL ) 40 MG tablet Take 1 tablet (40 mg total) by mouth daily.   meloxicam  (MOBIC ) 7.5 MG tablet Take 1 tablet (7.5 mg total) by mouth daily. With food   nitroGLYCERIN  (NITROSTAT ) 0.4 MG SL tablet Place 1 tablet (0.4 mg total) under the tongue every 5 (five) minutes as needed for chest pain. No more than 3tabs in 24hrs. Call 911 if pain does not improve   rosuvastatin   (CRESTOR ) 5 MG tablet Take 1 tablet (5 mg total) by mouth 3 (three) times a week.   vitamin E  100 UNIT capsule Take 100 Units by mouth daily.   [DISCONTINUED] tiZANidine  (ZANAFLEX ) 4 MG tablet Take 1 tablet (4 mg total) by mouth 2 (two) times daily as needed for muscle spasms.   [DISCONTINUED] zolpidem  (AMBIEN ) 5 MG tablet Take 1 tablet (5 mg total) by mouth at bedtime as needed. Maintain upcoming appointment for addiitonal refills   No facility-administered medications prior to visit.   Reviewed past medical and social history.   ROS per HPI above      Objective:  BP 120/70 (BP Location: Left Arm, Patient Position: Sitting, Cuff Size: Normal)   Pulse 67   Temp (!) 97.1 F (36.2 C) (Temporal)   Ht 5\' 8"  (1.727 m)   Wt 191 lb 3.2 oz (86.7 kg)   SpO2 95%   BMI 29.07 kg/m      Physical Exam Vitals and nursing note reviewed.  Cardiovascular:     Rate and Rhythm: Normal rate and regular rhythm.     Pulses: Normal pulses.     Heart sounds: Normal heart sounds.  Pulmonary:     Effort: Pulmonary effort is normal.  Musculoskeletal:     Right lower leg: No edema.     Left lower leg: No edema.  Neurological:  Mental Status: She is alert and oriented to person, place, and time.     No results found for any visits on 05/04/23.    Assessment & Plan:    Problem List Items Addressed This Visit     Chronic right-sided low back pain   Improved back pain with outpatient PT, use of tylenol , mobic  and zanaflex  prn.  Advised not to use Zanaflex  within 6hrs of ambien  due to risk of sedation and respiratory depression.      Relevant Medications   tiZANidine  (ZANAFLEX ) 4 MG tablet   Insomnia   Chronic Current use of ambien  daily at hs Denies any adverse side effects. Ambien  10mg  last filled 01/14/2023, 03/07/23, 04/04/23  Maintain current medication Refill sent      Relevant Medications   zolpidem  (AMBIEN ) 5 MG tablet   Primary hypertension - Primary   BP at goal with  atenolol  50mg  BID and lisinopril  40mg  every day. BP Readings from Last 3 Encounters:  05/04/23 120/70  04/05/23 (!) 140/80  03/14/23 130/66    Maintain med doses      Return in about 6 months (around 11/03/2023) for HTN, hyperlipidemia (fasting).     Kathrene Parents, NP

## 2023-05-04 NOTE — Assessment & Plan Note (Addendum)
 Improved back pain with outpatient PT, use of tylenol , mobic  and zanaflex  prn.  Advised not to use Zanaflex  within 6hrs of ambien  due to risk of sedation and respiratory depression.

## 2023-05-04 NOTE — Therapy (Addendum)
 OUTPATIENT PHYSICAL THERAPY TREATMENT NOTE   Patient Name: Amy Tapia MRN: [id number] DOB:1933-07-15, 89 y.o., female Today's Date: 05/04/2023  END OF SESSION:  PT End of Session - 05/04/23 1034     Visit Number 3    Number of Visits 9    Date for PT Re-Evaluation 06/08/23    PT Start Time 1000    PT Stop Time 1038    PT Time Calculation (min) 38 min             Past Medical History:  Diagnosis Date   Arthritis    "maybe a little bit in my knees" (05/02/2014)   Breast cancer (HCC)    Breast cancer, right breast (HCC)    Glaucoma of both eyes    HLD (hyperlipidemia)    Hypertension    Past Surgical History:  Procedure Laterality Date   ABDOMINAL HYSTERECTOMY  09/1971   BREAST BIOPSY Right 08/1970   CATARACT EXTRACTION W/ INTRAOCULAR LENS IMPLANT Right 04/2013   MASTECTOMY Right 08/1970   TONSILLECTOMY     TYMPANOPLASTY Bilateral ~ 4098   "had eardrums patched w/vein from my arms"   Patient Active Problem List   Diagnosis Date Noted   Chronic right-sided low back pain 03/14/2023   Nonrheumatic aortic valve insufficiency 07/12/2022   Coronary artery disease involving native coronary artery of native heart without angina pectoris 07/12/2022   S/P mastectomy, right 03/17/2022   Myalgia due to statin 02/19/2022   DM (diabetes mellitus) (HCC) 12/16/2021   Aortic atherosclerosis (HCC) 08/20/2021   Hypothyroid 02/24/2021   Personal history of malignant neoplasm of breast 02/23/2021   Sensorineural hearing loss (SNHL) of both ears 02/23/2021   Insomnia 02/23/2021   White coat syndrome with diagnosis of hypertension 05/01/2014   Hyperlipidemia associated with type 2 diabetes mellitus (HCC) 05/01/2014    PHYSICAL THERAPY DISCHARGE SUMMARY  Visits from Start of Care: 3  Current functional level related to goals / functional outcomes: see assessment   Remaining deficits: see assessment   Education / Equipment: see assessment   Patient agrees to discharge.  Patient goals were unknown. Patient is being discharged due to not returning since the last visit.  Albesa Huguenin, PT, DPT 06/16/2023, 10:36 AM    PCP: Kandace Organ, NP  REFERRING PROVIDER:  Kandace Organ, NP  REFERRING DIAG:  [phone] (ICD-10-CM) - Degeneration of intervertebral disc of lumbar region with discogenic back pain and lower extremity pain  M54.41 (ICD-10-CM) - Acute midline low back pain with right-sided sciatica    Rationale for Evaluation and Treatment: Rehabilitation  THERAPY DIAG:  Other low back pain  Unspecified soft tissue disorder related to use, overuse and pressure multiple sites  Muscle weakness (generalized)  PERTINENT HISTORY: CAD, DM2, B hearing loss, hypothyroidism, HTN (white coat syndrome)  WEIGHT BEARING RESTRICTIONS: No  FALLS:  Has patient fallen in last 6 months? No  LIVING ENVIRONMENT: Lives with: lives with their spouse Lives in: House/apartment Stairs: Yes: Internal: 15 steps; on left going up Has following equipment at home: None  OCCUPATION: retired   PRECAUTIONS: None ---------------------------------------------------------------------------------------------  SUBJECTIVE:  SUBJECTIVE STATEMENT: Pt attended today's session with reports of 1/10 pain. Pt stated that they have maintained good compliance with current HEP.  Also stated that she started with an increase in BP medications due to HTN recently, if BP gets too high will have a headache, currently no headache or other adverse symptoms are noted.   Eval statement 04/13/2023: pt has been having back pain with R sided radicular symptoms, symptoms going down to the ankle originally, onset 6 months. Currently 2/10, feeling tight right now, but not too painful. Was having n/t symptoms  that have resolved with the most recent injection.  RED FLAGS: Cervical red flags: none, Bowel or bladder incontinence: No, and Cauda equina syndrome: No    PLOF: Independent  PATIENT GOALS: help the stiffness  NEXT MD VISIT: 3 months, june ---------------------------------------------------------------------------------------------  OBJECTIVE:  Note: Objective measures were completed at Evaluation unless otherwise noted.  DIAGNOSTIC FINDINGS:  IMPRESSION: Multilevel degenerative change without acute abnormality.     Electronically Signed   By: Violeta Grey M.D.   On: 04/01/2023 22:59    PATIENT SURVEYS:  Modified Oswestry 19/50 (38%)   COGNITION: Overall cognitive status: Within functional limits for tasks assessed   PALPATION: Tenderness to R SIJ, and B piriformis (does not recreate symptoms.)  Lumbar contraction pattern  L Multifidus: good quality contraction  R Multifidus: good quality contraction   SENSATION: WFL  MUSCLE LENGTH: Hamstrings: Right 25; deg; Left 20 deg   POSTURE: forward head   LUMBAR ROM:   AROM eval  Flexion 80%  Extension 100%  Right lateral flexion 60%  Left lateral flexion 60%  Right rotation 60%  Left rotation 60%   (Blank rows = not tested)  ! Indicates pain with testing  LOWER EXTREMITY ROM:     Active  Right eval Left eval  Hip flexion    Hip extension    Hip abduction    Hip adduction    Hip internal rotation    Hip external rotation    Knee flexion    Knee extension    Ankle dorsiflexion    Ankle plantarflexion    Ankle inversion    Ankle eversion     (Blank rows = not tested)  ! Indicates pain with testing  LOWER EXTREMITY MMT:    MMT Right eval Left eval  Hip flexion 4- 4-  Hip extension 4 4  Hip abduction 4- 4-  Hip adduction    Hip internal rotation    Hip external rotation    Knee flexion 4 4  Knee extension 4 4  Ankle dorsiflexion    Ankle plantarflexion    Ankle inversion    Ankle  eversion     (Blank rows = not tested)   ! Indicates pain with testing LUMBAR SPECIAL TESTS:  Prone instability test: Positive, Slump test: Negative, and SI Compression/distraction test: Positive   GAIT: Distance walked: 137ft Assistive device utilized: None Level of assistance: Complete Independence Comments:   TREATMENT:  [facility] Adult PT Treatment:                                                DATE: 05/04/2023  Therapeutic Exercise: NuStep 8' Piriformis cross body stretch 2x1'  Therapeutic Activity: Hip fall out 2x8 B, 3s hold  Hip 3 way 2x10, 2s hold    [facility] Adult PT Treatment:  DATE: 04/20/23 Therapeutic Exercise: Nustep level 5 x 5 mins while gathering subjective and planning session with patient Seated hamstring stretch 2x30" BIL Bridges 2x10 Hooklying clamshell GTB 2x15 Hooklying marching GTB 2x30" Supine figure 4 piriformis stretch 2x30" BIL LTR x10 in abd x10 SLR x10 BIL STS arms crossed 2x10   PATIENT EDUCATION:  Education details: Pt received education regarding HEP performance, ADL performance, functional activity tolerance, impairment education, appropriate performance of therapeutic activities.  Person educated: Patient Education method: Explanation, Demonstration, Tactile cues, Verbal cues, and Handouts Education comprehension: verbalized understanding and returned demonstration  HOME EXERCISE PROGRAM: Access Code: [email or web address] URL: [email or web address] Date: 04/13/2023 Prepared by: Albesa Huguenin  Exercises - Supine Figure 4 core/lumbar mobility  - 1 x daily - 4-7 x weekly - 2 sets - 20 reps - Supine Bridge  - 1 x daily - 5 x weekly - 3 sets - 8 reps - 5s hold - Supine Lower Trunk Rotation  - 1 x daily - 5 x weekly - 3 sets - 10 reps - 3s hold - Prone Hip Extension  - 1 x daily - 5 x weekly - 2 sets - 10 reps - 8s hold - Sit to Stand with Resistance Around Legs  - 1 x daily - 3-5 x weekly  - 2 sets - 12 reps ---------------------------------------------------------------------------------------------  ASSESSMENT:  CLINICAL IMPRESSION: Pt attended physical therapy session for continuation of treatment regarding intermittent low back pain. Pt attended todaay Today's treatment focused on improvement of  global lumbo-hip strength. Pt showed  great tolerance to administered treatment with no adverse effects by the end of session. Pt required minimal verbal/tactile cuing alongside Stand by physical assistance for safe and appropriate performance of today's standing activities. Pt continues to have low pain levels and improved function at home, consider d/c for at home management of symptoms and reassess goals next visit.   Eval impression (04/13/2023): Pt. attended today's physical therapy session for evaluation of low back pain. Pt has complaints of a tightness she feels in her low back that will get a bit worse with prolonged ADL activity such as cleaning, cooking or yard work. currently 2/10 pain, used to get up to to 10/10 . Pt has notable deficits with global hip strength, hamstring and lumbar muscle motility, as well as lumbo-pelvic stability.  Pt would benefit from therapeutic focus on lumbar/hamstring motility, dynamic core stabilization, and global hip strengthening.  Treatment performed today focused on education detailed in obj. Pt demonstrated great understanding of education provided. required minimal verbal/tactile cues and no physical assistance for appropriate performance with today's activities. Pt requires the intervention of skilled outpatient physical therapy to address the aforementioned deficits and progress towards a functional level in line with therapeutic goals.   OBJECTIVE IMPAIRMENTS: decreased mobility, decreased strength, impaired flexibility, improper body mechanics, and pain.   ACTIVITY LIMITATIONS: carrying, lifting, and locomotion level  PARTICIPATION  LIMITATIONS: interpersonal relationship, community activity, and yard work  PERSONAL FACTORS: Age, Behavior pattern, Fitness, Past/current experiences, and Social background are also affecting patient's functional outcome.   REHAB POTENTIAL: Good  CLINICAL DECISION MAKING: Stable/uncomplicated  EVALUATION COMPLEXITY: Low   GOALS: Goals reviewed with patient? YES  SHORT TERM GOALS: Target date: 05/11/2023  Pt will be independent with administered HEP to demonstrate the competency necessary for long term managemnet of symptoms at home. Baseline: Goal status: INITIAL    LONG TERM GOALS: Target date: 06/08/2023  Pt. Will achieve a MODI score of 13/50 (26%) as to demonstrate improvement in self-perceived  functional ability with daily activities.  Baseline: 19/50 (38%) Goal status: INITIAL  2.  Pt will improve Global hip strength to a 4+/5 to demonstrate improvement in strength for quality of motion and activity performance.  Baseline: see obj chart Goal status: INITIAL  3.  Pt will improve Lumbar AROM to 90% of standardized norms with less than 2/10 pain to demonstrate necessary mobility for high quality and safe ADLs  Baseline: se obj chart Goal status: INITIAL  4.  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  Baseline: able, 2/10 pain, cannot do for long Goal status: INITIAL  ---------------------------------------------------------------------------------------------  PLAN:  PT FREQUENCY: 1x/week  PT DURATION: 8 weeks  PLANNED INTERVENTIONS: 97110-Therapeutic exercises, 97530- Therapeutic activity, 97112- Neuromuscular re-education, 97535- Self Care, 16109- Manual therapy, [id number]- Gait training, Patient/Family education, Balance training, Stair training, Taping, Dry Needling, Joint mobilization, and Spinal  mobilization.  PLAN FOR NEXT SESSION: Consider d/c for at home management of symptoms and reassess goals next visit.  Albesa Huguenin, PT, DPT 05/04/2023, 10:41 AM

## 2023-05-04 NOTE — Assessment & Plan Note (Signed)
 BP at goal with atenolol  50mg  BID and lisinopril  40mg  every day. BP Readings from Last 3 Encounters:  05/04/23 120/70  04/05/23 (!) 140/80  03/14/23 130/66    Maintain med doses

## 2023-05-05 ENCOUNTER — Ambulatory Visit: Admitting: Nurse Practitioner

## 2023-05-11 ENCOUNTER — Ambulatory Visit: Admitting: Physical Therapy

## 2023-05-31 LAB — HM DIABETES EYE EXAM

## 2023-06-02 DIAGNOSIS — H401131 Primary open-angle glaucoma, bilateral, mild stage: Secondary | ICD-10-CM | POA: Diagnosis not present

## 2023-06-02 DIAGNOSIS — H26492 Other secondary cataract, left eye: Secondary | ICD-10-CM | POA: Diagnosis not present

## 2023-06-02 DIAGNOSIS — H35372 Puckering of macula, left eye: Secondary | ICD-10-CM | POA: Diagnosis not present

## 2023-06-02 DIAGNOSIS — H04123 Dry eye syndrome of bilateral lacrimal glands: Secondary | ICD-10-CM | POA: Diagnosis not present

## 2023-06-08 ENCOUNTER — Other Ambulatory Visit: Payer: Self-pay | Admitting: Nurse Practitioner

## 2023-06-08 DIAGNOSIS — E1169 Type 2 diabetes mellitus with other specified complication: Secondary | ICD-10-CM

## 2023-06-08 DIAGNOSIS — I700 Atherosclerosis of aorta: Secondary | ICD-10-CM

## 2023-06-08 NOTE — Telephone Encounter (Unsigned)
 Copied from CRM [id number]. Topic: Clinical - Medication Refill >> Jun 08, 2023  9:39 AM Kita Perish H wrote: Medication: rosuvastatin  (CRESTOR ) 5 MG tablet  Has the patient contacted their pharmacy? No, [facility] calling in refill (Agent: If no, request that the patient contact the pharmacy for the refill. If patient does not wish to contact the pharmacy document the reason why and proceed with request.) (Agent: If yes, when and what did the pharmacy advise?)  This is the patient's preferred pharmacy:  [facility] - [place], [place] - [address] [place] [address] Phone: [phone] Fax: [phone]  Is this the correct pharmacy for this prescription? Yes If no, delete pharmacy and type the correct one.   Has the prescription been filled recently? No  Is the patient out of the medication? Yes  Has the patient been seen for an appointment in the last year OR does the patient have an upcoming appointment? Yes  Can we respond through MyChart? No  Agent: Please be advised that Rx refills may take up to 3 business days. We ask that you follow-up with your pharmacy.

## 2023-06-09 ENCOUNTER — Encounter: Payer: Self-pay | Admitting: Nurse Practitioner

## 2023-06-09 MED ORDER — ROSUVASTATIN CALCIUM 5 MG PO TABS: 5.0000 mg | ORAL_TABLET | ORAL | 3 refills | Status: AC

## 2023-06-10 ENCOUNTER — Other Ambulatory Visit: Payer: Self-pay | Admitting: Nurse Practitioner

## 2023-06-10 DIAGNOSIS — Z1231 Encounter for screening mammogram for malignant neoplasm of breast: Secondary | ICD-10-CM

## 2023-07-06 ENCOUNTER — Ambulatory Visit: Admitting: Nurse Practitioner

## 2023-07-18 ENCOUNTER — Ambulatory Visit
Admission: RE | Admit: 2023-07-18 | Discharge: 2023-07-18 | Disposition: A | Discharge status: Home / Self Care | Source: Ambulatory Visit | Attending: Nurse Practitioner

## 2023-07-18 DIAGNOSIS — Z1231 Encounter for screening mammogram for malignant neoplasm of breast: Secondary | ICD-10-CM | POA: Diagnosis not present

## 2023-09-15 ENCOUNTER — Other Ambulatory Visit: Payer: Self-pay | Admitting: Nurse Practitioner

## 2023-10-03 ENCOUNTER — Ambulatory Visit (INDEPENDENT_AMBULATORY_CARE_PROVIDER_SITE_OTHER)

## 2023-10-03 VITALS — BP 126/70 | HR 58 | Temp 97.8°F | Ht 67.0 in | Wt 190.6 lb

## 2023-10-03 DIAGNOSIS — Z23 Encounter for immunization: Secondary | ICD-10-CM

## 2023-10-03 DIAGNOSIS — Z0000 Encounter for general adult medical examination without abnormal findings: Secondary | ICD-10-CM | POA: Diagnosis not present

## 2023-10-03 NOTE — Progress Notes (Signed)
 Subjective:   Amy Tapia is a 89 y.o. who presents for a Medicare Wellness preventive visit.  As a reminder, Annual Wellness Visits don't include a physical exam, and some assessments may be limited, especially if this visit is performed virtually. We may recommend an in-person follow-up visit with your provider if needed.  Visit Complete: In person    Persons Participating in Visit: Patient.  AWV Questionnaire: No: Patient Medicare AWV questionnaire was not completed prior to this visit.  Cardiac Risk Factors include: advanced age (>8men, >37 women);dyslipidemia;diabetes mellitus     Objective:    Today's Vitals   10/03/23 1317  BP: 126/70  Pulse: (!) 58  Temp: 97.8 F (36.6 C)  TempSrc: Oral  SpO2: 98%  Weight: 190 lb 9.6 oz (86.5 kg)  Height: 5' 7 (1.702 m)   Body mass index is 29.85 kg/m.     10/03/2023    1:22 PM 04/13/2023   11:20 AM 09/27/2022    9:21 AM 04/21/2021   11:18 AM 03/02/2021   12:03 PM 10/30/2018   10:32 PM 09/22/2016   11:52 AM  Advanced Directives  Does Patient Have a Medical Advance Directive? No No No No No No No   Does patient want to make changes to medical advance directive?    No - Patient declined     Would patient like information on creating a medical advance directive? No - Patient declined Yes (MAU/Ambulatory/Procedural Areas - Information given) No - Patient declined  Yes (MAU/Ambulatory/Procedural Areas - Information given)       Data saved with a previous flowsheet row definition    Current Medications (verified) Outpatient Encounter Medications as of 10/03/2023  Medication Sig   Ascorbic Acid  (VITAMIN C ) 100 MG tablet Take 100 mg by mouth daily.   aspirin  81 MG tablet Take 81 mg by mouth daily.   atenolol  (TENORMIN ) 50 MG tablet TAKE 1 TABLET BY MOUTH 2 TIMES A DAY   cholecalciferol  (VITAMIN D) 1000 UNITS tablet Take 1,000 Units by mouth daily.   Docosahexaenoic Acid (ALGAL OMEGA-3 DHA PO) Take by mouth.   Fish  Oil-Cholecalciferol  (FISH OIL + D3 PO) Take 1,000 mg by mouth daily.   glucosamine-chondroitin 500-400 MG tablet Take by mouth.   latanoprost  (XALATAN ) 0.005 % ophthalmic solution Place 1 drop into both eyes at bedtime.   lisinopril  (ZESTRIL ) 40 MG tablet Take 1 tablet (40 mg total) by mouth daily.   meloxicam  (MOBIC ) 7.5 MG tablet Take 1 tablet (7.5 mg total) by mouth daily. With food   nitroGLYCERIN  (NITROSTAT ) 0.4 MG SL tablet Place 1 tablet (0.4 mg total) under the tongue every 5 (five) minutes as needed for chest pain. No more than 3tabs in 24hrs. Call 911 if pain does not improve   rosuvastatin  (CRESTOR ) 5 MG tablet Take 1 tablet (5 mg total) by mouth 3 (three) times a week.   tiZANidine  (ZANAFLEX ) 4 MG tablet Take 1 tablet (4 mg total) by mouth daily as needed for muscle spasms. Do not take with ambien    vitamin E  100 UNIT capsule Take 100 Units by mouth daily.   zolpidem  (AMBIEN ) 5 MG tablet Take 1 tablet (5 mg total) by mouth at bedtime as needed.   No facility-administered encounter medications on file as of 10/03/2023.    Allergies (verified) Penicillins, Sular [nisoldipine], and Amlodipine besylate   History: Past Medical History:  Diagnosis Date   Arthritis    maybe a little bit in my knees (05/02/2014)   Breast  cancer (HCC) 07/1970   Breast cancer, right breast (HCC)    Glaucoma of both eyes    HLD (hyperlipidemia)    Hypertension    Past Surgical History:  Procedure Laterality Date   ABDOMINAL HYSTERECTOMY  09/1971   BREAST BIOPSY Right 08/1970   CATARACT EXTRACTION W/ INTRAOCULAR LENS IMPLANT Right 04/2013   MASTECTOMY Right 08/1970   TONSILLECTOMY     TYMPANOPLASTY Bilateral ~ 8036   had eardrums patched w/vein from my arms   Family History  Problem Relation Age of Onset   Glaucoma Mother    Hyperthyroidism Mother    Heart disease Brother    Rheumatic fever Sister    Arthritis Sister    Breast cancer Maternal Aunt        in 32's   Social History    Socioeconomic History   Marital status: Married    Spouse name: Not on file   Number of children: Not on file   Years of education: Not on file   Highest education level: Not on file  Occupational History   Not on file  Tobacco Use   Smoking status: Never   Smokeless tobacco: Never  Vaping Use   Vaping status: Never Used  Substance and Sexual Activity   Alcohol use: No   Drug use: No   Sexual activity: Not Currently  Other Topics Concern   Not on file  Social History Narrative   Not on file   Social Drivers of Health   Financial Resource Strain: Low Risk  (10/03/2023)   Overall Financial Resource Strain (CARDIA)    Difficulty of Paying Living Expenses: Not hard at all  Food Insecurity: No Food Insecurity (10/03/2023)   Hunger Vital Sign    Worried About Running Out of Food in the Last Year: Never true    Ran Out of Food in the Last Year: Never true  Transportation Needs: No Transportation Needs (10/03/2023)   PRAPARE - Administrator, Civil Service (Medical): No    Lack of Transportation (Non-Medical): No  Physical Activity: Inactive (10/03/2023)   Exercise Vital Sign    Days of Exercise per Week: 0 days    Minutes of Exercise per Session: 0 min  Stress: No Stress Concern Present (10/03/2023)   [employer] of Occupational Health - Occupational Stress Questionnaire    Feeling of Stress: Not at all  Social Connections: Moderately Integrated (10/03/2023)   Social Connection and Isolation Panel    Frequency of Communication with Friends and Family: More than three times a week    Frequency of Social Gatherings with Friends and Family: Once a week    Attends Religious Services: More than 4 times per year    Active Member of [employer] or Organizations: No    Attends Engineer, structural: Never    Marital Status: Married    Tobacco Counseling Counseling given: Not Answered    Clinical Intake:  Pre-visit preparation completed: Yes  Pain :  No/denies pain     Nutritional Status: BMI 25 -29 Overweight Nutritional Risks: None Diabetes: Yes CBG done?: No Did pt. bring in CBG monitor from home?: No  Lab Results  Component Value Date   HGBA1C 6.4 04/05/2023   HGBA1C 6.4 09/22/2022   HGBA1C 6.4 03/17/2022     How often do you need to have someone help you when you read instructions, pamphlets, or other written materials from your doctor or pharmacy?: 1 - Never  Interpreter Needed?: No  Information  entered by :: NAllen LPN   Activities of Daily Living     10/03/2023    1:18 PM  In your present state of health, do you have any difficulty performing the following activities:  Hearing? 1  Comment has hearing aids  Vision? 0  Difficulty concentrating or making decisions? 0  Walking or climbing stairs? 0  Dressing or bathing? 0  Doing errands, shopping? 0  Preparing Food and eating ? N  Using the Toilet? N  In the past six months, have you accidently leaked urine? Y  Do you have problems with loss of bowel control? N  Managing your Medications? N  Managing your Finances? N  Housekeeping or managing your Housekeeping? N    Patient Care Team: Nche, Roselie Rockford, NP as PCP - General (Internal Medicine) Santo Stanly LABOR, MD as PCP - Cardiology (Cardiology) Cleatus Collar, MD as Consulting Physician (Ophthalmology)  I have updated your Care Teams any recent Medical Services you may have received from other providers in the past year.     Assessment:   This is a routine wellness examination for [place].  Hearing/Vision screen Hearing Screening - Comments:: Has hearing aids that are maintained Vision Screening - Comments:: Regular eye exams, [facility]   Goals Addressed             This Visit's Progress    Patient Stated       10/03/2023, denies goals       Depression Screen     10/03/2023    1:23 PM 11/09/2022   11:56 AM 09/27/2022    9:24 AM 09/22/2022    9:18 AM 03/17/2022   10:15 AM  12/16/2021   11:57 AM 04/21/2021   11:19 AM  PHQ 2/9 Scores  PHQ - 2 Score 0 0 0 0 0 0 0  PHQ- 9 Score   0 1 3 2      Fall Risk     10/03/2023    1:22 PM 11/09/2022   11:56 AM 09/27/2022    9:23 AM 09/22/2022    9:18 AM 03/17/2022    9:33 AM  Fall Risk   Falls in the past year? 0 0 0 0 0  Number falls in past yr: 0 0 0 0 0  Injury with Fall? 0 0 0 0 0  Risk for fall due to : Medication side effect No Fall Risks Medication side effect No Fall Risks No Fall Risks  Follow up Falls evaluation completed;Falls prevention discussed Falls evaluation completed Falls prevention discussed;Falls evaluation completed Falls evaluation completed Falls evaluation completed    MEDICARE RISK AT HOME:  Medicare Risk at Home Any stairs in or around the home?: Yes If so, are there any without handrails?: No Home free of loose throw rugs in walkways, pet beds, electrical cords, etc?: Yes Adequate lighting in your home to reduce risk of falls?: Yes Life alert?: No Use of a cane, walker or w/c?: No Grab bars in the bathroom?: No Shower chair or bench in shower?: No Elevated toilet seat or a handicapped toilet?: No  TIMED UP AND GO:  Was the test performed?  Yes  Length of time to ambulate 10 feet: 5 sec Gait steady and fast without use of assistive device  Cognitive Function: 6CIT completed        10/03/2023    1:23 PM 09/27/2022    9:25 AM  6CIT Screen  What Year? 0 points 0 points  What month? 0 points 0 points  What time? 0 points 0 points  Count back from 20 0 points 0 points  Months in reverse 0 points 0 points  Repeat phrase 0 points 2 points  Total Score 0 points 2 points    Immunizations Immunization History  Administered Date(s) Administered   Fluad Quad(high Dose 65+) 09/12/2018, 12/16/2021   Fluad Trivalent(High Dose 65+) 09/22/2022   INFLUENZA, HIGH DOSE SEASONAL PF 10/31/2014, 12/02/2015, 11/11/2016, 09/12/2018, 10/03/2023   Influenza, Quadrivalent, Recombinant, Inj, Pf  11/24/2017, 11/03/2018   Influenza,inj,Quad PF,6+ Mos 10/11/2013   Influenza-Unspecified 10/11/2013, 10/11/2017   PFIZER(Purple Top)SARS-COV-2 Vaccination 03/11/2019, 04/04/2019   Pneumococcal Conjugate-13 05/19/2017   Pneumococcal Polysaccharide-23 01/03/2007    Screening Tests Health Maintenance  Topic Date Due   Zoster Vaccines- Shingrix (1 of 2) Never done   FOOT EXAM  03/17/2023   COVID-19 Vaccine (3 - 2025-26 season) 09/12/2023   HEMOGLOBIN A1C  10/06/2023   OPHTHALMOLOGY EXAM  05/30/2024   Medicare Annual Wellness (AWV)  10/02/2024   Pneumococcal Vaccine: 50+ Years  Completed   Influenza Vaccine  Completed   DEXA SCAN  Completed   HPV VACCINES  Aged Out   Meningococcal B Vaccine  Aged Out   DTaP/Tdap/Td  Discontinued    Health Maintenance Items Addressed: Influenza vaccine given, Due for shingles and covid vaccine  Additional Screening:  Vision Screening: Recommended annual ophthalmology exams for early detection of glaucoma and other disorders of the eye. Is the patient up to date with their annual eye exam?  Yes  Who is the provider or what is the name of the office in which the patient attends annual eye exams? [facility]  Dental Screening: Recommended annual dental exams for proper oral hygiene  Community Resource Referral / Chronic Care Management: CRR required this visit?  No   CCM required this visit?  No   Plan:    I have personally reviewed and noted the following in the patient's chart:   Medical and social history Use of alcohol, tobacco or illicit drugs  Current medications and supplements including opioid prescriptions. Patient is not currently taking opioid prescriptions. Functional ability and status Nutritional status Physical activity Advanced directives List of other physicians Hospitalizations, surgeries, and ER visits in previous 12 months Vitals Screenings to include cognitive, depression, and falls Referrals and  appointments  In addition, I have reviewed and discussed with patient certain preventive protocols, quality metrics, and best practice recommendations. A written personalized care plan for preventive services as well as general preventive health recommendations were provided to patient.   Ardella FORBES Dawn, LPN   [id number]   After Visit Summary: (In Person-Declined) Patient declined AVS at this time.  Notes: Nothing significant to report at this time.

## 2023-10-03 NOTE — Patient Instructions (Signed)
 Amy Tapia,  Thank you for taking the time for your Medicare Wellness Visit. I appreciate your continued commitment to your health goals. Please review the care plan we discussed, and feel free to reach out if I can assist you further.  Medicare recommends these wellness visits once per year to help you and your care team stay ahead of potential health issues. These visits are designed to focus on prevention, allowing your provider to concentrate on managing your acute and chronic conditions during your regular appointments.  Please note that Annual Wellness Visits do not include a physical exam. Some assessments may be limited, especially if the visit was conducted virtually. If needed, we may recommend a separate in-person follow-up with your provider.  Ongoing Care Seeing your primary care provider every 3 to 6 months helps us  monitor your health and provide consistent, personalized care.   Referrals If a referral was made during today's visit and you haven't received any updates within two weeks, please contact the referred provider directly to check on the status.  Recommended Screenings:  Health Maintenance  Topic Date Due   Zoster (Shingles) Vaccine (1 of 2) Never done   Complete foot exam   03/17/2023   COVID-19 Vaccine (3 - 2025-26 season) 09/12/2023   Hemoglobin A1C  10/06/2023   Eye exam for diabetics  05/30/2024   Medicare Annual Wellness Visit  10/02/2024   Pneumococcal Vaccine for age over 16  Completed   Flu Shot  Completed   DEXA scan (bone density measurement)  Completed   HPV Vaccine  Aged Out   Meningitis B Vaccine  Aged Out   DTaP/Tdap/Td vaccine  Discontinued       10/03/2023    1:22 PM  Advanced Directives  Does Patient Have a Medical Advance Directive? No  Would patient like information on creating a medical advance directive? No - Patient declined   Advance Care Planning is important because it: Ensures you receive medical care that aligns with your  values, goals, and preferences. Provides guidance to your family and loved ones, reducing the emotional burden of decision-making during critical moments.  Vision: Annual vision screenings are recommended for early detection of glaucoma, cataracts, and diabetic retinopathy. These exams can also reveal signs of chronic conditions such as diabetes and high blood pressure.  Dental: Annual dental screenings help detect early signs of oral cancer, gum disease, and other conditions linked to overall health, including heart disease and diabetes.  Please see the attached documents for additional preventive care recommendations.

## 2023-10-27 ENCOUNTER — Ambulatory Visit: Payer: Self-pay

## 2023-10-27 DIAGNOSIS — M7052 Other bursitis of knee, left knee: Secondary | ICD-10-CM | POA: Diagnosis not present

## 2023-10-27 DIAGNOSIS — S83412A Sprain of medial collateral ligament of left knee, initial encounter: Secondary | ICD-10-CM | POA: Diagnosis not present

## 2023-10-27 NOTE — Telephone Encounter (Signed)
 FYI Only or Action Required?: Action required by provider: request for appointment and update on patient condition.  Patient was last seen in primary care on 05/04/2023 by Nche, Amy Rockford, NP.  Called Nurse Triage reporting Leg Injury.  Symptoms began several weeks ago.  Interventions attempted: OTC medications: advil.  Symptoms are: unchanged.  Triage Disposition: See PCP When Office is Open (Within 3 Days)  Patient/caregiver understands and will follow disposition?: Yes  No appointment available with any providers at [facility] this week. Patient disconnected call before appointment could be offered at another location  Copied from CRM #[id number]. Topic: Clinical - Red Word Triage >> Oct 27, 2023 12:35 PM Amy Tapia wrote: Red Word that prompted transfer to Nurse Triage: Pain   Pt stated that she is experiencing pain in her left leg due to falling on it about 2 weeks ago. Pt stated that she thought it was getting better but the pain is still there. Ptwould like to schedule an appt with provider this week if possible. Reason for Disposition  [1] After 2 weeks AND [2] still painful or swollen  Answer Assessment - Initial Assessment Questions 1. MECHANISM: How did the injury happen? (e.g., twisting injury, direct blow)      Slipped on wet grass and fell when her left leg buckled under her 2. ONSET: When did the injury happen? (e.g., minutes, hours ago)      Two weeks ago 3. LOCATION: Where is the injury located?      Left knee pain 4. APPEARANCE of INJURY: What does the injury look like?  (e.g., deformity of leg)     No bruising, redness, with mild swelling 5. SEVERITY: Can you put weight on that leg? Can you walk?      Able to walk 6. SIZE: For cuts, bruises, or swelling, ask: How large is it? (e.g., inches or centimeters)      No broken skin 7. PAIN: Is there pain? If Yes, ask: How bad is the pain?   What does it keep you from doing? (Scale 0-10; or none,  mild, moderate, severe)     9/10 8. TETANUS: For any breaks in the skin, ask: When was your last tetanus booster?     N/a 9. OTHER SYMPTOMS: Do you have any other symptoms?      none 10. PREGNANCY: Is there any chance you are pregnant? When was your last menstrual period?       N/a  Protocols used: Leg Injury-A-AH

## 2023-10-31 NOTE — Telephone Encounter (Signed)
 Copied from CRM #[id number]. Topic: General - Other >> Oct 31, 2023 10:26 AM Vena HERO wrote: Reason for CRM: pt returning [place] phone call and states she is available for call back anytime

## 2023-10-31 NOTE — Telephone Encounter (Signed)
 Returned call to patient Amy Tapia. She thanked me for calling to check in on her and trying to get her scheduled for an appointment. She informed me that after speaking to the nurse she laid down for a bit and noticed that she was still having the pain so she decided to walk into the orthopaedics. She stated that the doctor did an x-ray and gave her some medicine and told to follow up with PCP if not better. Amy Tapia stated that her leg is feeling somewhat better and that she will see us  on Thursday. Informed patient that if the pain worsen or if she develops new or worsen symptoms like leg swelling, redness that spreads becomes warm to touch and/or difficulty walking to please go to the nearest ED or urgent care for evaluation. She verbalized understanding and all (if any) questions were answered.

## 2023-10-31 NOTE — Telephone Encounter (Signed)
 Called the number listed for patient and her husband Dekayla Prestridge answered. Confirmed spouse is on DPR and I informed him that I was calling to check on Amy Tapia and her leg. He stated that she is sleeping and has been having a hard time. He asked if I could call her back later or he will have her give me call back since she is sleeping.

## 2023-11-03 ENCOUNTER — Ambulatory Visit: Admitting: Nurse Practitioner

## 2023-11-03 ENCOUNTER — Encounter: Payer: Self-pay | Admitting: Nurse Practitioner

## 2023-11-03 VITALS — BP 138/74 | HR 60 | Temp 97.6°F | Ht 67.0 in | Wt 189.0 lb

## 2023-11-03 DIAGNOSIS — F5101 Primary insomnia: Secondary | ICD-10-CM | POA: Diagnosis not present

## 2023-11-03 DIAGNOSIS — I10 Essential (primary) hypertension: Secondary | ICD-10-CM

## 2023-11-03 DIAGNOSIS — E1169 Type 2 diabetes mellitus with other specified complication: Secondary | ICD-10-CM | POA: Diagnosis not present

## 2023-11-03 DIAGNOSIS — E039 Hypothyroidism, unspecified: Secondary | ICD-10-CM

## 2023-11-03 LAB — COMPREHENSIVE METABOLIC PANEL WITH GFR
ALT: 9 U/L (ref 0–35)
AST: 12 U/L (ref 0–37)
Albumin: 4 g/dL (ref 3.5–5.2)
Alkaline Phosphatase: 58 U/L (ref 39–117)
BUN: 21 mg/dL (ref 6–23)
CO2: 28 meq/L (ref 19–32)
Calcium: 9 mg/dL (ref 8.4–10.5)
Chloride: 106 meq/L (ref 96–112)
Creatinine, Ser: 0.96 mg/dL (ref 0.40–1.20)
GFR: 53.06 mL/min — ABNORMAL LOW (ref >60.00)
Glucose, Bld: 124 mg/dL — ABNORMAL HIGH (ref 70–99)
Potassium: 4.6 meq/L (ref 3.5–5.1)
Sodium: 141 meq/L (ref 135–145)
Total Bilirubin: 0.5 mg/dL (ref 0.2–1.2)
Total Protein: 6.9 g/dL (ref 6.0–8.3)

## 2023-11-03 LAB — POCT GLYCOSYLATED HEMOGLOBIN (HGB A1C): Hemoglobin A1C: 5.9 % — AB (ref 4.0–5.6)

## 2023-11-03 LAB — TSH: TSH: 2.45 u[IU]/mL (ref 0.35–5.50)

## 2023-11-03 MED ORDER — BELSOMRA 5 MG PO TABS: 5.0000 mg | ORAL_TABLET | Freq: Every evening | ORAL | 2 refills | Status: DC | PRN

## 2023-11-03 NOTE — Progress Notes (Signed)
 Established Patient Visit  Patient: Amy Tapia   DOB: 06-Jul-1933   89 y.o. Female  MRN: [id number] Visit Date: 11/06/2023  Subjective:    Chief Complaint  Patient presents with   Follow-up    FASTING 6 month follow up HTN, Hyperlipidemia   HPI Insomnia Unable to maintain sleep with ambien  5mg . Premature waking and daytime somnolence. No caffeine, no ALCOHOL  Stop ambien  Switch to belsomra 5mg  F/iup in prn  Primary hypertension BP at goal with atenolol  50mg  BID and lisinopril  40mg  every day. BP Readings from Last 3 Encounters:  11/03/23 138/74  10/03/23 126/70  05/04/23 120/70    Maintain med doses Repeat CMP due to current use of mobic  15mg  prescribed by ortho Advised to avoid OVER THE COUNTER NSAIDs F/up in 6months  Hypothyroid Repeat Tsh: normal  DM (diabetes mellitus) (HCC) Diet controlled BP at goal LDL at goal with crestor  No retinopathy or neuropathy or nephropathy Repeat hgbA1cat 5.9% improved  Reviewed medical, surgical, and social history today  Medications: Outpatient Medications Prior to Visit  Medication Sig   Ascorbic Acid  (VITAMIN C ) 100 MG tablet Take 100 mg by mouth daily.   aspirin  81 MG tablet Take 81 mg by mouth daily.   atenolol  (TENORMIN ) 50 MG tablet TAKE 1 TABLET BY MOUTH 2 TIMES A DAY   cholecalciferol  (VITAMIN D) 1000 UNITS tablet Take 1,000 Units by mouth daily.   Docosahexaenoic Acid (ALGAL OMEGA-3 DHA PO) Take by mouth.   Fish Oil-Cholecalciferol  (FISH OIL + D3 PO) Take 1,000 mg by mouth daily.   glucosamine-chondroitin 500-400 MG tablet Take by mouth.   latanoprost  (XALATAN ) 0.005 % ophthalmic solution Place 1 drop into both eyes at bedtime.   lisinopril  (ZESTRIL ) 40 MG tablet Take 1 tablet (40 mg total) by mouth daily.   meloxicam  (MOBIC ) 15 MG tablet Take 15 mg by mouth daily.   nitroGLYCERIN  (NITROSTAT ) 0.4 MG SL tablet Place 1 tablet (0.4 mg total) under the tongue every 5 (five) minutes as needed for chest  pain. No more than 3tabs in 24hrs. Call 911 if pain does not improve   rosuvastatin  (CRESTOR ) 5 MG tablet Take 1 tablet (5 mg total) by mouth 3 (three) times a week.   tiZANidine  (ZANAFLEX ) 4 MG tablet Take 1 tablet (4 mg total) by mouth daily as needed for muscle spasms. Do not take with ambien    vitamin E  100 UNIT capsule Take 100 Units by mouth daily.   [DISCONTINUED] zolpidem  (AMBIEN ) 5 MG tablet Take 1 tablet (5 mg total) by mouth at bedtime as needed.   [DISCONTINUED] meloxicam  (MOBIC ) 7.5 MG tablet Take 1 tablet (7.5 mg total) by mouth daily. With food (Patient not taking: Reported on 11/03/2023)   No facility-administered medications prior to visit.   Reviewed past medical and social history.   ROS per HPI above      Objective:  BP 138/74 (BP Location: Left Arm, Patient Position: Sitting, Cuff Size: Large)   Pulse 60   Temp 97.6 F (36.4 C) (Oral)   Ht 5' 7 (1.702 m)   Wt 189 lb (85.7 kg)   SpO2 97%   BMI 29.60 kg/m      Physical Exam Cardiovascular:     Rate and Rhythm: Normal rate and regular rhythm.     Pulses: Normal pulses.     Heart sounds: Murmur heard.  Pulmonary:     Effort: Pulmonary effort is normal.  Breath sounds: Normal breath sounds.  Neurological:     Mental Status: She is alert and oriented to person, place, and time.     Results for orders placed or performed in visit on 11/03/23  Comprehensive metabolic panel with GFR  Result Value Ref Range   Sodium 141 135 - 145 mEq/L   Potassium 4.6 3.5 - 5.1 mEq/L   Chloride 106 96 - 112 mEq/L   CO2 28 19 - 32 mEq/L   Glucose, Bld 124 (H) 70 - 99 mg/dL   BUN 21 6 - 23 mg/dL   Creatinine, Ser 9.03 0.40 - 1.20 mg/dL   Total Bilirubin 0.5 0.2 - 1.2 mg/dL   Alkaline Phosphatase 58 39 - 117 U/L   AST 12 0 - 37 U/L   ALT 9 0 - 35 U/L   Total Protein 6.9 6.0 - 8.3 g/dL   Albumin 4.0 3.5 - 5.2 g/dL   GFR 46.93 (L) >39.99 mL/min   Calcium  9.0 8.4 - 10.5 mg/dL  TSH  Result Value Ref Range   TSH 2.45  0.35 - 5.50 uIU/mL  POCT glycosylated hemoglobin (Hb A1C)  Result Value Ref Range   Hemoglobin A1C 5.9 (A) 4.0 - 5.6 %   HbA1c POC (<> result, manual entry)     HbA1c, POC (prediabetic range)     HbA1c, POC (controlled diabetic range)        Assessment & Plan:    Problem List Items Addressed This Visit     DM (diabetes mellitus) (HCC) - Primary   Diet controlled BP at goal LDL at goal with crestor  No retinopathy or neuropathy or nephropathy Repeat hgbA1cat 5.9% improved      Relevant Orders   POCT glycosylated hemoglobin (Hb A1C) (Completed)   Comprehensive metabolic panel with GFR (Completed)   Hypothyroid   Repeat Tsh: normal      Relevant Orders   TSH (Completed)   Insomnia   Unable to maintain sleep with ambien  5mg . Premature waking and daytime somnolence. No caffeine, no ALCOHOL  Stop ambien  Switch to belsomra 5mg  F/iup in prn      Relevant Medications   Suvorexant (BELSOMRA) 5 MG TABS   Primary hypertension   BP at goal with atenolol  50mg  BID and lisinopril  40mg  every day. BP Readings from Last 3 Encounters:  11/03/23 138/74  10/03/23 126/70  05/04/23 120/70    Maintain med doses Repeat CMP due to current use of mobic  15mg  prescribed by ortho Advised to avoid OVER THE COUNTER NSAIDs F/up in 6months      Relevant Orders   Comprehensive metabolic panel with GFR (Completed)   Return in about 6 months (around 05/03/2024) for HTN, DM, hyperlipidemia (fasting).     Roselie Mood, NP

## 2023-11-03 NOTE — Patient Instructions (Signed)
 Go to lab Maintain Heart healthy diet and daily exercise. Stop ambien  start belsomra for sleep. Maintain other medications.

## 2023-11-06 ENCOUNTER — Ambulatory Visit: Payer: Self-pay | Admitting: Nurse Practitioner

## 2023-11-06 ENCOUNTER — Encounter: Payer: Self-pay | Admitting: Nurse Practitioner

## 2023-11-06 DIAGNOSIS — N289 Disorder of kidney and ureter, unspecified: Secondary | ICD-10-CM

## 2023-11-06 NOTE — Assessment & Plan Note (Signed)
 BP at goal with atenolol  50mg  BID and lisinopril  40mg  every day. BP Readings from Last 3 Encounters:  11/03/23 138/74  10/03/23 126/70  05/04/23 120/70    Maintain med doses Repeat CMP due to current use of mobic  15mg  prescribed by ortho Advised to avoid OVER THE COUNTER NSAIDs F/up in 6months

## 2023-11-06 NOTE — Assessment & Plan Note (Signed)
 Unable to maintain sleep with ambien  5mg . Premature waking and daytime somnolence. No caffeine, no ALCOHOL  Stop ambien  Switch to belsomra 5mg  F/iup in prn

## 2023-11-06 NOTE — Assessment & Plan Note (Addendum)
 Diet controlled BP at goal LDL at goal with crestor  No retinopathy or neuropathy or nephropathy Repeat [id number] 5.9% improved

## 2023-11-06 NOTE — Assessment & Plan Note (Signed)
 Repeat Tsh: normal

## 2023-11-09 ENCOUNTER — Other Ambulatory Visit (HOSPITAL_COMMUNITY): Payer: Self-pay

## 2023-11-09 ENCOUNTER — Telehealth: Payer: Self-pay

## 2023-11-09 DIAGNOSIS — F5101 Primary insomnia: Secondary | ICD-10-CM

## 2023-11-09 MED ORDER — TRAZODONE HCL 50 MG PO TABS: 50.0000 mg | ORAL_TABLET | Freq: Every evening | ORAL | 5 refills | Status: DC | PRN

## 2023-11-09 NOTE — Addendum Note (Signed)
 Addended by: KATHEEN GARDEN L on: 11/09/2023 03:21 PM   Modules accepted: Orders

## 2023-11-09 NOTE — Telephone Encounter (Signed)
 Pharmacy Patient Advocate Encounter   Received notification from Onbase that prior authorization for Belsomra 5 mg tablets is required/requested.   Insurance verification completed.   The patient is insured through [employer].   Per test claim: Per test claim, medication is not covered due to plan/benefit exclusion, PA not submitted at this time    *please consider changing therapies

## 2023-11-09 NOTE — Telephone Encounter (Signed)
 Called patient and informed her that her insurance will not cover the Belsomra and that Okay sent in to her pharmacy Trazodone 50 mg to be taken at bedtime as needed for sleep. She thanked me for calling with the new information. She verbalized understanding and all (if any) questions were answered.

## 2023-12-01 ENCOUNTER — Other Ambulatory Visit

## 2023-12-01 ENCOUNTER — Ambulatory Visit: Payer: Self-pay | Admitting: Nurse Practitioner

## 2023-12-01 DIAGNOSIS — N289 Disorder of kidney and ureter, unspecified: Secondary | ICD-10-CM

## 2023-12-01 LAB — RENAL FUNCTION PANEL
Albumin: 3.7 g/dL (ref 3.5–5.2)
BUN: 16 mg/dL (ref 6–23)
CO2: 29 meq/L (ref 19–32)
Calcium: 9 mg/dL (ref 8.4–10.5)
Chloride: 106 meq/L (ref 96–112)
Creatinine, Ser: 0.89 mg/dL (ref 0.40–1.20)
GFR: 58.08 mL/min — ABNORMAL LOW (ref >60.00)
Glucose, Bld: 131 mg/dL — ABNORMAL HIGH (ref 70–99)
Phosphorus: 3.6 mg/dL (ref 2.3–4.6)
Potassium: 4.1 meq/L (ref 3.5–5.1)
Sodium: 141 meq/L (ref 135–145)

## 2023-12-02 NOTE — Telephone Encounter (Signed)
 Copied from CRM [id number]. Topic: Clinical - Lab/Test Results >> Dec 02, 2023  3:10 PM Viola F wrote: Reason for CRM: Patient returned Zoiey Christy's phone call, I relayed the message that the renal function is stable. Patient had no further questions.

## 2023-12-05 ENCOUNTER — Ambulatory Visit (INDEPENDENT_AMBULATORY_CARE_PROVIDER_SITE_OTHER): Admitting: Nurse Practitioner

## 2023-12-05 ENCOUNTER — Encounter: Payer: Self-pay | Admitting: Nurse Practitioner

## 2023-12-05 VITALS — BP 122/68 | HR 54 | Temp 97.6°F | Ht 67.0 in | Wt 190.6 lb

## 2023-12-05 DIAGNOSIS — F5101 Primary insomnia: Secondary | ICD-10-CM | POA: Diagnosis not present

## 2023-12-05 NOTE — Progress Notes (Signed)
 Established Patient Visit  Patient: Amy Tapia   DOB: 12-27-1933   89 y.o. Female  MRN: [id number] Visit Date: 12/05/2023  Subjective:    Chief Complaint  Patient presents with   Insomnia    Can't sleep for a while now and I don't want to take the medicine for depression    Insomnia   Insomnia Belsomra  not covered by insurance She is concerned about possible side effects of trazodone , so she did not start medication. Unable to tolerate OVER THE COUNTER sleep aid-daytime drowsiness No snoring, no apnea. Coffee: 1cup in Am and 1cup at noon No soda, No ALCOHOL, no tobacco/nicotine  We discussed pros and cons of any sleep aid. Advised to start trazodone  25-50mg  at hs prn. Also advised to decrease caffeine to 1cup 8oz in AM only. F/up prn  Reviewed medical, surgical, and social history today  Medications: Outpatient Medications Prior to Visit  Medication Sig   Ascorbic Acid  (VITAMIN C ) 100 MG tablet Take 100 mg by mouth daily.   aspirin  81 MG tablet Take 81 mg by mouth daily.   atenolol  (TENORMIN ) 50 MG tablet TAKE 1 TABLET BY MOUTH 2 TIMES A DAY   cholecalciferol  (VITAMIN D) 1000 UNITS tablet Take 1,000 Units by mouth daily.   Docosahexaenoic Acid (ALGAL OMEGA-3 DHA PO) Take by mouth.   Fish Oil-Cholecalciferol  (FISH OIL + D3 PO) Take 1,000 mg by mouth daily.   glucosamine-chondroitin 500-400 MG tablet Take by mouth.   latanoprost  (XALATAN ) 0.005 % ophthalmic solution Place 1 drop into both eyes at bedtime.   lisinopril  (ZESTRIL ) 40 MG tablet Take 1 tablet (40 mg total) by mouth daily.   meloxicam  (MOBIC ) 15 MG tablet Take 15 mg by mouth daily.   nitroGLYCERIN  (NITROSTAT ) 0.4 MG SL tablet Place 1 tablet (0.4 mg total) under the tongue every 5 (five) minutes as needed for chest pain. No more than 3tabs in 24hrs. Call 911 if pain does not improve   rosuvastatin  (CRESTOR ) 5 MG tablet Take 1 tablet (5 mg total) by mouth 3 (three) times a week.   tiZANidine   (ZANAFLEX ) 4 MG tablet Take 1 tablet (4 mg total) by mouth daily as needed for muscle spasms. Do not take with ambien    traZODone  (DESYREL ) 50 MG tablet Take 1 tablet (50 mg total) by mouth at bedtime as needed for sleep.   vitamin E  100 UNIT capsule Take 100 Units by mouth daily.   No facility-administered medications prior to visit.   Reviewed past medical and social history.   ROS per HPI above      Objective:  BP 122/68 (BP Location: Left Arm, Patient Position: Sitting, Cuff Size: Large)   Pulse (!) 54   Temp 97.6 F (36.4 C) (Oral)   Ht 5' 7 (1.702 m)   Wt 190 lb 9.6 oz (86.5 kg)   SpO2 96%   BMI 29.85 kg/m      Physical Exam Vitals and nursing note reviewed.  Cardiovascular:     Rate and Rhythm: Normal rate.     Pulses: Normal pulses.  Pulmonary:     Effort: Pulmonary effort is normal.  Neurological:     Mental Status: She is alert and oriented to person, place, and time.  Psychiatric:        Mood and Affect: Mood normal.        Behavior: Behavior normal.        Thought  Content: Thought content normal.     No results found for any visits on 12/05/23.    Assessment & Plan:    Problem List Items Addressed This Visit     Insomnia - Primary   Belsomra  not covered by insurance She is concerned about possible side effects of trazodone , so she did not start medication. Unable to tolerate OVER THE COUNTER sleep aid-daytime drowsiness No snoring, no apnea. Coffee: 1cup in Am and 1cup at noon No soda, No ALCOHOL, no tobacco/nicotine  We discussed pros and cons of any sleep aid. Advised to start trazodone  25-50mg  at hs prn. Also advised to decrease caffeine to 1cup 8oz in AM only. F/up prn      Return if symptoms worsen or fail to improve.     Roselie Mood, NP

## 2023-12-05 NOTE — Patient Instructions (Signed)
 Limit coffee to 1cup 8oz in AM only Start trazodone  25-50mg  at bedtime. F/up as scheduled

## 2023-12-05 NOTE — Assessment & Plan Note (Signed)
 Belsomra  not covered by insurance She is concerned about possible side effects of trazodone , so she did not start medication. Unable to tolerate OVER THE COUNTER sleep aid-daytime drowsiness No snoring, no apnea. Coffee: 1cup in Am and 1cup at noon No soda, No ALCOHOL, no tobacco/nicotine  We discussed pros and cons of any sleep aid. Advised to start trazodone  25-50mg  at hs prn. Also advised to decrease caffeine to 1cup 8oz in AM only. F/up prn

## 2023-12-13 ENCOUNTER — Emergency Department (HOSPITAL_BASED_OUTPATIENT_CLINIC_OR_DEPARTMENT_OTHER)

## 2023-12-13 ENCOUNTER — Other Ambulatory Visit: Payer: Self-pay

## 2023-12-13 ENCOUNTER — Emergency Department (HOSPITAL_BASED_OUTPATIENT_CLINIC_OR_DEPARTMENT_OTHER)
Admission: EM | Admit: 2023-12-13 | Discharge: 2023-12-13 | Disposition: A | Discharge status: Home / Self Care | Attending: Emergency Medicine | Admitting: Emergency Medicine

## 2023-12-13 ENCOUNTER — Encounter (HOSPITAL_BASED_OUTPATIENT_CLINIC_OR_DEPARTMENT_OTHER): Payer: Self-pay

## 2023-12-13 DIAGNOSIS — R059 Cough, unspecified: Secondary | ICD-10-CM | POA: Insufficient documentation

## 2023-12-13 DIAGNOSIS — Z7982 Long term (current) use of aspirin: Secondary | ICD-10-CM | POA: Insufficient documentation

## 2023-12-13 DIAGNOSIS — Z79899 Other long term (current) drug therapy: Secondary | ICD-10-CM | POA: Diagnosis not present

## 2023-12-13 DIAGNOSIS — I2510 Atherosclerotic heart disease of native coronary artery without angina pectoris: Secondary | ICD-10-CM | POA: Insufficient documentation

## 2023-12-13 DIAGNOSIS — E119 Type 2 diabetes mellitus without complications: Secondary | ICD-10-CM | POA: Diagnosis not present

## 2023-12-13 DIAGNOSIS — I10 Essential (primary) hypertension: Secondary | ICD-10-CM | POA: Diagnosis not present

## 2023-12-13 DIAGNOSIS — R0602 Shortness of breath: Secondary | ICD-10-CM | POA: Diagnosis not present

## 2023-12-13 LAB — RESP PANEL BY RT-PCR (RSV, FLU A&B, COVID)  RVPGX2
Influenza A by PCR: NEGATIVE
Influenza B by PCR: NEGATIVE
Resp Syncytial Virus by PCR: NEGATIVE
SARS Coronavirus 2 by RT PCR: NEGATIVE

## 2023-12-13 MED ORDER — BENZONATATE 100 MG PO CAPS: 100.0000 mg | ORAL_CAPSULE | Freq: Three times a day (TID) | ORAL | 0 refills | Status: AC

## 2023-12-13 NOTE — ED Triage Notes (Signed)
 Cough and congestion x 1 week Coughing up yellow green mucous.  Mild SOB Pt is hoarse. Nasal drainage . Stomach and back pain but attributes that to coughing. Denies N/V/d

## 2023-12-13 NOTE — Discharge Instructions (Addendum)
 Continue Tylenol  as needed for headache/body aches/fever.  Start Tessalon Perles, take 1 by mouth every 8 hours as needed for cough.  You may continue other over-the-counter cough agents, like DayQuil, for continued relief of your symptoms.  Return to the emergency department if your symptoms worsen or if you develop chest pain or shortness of breath.  Follow-up with your PCP.

## 2023-12-13 NOTE — ED Provider Notes (Signed)
 [place] [facility] Provider Note   CSN: [id number] Arrival date & time: 12/13/23  9045     Patient presents with: Cough   Amy Tapia is a 89 y.o. female.   89 year old female presenting with cold symptoms.  Patient notes that she became symptomatic last Tuesday, complains of symptoms like a mildly productive cough, nasal congestion/drainage, voice hoarseness, and low-grade temps at home reaching a Tmax of 99.7.  She has been managing her symptoms with DayQuil/NyQuil as well as Tylenol  as needed.  She feels that her symptoms are improving but reports that her husband is similarly ill and decided to get checked out while he is being checked out today. She notes that she has some abdominal/lower back soreness which she attributes to episodes of coughing, she denies nausea/vomiting/diarrhea, denies chest pain/shortness of breath.  No history of COPD or other lung diseases.   Cough      Prior to Admission medications   Medication Sig Start Date End Date Taking? Authorizing Provider  Ascorbic Acid  (VITAMIN C ) 100 MG tablet Take 100 mg by mouth daily.    [provider]  aspirin  81 MG tablet Take 81 mg by mouth daily.    [provider]  atenolol  (TENORMIN ) 50 MG tablet TAKE 1 TABLET BY MOUTH 2 TIMES A DAY 09/16/23   Nche, Roselie Rockford, NP  cholecalciferol  (VITAMIN D) 1000 UNITS tablet Take 1,000 Units by mouth daily.    [provider]  Docosahexaenoic Acid (ALGAL OMEGA-3 DHA PO) Take by mouth. 05/07/19   [provider]  Fish Oil-Cholecalciferol  (FISH OIL + D3 PO) Take 1,000 mg by mouth daily.    [provider]  glucosamine-chondroitin 500-400 MG tablet Take by mouth. 05/07/19   [provider]  latanoprost  (XALATAN ) 0.005 % ophthalmic solution Place 1 drop into both eyes at bedtime.    [provider]  lisinopril  (ZESTRIL ) 40 MG tablet Take 1 tablet (40 mg total) by mouth daily.  04/22/23 12/05/23  Santo Stanly LABOR, MD  meloxicam  (MOBIC ) 15 MG tablet Take 15 mg by mouth daily. 10/27/23   [provider]  nitroGLYCERIN  (NITROSTAT ) 0.4 MG SL tablet Place 1 tablet (0.4 mg total) under the tongue every 5 (five) minutes as needed for chest pain. No more than 3tabs in 24hrs. Call 911 if pain does not improve 08/18/21   Nche, Roselie Rockford, NP  rosuvastatin  (CRESTOR ) 5 MG tablet Take 1 tablet (5 mg total) by mouth 3 (three) times a week. 06/10/23   Nche, Roselie Rockford, NP  tiZANidine  (ZANAFLEX ) 4 MG tablet Take 1 tablet (4 mg total) by mouth daily as needed for muscle spasms. Do not take with ambien  05/04/23   Nche, Roselie Rockford, NP  traZODone  (DESYREL ) 50 MG tablet Take 1 tablet (50 mg total) by mouth at bedtime as needed for sleep. 11/09/23   Nche, Roselie Rockford, NP  vitamin E  100 UNIT capsule Take 100 Units by mouth daily.    [provider]    Allergies: Nisoldipine, Penicillins, and Amlodipine besylate    Review of Systems  Respiratory:  Positive for cough.     Updated Vital Signs  Vitals:   12/13/23 1014 12/13/23 1022 12/13/23 1145 12/13/23 1145  BP: (!) 135/47  (!) 149/60   Pulse: (!) 55  (!) 50   Resp: 17  20   Temp: 98.1 F (36.7 C)  98.1 F (36.7 C)   TempSrc: Oral  Oral   SpO2: 96%  99%   Weight:  79.4 kg  88.6 kg  Height:    5' 7 (1.702 m)     Physical Exam Vitals and nursing note reviewed.  HENT:     Head: Normocephalic.     Right Ear: Tympanic membrane normal.     Left Ear: Tympanic membrane normal.     Mouth/Throat:     Mouth: Mucous membranes are moist.     Pharynx: Oropharynx is clear. Uvula midline. No pharyngeal swelling or oropharyngeal exudate.     Tonsils: No tonsillar exudate or tonsillar abscesses.     Comments: Voice hoarseness Eyes:     Extraocular Movements: Extraocular movements intact.  Cardiovascular:     Rate and Rhythm: Normal rate and regular rhythm.     Heart sounds: Normal heart sounds.   Pulmonary:     Effort: Pulmonary effort is normal. No respiratory distress.     Breath sounds: Normal breath sounds.     Comments: Speaking in full/clear sentences without evidence of respiratory distress Abdominal:     Palpations: Abdomen is soft.     Tenderness: There is no abdominal tenderness. There is no guarding.  Musculoskeletal:     Cervical back: Normal range of motion.     Comments: Moves all extremities spontaneously without difficulty Back: No midline spinous process tenderness to palpation or step-off/deformity, no paraspinal muscle tenderness on exam  Skin:    General: Skin is warm and dry.  Neurological:     Mental Status: She is alert and oriented to person, place, and time.     (all labs ordered are listed, but only abnormal results are displayed) Labs Reviewed  RESP PANEL BY RT-PCR (RSV, FLU A&B, COVID)  [id number]    EKG: None  Radiology: DG Chest 2 View Result Date: 12/13/2023 CLINICAL DATA:  Cough. EXAM: CHEST - 2 VIEW COMPARISON:  08/18/2021. FINDINGS: The heart size and mediastinal contours are within normal limits. No pulmonary edema, focal consolidation, pleural effusion, or pneumothorax. Degenerative changes of the thoracic spine. No acute osseous abnormality. IMPRESSION: No acute cardiopulmonary findings. Electronically Signed   By: Harrietta Sherry M.D.   On: 12/13/2023 11:43     Procedures   Medications Ordered in the ED - No data to display                                  Medical Decision Making This patient presents to the ED for concern of cold symptoms, this involves an extensive number of treatment options, and is a complaint that carries with it a high risk of complications and morbidity.  The differential diagnosis includes COVID/flu/RSV, other viral upper respiratory illness, pneumonia, acute hypoxic respiratory failure   Co morbidities that complicate the patient evaluation  Hypertension, CAD, diabetes   Additional history  obtained:  Additional history obtained from record review External records from outside source obtained and reviewed including recent PCP note   Lab Tests:  I Ordered, and personally interpreted labs.  The pertinent results include: COVID/flu/RSV negative   Imaging Studies ordered:  I ordered imaging studies including chest x-ray I independently visualized and interpreted imaging which showed No acute cardiopulmonary findings.  I agree with the radiologist interpretation   Cardiac Monitoring: / EKG:  The patient was maintained on a cardiac monitor.  I personally viewed and interpreted the cardiac monitored which showed an underlying rhythm of: Sinus bradycardia   Problem List / ED Course /  Critical interventions / Medication management I have reviewed the patients home medicines and have made adjustments as needed   Social Determinants of Health:  Physical inactivity   Test / Admission - Considered:  Physical exam is largely unremarkable as above, lungs are clear to auscultation bilaterally without adventitious sounds, patient does have vocal hoarseness but her posterior oropharynx is without erythema/swelling/exudate, TMs are without bulging/erythema bilaterally. COVID/flu/RSV testing is negative, chest x-ray is without evidence of infiltrate/consolidation.  Patient's vitals are reassuring, she is without fever and she is maintaining her oxygen saturation on room air without difficulty, she does not demonstrate any signs/symptoms consistent with respiratory distress.  She has no history of COPD or any other lung diseases. At this point, patient feels that her symptoms are improving, she states that she is here today because her husband is being evaluated and she wanted to get checked out too.  I prescribed Tessalon Perles to be used as needed for cough, I recommend that she continue other over-the-counter agents like Tylenol  and cough suppressants as needed.  Strict return  precautions discussed, she and her son voiced understanding and are in agreement this plan, she is appropriate for discharge at this time.  Staffed with Dr. Elnor  Amount and/or Complexity of Data Reviewed Radiology: ordered.  Risk Prescription drug management.        Final diagnoses:  Cough, unspecified type    ED Discharge Orders          Ordered    benzonatate (TESSALON) 100 MG capsule  Every 8 hours        12/13/23 1220               Glendia Rocky SAILOR, PA-C 12/13/23 1315    Elnor Jayson LABOR, DO 12/14/23 [phone]

## 2023-12-13 NOTE — ED Notes (Signed)
 Patient transferred from waiting room to ED treatment room. Assuming pt care at this time.

## 2023-12-21 ENCOUNTER — Telehealth: Payer: Self-pay

## 2023-12-21 ENCOUNTER — Encounter: Payer: Self-pay | Admitting: Nurse Practitioner

## 2023-12-21 ENCOUNTER — Ambulatory Visit: Admitting: Nurse Practitioner

## 2023-12-21 VITALS — BP 138/74 | HR 56 | Temp 97.5°F | Ht 67.0 in | Wt 190.2 lb

## 2023-12-21 DIAGNOSIS — F5101 Primary insomnia: Secondary | ICD-10-CM | POA: Diagnosis not present

## 2023-12-21 DIAGNOSIS — J209 Acute bronchitis, unspecified: Secondary | ICD-10-CM | POA: Diagnosis not present

## 2023-12-21 MED ORDER — ZOLPIDEM TARTRATE 5 MG PO TABS: 5.0000 mg | ORAL_TABLET | Freq: Every evening | ORAL | 2 refills | Status: AC | PRN

## 2023-12-21 MED ORDER — BELSOMRA 5 MG PO TABS: 1.0000 | ORAL_TABLET | Freq: Every day | ORAL | 2 refills | Status: DC

## 2023-12-21 NOTE — Assessment & Plan Note (Addendum)
 No improvement with trazodone  50mg  at hs. She requested for to pay for belsomra  out of pocket. Prescription sent She called office today stating, she is unable to afford out of pocket cost of belsomra . Ambien  5mg  sent Consider referral to sleep clinic.

## 2023-12-21 NOTE — Progress Notes (Signed)
 Acute Office Visit  Subjective:    Patient ID: Amy Tapia, female    DOB: [birth year], 89 y.o.   MRN: [id number]  Chief Complaint  Patient presents with   Follow-up    Recent ED visit  The new sleep medication is not helpful    Cough This is a new problem. The current episode started in the past 7 days. The problem has been gradually improving. The cough is Productive of sputum. Pertinent negatives include no chest pain, chills, ear congestion, ear pain, fever, headaches, heartburn, hemoptysis, myalgias, nasal congestion, postnasal drip, rash, rhinorrhea, sore throat, shortness of breath, sweats, weight loss or wheezing. Nothing aggravates the symptoms. She has tried prescription cough suppressant and OTC cough suppressant for the symptoms. The treatment provided significant relief.   Insomnia No improvement with trazodone  50mg  at hs. She requested for to pay for belsomra  out of pocket. Prescription sent She called office today stating, she is unable to afford out of pocket cost of belsomra . Ambien  5mg  sent Consider referral to sleep clinic.   Outpatient Medications Prior to Visit  Medication Sig Note   Ascorbic Acid  (VITAMIN C ) 100 MG tablet Take 100 mg by mouth daily.    aspirin  81 MG tablet Take 81 mg by mouth daily.    atenolol  (TENORMIN ) 50 MG tablet TAKE 1 TABLET BY MOUTH 2 TIMES A DAY    benzonatate  (TESSALON ) 100 MG capsule Take 1 capsule (100 mg total) by mouth every 8 (eight) hours.    cholecalciferol  (VITAMIN D) 1000 UNITS tablet Take 1,000 Units by mouth daily.    Docosahexaenoic Acid (ALGAL OMEGA-3 DHA PO) Take by mouth.    Fish Oil-Cholecalciferol  (FISH OIL + D3 PO) Take 1,000 mg by mouth daily.    glucosamine-chondroitin 500-400 MG tablet Take by mouth.    latanoprost  (XALATAN ) 0.005 % ophthalmic solution Place 1 drop into both eyes at bedtime.    lisinopril  (ZESTRIL ) 40 MG tablet Take 1 tablet (40 mg total) by mouth daily.    meloxicam  (MOBIC ) 15 MG tablet Take  15 mg by mouth daily.    nitroGLYCERIN  (NITROSTAT ) 0.4 MG SL tablet Place 1 tablet (0.4 mg total) under the tongue every 5 (five) minutes as needed for chest pain. No more than 3tabs in 24hrs. Call 911 if pain does not improve    rosuvastatin  (CRESTOR ) 5 MG tablet Take 1 tablet (5 mg total) by mouth 3 (three) times a week.    tiZANidine  (ZANAFLEX ) 4 MG tablet Take 1 tablet (4 mg total) by mouth daily as needed for muscle spasms. Do not take with ambien     vitamin E  100 UNIT capsule Take 100 Units by mouth daily.    [DISCONTINUED] traZODone  (DESYREL ) 50 MG tablet Take 1 tablet (50 mg total) by mouth at bedtime as needed for sleep. 12/21/2023: ineffective   No facility-administered medications prior to visit.    Reviewed past medical and social history.  Review of Systems  Constitutional:  Negative for chills, fever and weight loss.  HENT:  Negative for ear pain, postnasal drip, rhinorrhea and sore throat.   Respiratory:  Positive for cough. Negative for hemoptysis, shortness of breath and wheezing.   Cardiovascular:  Negative for chest pain.  Gastrointestinal:  Negative for heartburn.  Musculoskeletal:  Negative for myalgias.  Skin:  Negative for rash.  Neurological:  Negative for headaches.   Per HPI     Objective:    Physical Exam Vitals and nursing note reviewed.  Cardiovascular:  Rate and Rhythm: Normal rate and regular rhythm.     Pulses: Normal pulses.     Heart sounds: Normal heart sounds.  Pulmonary:     Effort: Pulmonary effort is normal.     Breath sounds: Normal breath sounds.  Musculoskeletal:     Right lower leg: No edema.     Left lower leg: No edema.  Neurological:     Mental Status: She is alert and oriented to person, place, and time.     BP 138/74 (BP Location: Left Arm, Patient Position: Sitting, Cuff Size: Normal)   Pulse (!) 56   Temp (!) 97.5 F (36.4 C) (Oral)   Ht 5' 7 (1.702 m)   Wt 190 lb 3.2 oz (86.3 kg)   SpO2 97%   BMI 29.79 kg/m     No results found for any visits on 12/21/23.     Assessment & Plan:   Problem List Items Addressed This Visit     Insomnia   No improvement with trazodone  50mg  at hs. She requested for to pay for belsomra  out of pocket. Prescription sent She called office today stating, she is unable to afford out of pocket cost of belsomra . Ambien  5mg  sent Consider referral to sleep clinic.      Relevant Medications   zolpidem  (AMBIEN ) 5 MG tablet   Other Visit Diagnoses       Acute bronchitis, unspecified organism    -  Primary       Meds ordered this encounter  Medications   DISCONTD: Suvorexant  (BELSOMRA ) 5 MG TABS    Sig: Take 1 tablet (5 mg total) by mouth at bedtime.    Dispense:  30 tablet    Refill:  2    D/c trazodone . Patient is willing to pay out of pocket. She is aware belsomra  is not covered by her insurance    Supervising Provider:   BERNETA FALLOW ALFRED [[id number]]   zolpidem  (AMBIEN ) 5 MG tablet    Sig: Take 1 tablet (5 mg total) by mouth at bedtime as needed for sleep.    Dispense:  30 tablet    Refill:  2    D/c belsomra . She is unable to afford out of pocket cost. D/c trazodone -ineffective    Supervising Provider:   BERNETA FALLOW SAYRE [[id number]]   Return if symptoms worsen or fail to improve.    Roselie Mood, NP

## 2023-12-21 NOTE — Telephone Encounter (Signed)
 Patient came back into the office to inform Roselie that the medication she sent to her pharmacy will cost her $500 and that will be to expensive that she wants the Ambien  as discussed at today's appointment. Advised patient that I will make [facility] aware.

## 2023-12-23 NOTE — Telephone Encounter (Signed)
 Called the number listed for patient and her spouse Cortez Steelman who is on DPR answered. I asked him to have his wife give me a call back at the office when she wakes up from her nap.

## 2023-12-23 NOTE — Telephone Encounter (Signed)
 Patient returned my call. I asked if she has started the Ambien  5 mg and how was she doing since restarting. She stated that she has restarted Ambien  and is sleeping better. She also stated that went out in the weather yesterday and is feeling worse than when she was in our office. Informed patient to take all medications as prescribed and if she is not feeling any better over the weekend to give our office a call on Monday or go the nearest urgent care and/or ED. She verbalized understanding and all (if any) were answered.

## 2024-01-30 ENCOUNTER — Other Ambulatory Visit: Payer: Self-pay

## 2024-01-30 ENCOUNTER — Emergency Department (HOSPITAL_COMMUNITY)

## 2024-01-30 ENCOUNTER — Other Ambulatory Visit: Payer: Self-pay | Admitting: Nurse Practitioner

## 2024-01-30 ENCOUNTER — Encounter (HOSPITAL_COMMUNITY): Payer: Self-pay

## 2024-01-30 ENCOUNTER — Emergency Department (HOSPITAL_COMMUNITY)
Admission: EM | Admit: 2024-01-30 | Discharge: 2024-01-31 | Disposition: A | Attending: Emergency Medicine | Admitting: Emergency Medicine

## 2024-01-30 DIAGNOSIS — Z7982 Long term (current) use of aspirin: Secondary | ICD-10-CM | POA: Insufficient documentation

## 2024-01-30 DIAGNOSIS — R519 Headache, unspecified: Secondary | ICD-10-CM | POA: Diagnosis present

## 2024-01-30 DIAGNOSIS — I159 Secondary hypertension, unspecified: Secondary | ICD-10-CM | POA: Diagnosis not present

## 2024-01-30 DIAGNOSIS — Z79899 Other long term (current) drug therapy: Secondary | ICD-10-CM | POA: Diagnosis not present

## 2024-01-30 LAB — COMPREHENSIVE METABOLIC PANEL WITH GFR
ALT: 12 U/L (ref 0–44)
AST: 19 U/L (ref 15–41)
Albumin: 4.2 g/dL (ref 3.5–5.0)
Alkaline Phosphatase: 67 U/L (ref 38–126)
Anion gap: 11 (ref 5–15)
BUN: 13 mg/dL (ref 8–23)
CO2: 27 mmol/L (ref 22–32)
Calcium: 9.4 mg/dL (ref 8.9–10.3)
Chloride: 105 mmol/L (ref 98–111)
Creatinine, Ser: 0.98 mg/dL (ref 0.44–1.00)
GFR, Estimated: 55 mL/min — ABNORMAL LOW
Glucose, Bld: 168 mg/dL — ABNORMAL HIGH (ref 70–99)
Potassium: 4.2 mmol/L (ref 3.5–5.1)
Sodium: 143 mmol/L (ref 135–145)
Total Bilirubin: 0.3 mg/dL (ref 0.0–1.2)
Total Protein: 7.3 g/dL (ref 6.5–8.1)

## 2024-01-30 LAB — I-STAT CHEM 8, ED
BUN: 14 mg/dL (ref 8–23)
Calcium, Ion: 1.08 mmol/L — ABNORMAL LOW (ref 1.15–1.40)
Chloride: 107 mmol/L (ref 98–111)
Creatinine, Ser: 1.1 mg/dL — ABNORMAL HIGH (ref 0.44–1.00)
Glucose, Bld: 167 mg/dL — ABNORMAL HIGH (ref 70–99)
HCT: 42 % (ref 36.0–46.0)
Hemoglobin: 14.3 g/dL (ref 12.0–15.0)
Potassium: 4.1 mmol/L (ref 3.5–5.1)
Sodium: 141 mmol/L (ref 135–145)
TCO2: 24 mmol/L (ref 22–32)

## 2024-01-30 LAB — CBC
HCT: 42 % (ref 36.0–46.0)
Hemoglobin: 13.6 g/dL (ref 12.0–15.0)
MCH: 30.1 pg (ref 26.0–34.0)
MCHC: 32.4 g/dL (ref 30.0–36.0)
MCV: 92.9 fL (ref 80.0–100.0)
Platelets: 280 K/uL (ref 150–400)
RBC: 4.52 MIL/uL (ref 3.87–5.11)
RDW: 13.3 % (ref 11.5–15.5)
WBC: 8.4 K/uL (ref 4.0–10.5)
nRBC: 0 % (ref 0.0–0.2)

## 2024-01-30 LAB — DIFFERENTIAL
Abs Immature Granulocytes: 0.02 K/uL (ref 0.00–0.07)
Basophils Absolute: 0.1 K/uL (ref 0.0–0.1)
Basophils Relative: 1 %
Eosinophils Absolute: 0.4 K/uL (ref 0.0–0.5)
Eosinophils Relative: 5 %
Immature Granulocytes: 0 %
Lymphocytes Relative: 18 %
Lymphs Abs: 1.5 K/uL (ref 0.7–4.0)
Monocytes Absolute: 0.7 K/uL (ref 0.1–1.0)
Monocytes Relative: 8 %
Neutro Abs: 5.7 K/uL (ref 1.7–7.7)
Neutrophils Relative %: 68 %

## 2024-01-30 LAB — APTT: aPTT: 29 s (ref 24–36)

## 2024-01-30 LAB — CBG MONITORING, ED
Glucose-Capillary: 179 mg/dL — ABNORMAL HIGH (ref 70–99)
Glucose-Capillary: 161 mg/dL — ABNORMAL HIGH (ref 70–99)

## 2024-01-30 LAB — PROTIME-INR
INR: 1 (ref 0.8–1.2)
Prothrombin Time: 14 s (ref 11.4–15.2)

## 2024-01-30 LAB — ETHANOL: Alcohol, Ethyl (B): <15 mg/dL

## 2024-01-30 MED ORDER — HYDRALAZINE HCL 20 MG/ML IJ SOLN
5.0000 mg | Freq: Once | INTRAMUSCULAR | Status: DC
  Filled 2024-01-30: qty 1

## 2024-01-30 MED ORDER — ACETAMINOPHEN 325 MG PO TABS
650.0000 mg | ORAL_TABLET | Freq: Once | ORAL | Status: AC
  Administered 2024-01-31: 650 mg via ORAL
  Filled 2024-01-30: qty 2

## 2024-01-30 MED ORDER — LABETALOL HCL 5 MG/ML IV SOLN: 5.0000 mg | Freq: Once | INTRAVENOUS | Status: DC

## 2024-01-30 MED ORDER — SODIUM CHLORIDE 0.9% FLUSH: 3.0000 mL | Freq: Once | INTRAVENOUS | Status: DC

## 2024-01-30 MED ORDER — CLONIDINE HCL 0.2 MG PO TABS: 0.2000 mg | ORAL_TABLET | Freq: Two times a day (BID) | ORAL | 0 refills | Status: AC | PRN

## 2024-01-30 NOTE — ED Triage Notes (Addendum)
 Patient reports headache today around 1800 so she checked her BP at home and it was 245/ something, also states she has been off balance all day today. Patient states she takes lisinopril  and atenolol  for BP and since Christmas it has not been doing a good job controlling her BP. Today is the first time she's been dizzy with her HTN.

## 2024-01-30 NOTE — ED Provider Notes (Signed)
" °  Provider Note MRN:  [id number]  Arrival date & time: 01/31/24    ED Course and Medical Decision Making  Assumed care of patient at sign-out or upon transfer.  Headache and hypertension with reassuring workup plan is for discharge after p.o. meds.  1 AM update: Blood pressure consistently improved from her arrival.  She still expresses some nervousness about the number still being in the 160s to 170s.  We discussed the importance of not being overaggressive and dropping her blood pressure too much.  She is currently not having any headache or chest pain or other concerning symptoms to suggest a hypertensive emergency.  She has a very reassuring workup.  She is provided with reassurance, she has a plan at home with clonidine , she will follow-up with her primary care doctor.  No emergent process plan is for discharge.  Procedures  Final Clinical Impressions(s) / ED Diagnoses     ICD-10-CM   1. Secondary hypertension  I15.9     2. Nonintractable headache, unspecified chronicity pattern, unspecified headache type  R51.9       ED Discharge Orders          Ordered    cloNIDine  (CATAPRES ) 0.2 MG tablet  2 times daily PRN        01/30/24 2337              Discharge Instructions      You have been seen and discharged from the emergency department.  You were found to have high blood pressure and headache.  Your blood work, heart workup and CT of the head was normal.  You were given IV medicine for blood pressure control.  You have been prescribed a new agent.  Take clonidine  tablet up to 2 times a day as needed if the top number of your blood pressure (systolic) is over 160 for 3 separate reads.  Please call cardiology and primary doctor for in person evaluation and medication adjustment.  Follow-up with your primary provider for further evaluation and further care. Take home medications as prescribed. If you have any worsening symptoms, chest pain/difficulty breathing,  numbness/weakness or further concerns for your health please return to an emergency department for further evaluation.    Ozell HERO. Theadore, MD [facility] Emergency Medicine [facility] [email or web address]    Theadore Ozell HERO, MD 01/31/24 [phone]  "

## 2024-01-30 NOTE — ED Triage Notes (Signed)
 She has had a headache and high blood pressure. She says she's a little dizzy. Her BP has been high for the past 3 weeks. She noticed a headache about a hour and a half ago ago and said she doesn't feel right. Denies numbness and tingling, no visual changes.

## 2024-01-30 NOTE — Discharge Instructions (Addendum)
 You have been seen and discharged from the emergency department.  You were found to have high blood pressure and headache.  Your blood work, heart workup and CT of the head was normal.  You were given IV medicine for blood pressure control.  You have been prescribed a new agent.  Take clonidine  tablet up to 2 times a day as needed if the top number of your blood pressure (systolic) is over 160 for 3 separate reads.  Please call cardiology and primary doctor for in person evaluation and medication adjustment.  Follow-up with your primary provider for further evaluation and further care. Take home medications as prescribed. If you have any worsening symptoms, chest pain/difficulty breathing, numbness/weakness or further concerns for your health please return to an emergency department for further evaluation.

## 2024-01-30 NOTE — ED Provider Notes (Addendum)
 " [place] [facility] Provider Note   CSN: [id number] Arrival date & time: 01/30/24  1952     Patient presents with: Hypertension and Headache   Amy Tapia is a 89 y.o. female.   HPI   89 year old female presents emergency department with concern for elevated blood pressure and headache.  Patient states that this started around 6 PM at home.  Her home blood pressure monitor was showing systolics over 200s.  Following this she had an episode of dizziness that is now resolved.  She has been compliant with her home medications but states her blood pressure has been uncooperative since around Christmas.  Currently she denies any acute numbness/weakness.  No vision loss.  Prior to Admission medications  Medication Sig Start Date End Date Taking? Authorizing Provider  Ascorbic Acid  (VITAMIN C ) 100 MG tablet Take 100 mg by mouth daily.    [provider]  aspirin  81 MG tablet Take 81 mg by mouth daily.    [provider]  atenolol  (TENORMIN ) 50 MG tablet TAKE 1 TABLET BY MOUTH 2 TIMES A DAY 01/30/24   Nche, Roselie Rockford, NP  benzonatate  (TESSALON ) 100 MG capsule Take 1 capsule (100 mg total) by mouth every 8 (eight) hours. 12/13/23   Scott, Rocky SAILOR, PA-C  cholecalciferol  (VITAMIN D) 1000 UNITS tablet Take 1,000 Units by mouth daily.    [provider]  Docosahexaenoic Acid (ALGAL OMEGA-3 DHA PO) Take by mouth. 05/07/19   [provider]  Fish Oil-Cholecalciferol  (FISH OIL + D3 PO) Take 1,000 mg by mouth daily.    [provider]  glucosamine-chondroitin 500-400 MG tablet Take by mouth. 05/07/19   [provider]  latanoprost  (XALATAN ) 0.005 % ophthalmic solution Place 1 drop into both eyes at bedtime.    [provider]  lisinopril  (ZESTRIL ) 40 MG tablet Take 1 tablet (40 mg total) by mouth daily. 04/22/23 12/21/23  Santo Stanly LABOR, MD  meloxicam  (MOBIC ) 15 MG tablet Take 15 mg by mouth  daily. 10/27/23   [provider]  nitroGLYCERIN  (NITROSTAT ) 0.4 MG SL tablet Place 1 tablet (0.4 mg total) under the tongue every 5 (five) minutes as needed for chest pain. No more than 3tabs in 24hrs. Call 911 if pain does not improve 08/18/21   Nche, Roselie Rockford, NP  rosuvastatin  (CRESTOR ) 5 MG tablet Take 1 tablet (5 mg total) by mouth 3 (three) times a week. 06/10/23   Nche, Roselie Rockford, NP  tiZANidine  (ZANAFLEX ) 4 MG tablet Take 1 tablet (4 mg total) by mouth daily as needed for muscle spasms. Do not take with ambien  05/04/23   Nche, Roselie Rockford, NP  vitamin E  100 UNIT capsule Take 100 Units by mouth daily.    [provider]  zolpidem  (AMBIEN ) 5 MG tablet Take 1 tablet (5 mg total) by mouth at bedtime as needed for sleep. 12/21/23   Nche, Roselie Rockford, NP    Allergies: Nisoldipine, Penicillins, and Amlodipine besylate    Review of Systems  Constitutional:  Negative for fever.  Respiratory:  Negative for shortness of breath.   Cardiovascular:  Negative for chest pain.  Gastrointestinal:  Negative for abdominal pain, diarrhea and vomiting.  Skin:  Negative for rash.  Neurological:  Positive for light-headedness and headaches. Negative for facial asymmetry, speech difficulty, weakness and numbness.    Updated Vital Signs BP (!) 158/57   Pulse (!) 57   Temp 97.9 F (36.6 C)   Resp 17  Ht 5' 7 (1.702 m)   Wt 79.4 kg   SpO2 95%   BMI 27.41 kg/m   Physical Exam Vitals and nursing note reviewed.  Constitutional:      General: She is not in acute distress.    Appearance: Normal appearance.  HENT:     Head: Normocephalic.     Mouth/Throat:     Mouth: Mucous membranes are moist.  Cardiovascular:     Rate and Rhythm: Normal rate.  Pulmonary:     Effort: Pulmonary effort is normal. No respiratory distress.  Abdominal:     Palpations: Abdomen is soft.     Tenderness: There is no abdominal tenderness.  Skin:    General: Skin is warm.  Neurological:      Mental Status: She is alert and oriented to person, place, and time. Mental status is at baseline.     Cranial Nerves: No cranial nerve deficit.  Psychiatric:        Mood and Affect: Mood normal.     (all labs ordered are listed, but only abnormal results are displayed) Labs Reviewed  COMPREHENSIVE METABOLIC PANEL WITH GFR - Abnormal; Notable for the following components:      Result Value   Glucose, Bld 168 (*)    GFR, Estimated 55 (*)    All other components within normal limits  CBG MONITORING, ED - Abnormal; Notable for the following components:   Glucose-Capillary 179 (*)    All other components within normal limits  I-STAT CHEM 8, ED - Abnormal; Notable for the following components:   Creatinine, Ser 1.10 (*)    Glucose, Bld 167 (*)    Calcium , Ion 1.08 (*)    All other components within normal limits  CBG MONITORING, ED - Abnormal; Notable for the following components:   Glucose-Capillary 161 (*)    All other components within normal limits  PROTIME-INR  APTT  CBC  DIFFERENTIAL  ETHANOL    EKG: None  Radiology: CT HEAD WO CONTRAST Result Date: 01/30/2024 EXAM: CT HEAD WITHOUT CONTRAST 01/30/2024 09:55:31 PM TECHNIQUE: CT of the head was performed without the administration of intravenous contrast. Automated exposure control, iterative reconstruction, and/or weight based adjustment of the mA/kV was utilized to reduce the radiation dose to as low as reasonably achievable. COMPARISON: 10/31/2018 CLINICAL HISTORY: Headache, increasing frequency or severity. FINDINGS: BRAIN AND VENTRICLES: Intracranial atherosclerosis. No acute hemorrhage. No evidence of acute infarct. No hydrocephalus. No extra-axial collection. No mass effect or midline shift. ORBITS: No acute abnormality. SINUSES: No acute abnormality. SOFT TISSUES AND SKULL: Status post right mastectomy. No acute soft tissue abnormality. No skull fracture. IMPRESSION: 1. No acute intracranial abnormality. Electronically  signed by: Pinkie Pebbles MD 01/30/2024 09:58 PM EST RP Workstation: [id number]     Procedures   Medications Ordered in the ED  sodium chloride  flush (NS) 0.9 % injection 3 mL (3 mLs Intravenous Not Given 01/30/24 2142)                                    Medical Decision Making Amount and/or Complexity of Data Reviewed Labs: ordered. Radiology: ordered.  Risk OTC drugs. Prescription drug management.   89 year old female presents emergency department with concern for headache that developed around 6 PM associated with high blood pressure.  Patient states since around Christmas her home medications have not been controlling her blood pressure well.  She states her typical baseline systolic is  around 120.  Recently has been around 160-180 and today when she had the headache it was over 200.  Denies any other acute illness.  Had some lightheadedness earlier today but this is resolved, no focal neuro complaint.  No chest pain shortness of breath.  Patient is hypertensive on arrival,  191/68.  Currently complaining of a mild generalized headache.  EKG is unremarkable.  Blood work is normal.  CT of the head shows no acute finding.  Blood pressure was initially downtrending on its own but then elevated back to systolic of 180.  Plan to give IV dose of hydralazine  and Tylenol  for headache.  Will monitor for blood pressure control.  Discussed with patient outpatient follow-up for medication adjustment.  Will provide.  Medication as needed for hypertension.  Patient pending medication and discharge.     Final diagnoses:  None    ED Discharge Orders     None          Bari Roxie HERO, DO 01/30/24 2339    Bari Roxie HERO, DO 01/30/24 2357  "

## 2024-01-31 MED ORDER — HYDRALAZINE HCL 20 MG/ML IJ SOLN
2.0000 mg | Freq: Once | INTRAMUSCULAR | Status: AC
  Administered 2024-01-31: 2 mg via INTRAVENOUS

## 2024-02-02 ENCOUNTER — Ambulatory Visit: Payer: Self-pay | Admitting: Nurse Practitioner

## 2024-02-02 ENCOUNTER — Ambulatory Visit (INDEPENDENT_AMBULATORY_CARE_PROVIDER_SITE_OTHER): Admitting: Nurse Practitioner

## 2024-02-02 ENCOUNTER — Encounter: Payer: Self-pay | Admitting: Nurse Practitioner

## 2024-02-02 VITALS — BP 140/70 | HR 56 | Temp 97.9°F | Ht 67.0 in | Wt 190.2 lb

## 2024-02-02 DIAGNOSIS — E1169 Type 2 diabetes mellitus with other specified complication: Secondary | ICD-10-CM

## 2024-02-02 DIAGNOSIS — I10 Essential (primary) hypertension: Secondary | ICD-10-CM

## 2024-02-02 LAB — HEMOGLOBIN A1C: Hgb A1c MFr Bld: 6.4 % (ref 4.6–6.5)

## 2024-02-02 LAB — RENAL FUNCTION PANEL
Albumin: 3.7 g/dL (ref 3.5–5.2)
BUN: 14 mg/dL (ref 6–23)
CO2: 27 meq/L (ref 19–32)
Calcium: 9 mg/dL (ref 8.4–10.5)
Chloride: 107 meq/L (ref 96–112)
Creatinine, Ser: 0.84 mg/dL (ref 0.40–1.20)
GFR: 62.17 mL/min
Glucose, Bld: 137 mg/dL — ABNORMAL HIGH (ref 70–99)
Phosphorus: 3 mg/dL (ref 2.3–4.6)
Potassium: 4 meq/L (ref 3.5–5.1)
Sodium: 142 meq/L (ref 135–145)

## 2024-02-02 MED ORDER — HYDROCHLOROTHIAZIDE 12.5 MG PO CAPS: 12.5000 mg | ORAL_CAPSULE | Freq: Every morning | ORAL | 5 refills | Status: AC

## 2024-02-02 NOTE — Assessment & Plan Note (Addendum)
 Elevated BP led to ED visit 3days ago. She reports compliance with atenolol  50mg  BID and lisinopril  40mg  daily. 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 Hx of aortic valve insufficiency Reviewed ECG and labs completed on 01/20/2024: decline in renal function and elevated glucose noted Reviewed Echo completed 2024 BP Readings from Last 3 Encounters:  02/02/24 (!) 140/70  01/31/24 (!) 173/61  12/21/23 138/74    Repeat renal function: Improved renal function Maintain adequate oral hydration-at least 60oz daily I sent hydrochlorothiazide  12.5mg  to help with BP control Maintain atenolol  and lisinopril  dose. F/up in 2weeks as discussed Has upcoming appointment with cardiology 02/17/2024.

## 2024-02-02 NOTE — Patient Instructions (Addendum)
 Go to lab Will send hydrochlorothiazide  if normal renal function Maintain adequate oral hydration and low salt diet Maintain upcoming appointment with cardiology

## 2024-02-02 NOTE — Assessment & Plan Note (Signed)
 Repeat hgbA1c at 6.4% Controlled DIABETES No med needed at this time No neuropathy No retinopathy No nephropathy Current use of statin and ACE-I  F/up in 3months

## 2024-02-02 NOTE — Progress Notes (Signed)
 "               Established Patient Visit  Patient: Amy Tapia   DOB: [birth year]   89 y.o. Female  MRN: [id number] Visit Date: 02/02/2024  Subjective:    Chief Complaint  Patient presents with   Follow-up    Recent ED visit for elevated blood pressure, feeling very tired and anxious    HPI Primary hypertension Elevated BP led to ED visit 3days ago. She reports compliance with atenolol  50mg  BID and lisinopril  40mg  daily. 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 Hx of aortic valve insufficiency Reviewed ECG and labs completed on 01/20/2024: decline in renal function and elevated glucose noted Reviewed Echo completed 2024 BP Readings from Last 3 Encounters:  02/02/24 (!) 140/70  01/31/24 (!) 173/61  12/21/23 138/74    Repeat renal function: Improved renal function Maintain adequate oral hydration-at least 60oz daily I sent hydrochlorothiazide  12.5mg  to help with BP control Maintain atenolol  and lisinopril  dose. F/up in 2weeks as discussed Has upcoming appointment with cardiology 02/17/2024.  DM (diabetes mellitus) (HCC) Repeat hgbA1c at 6.4% Controlled DIABETES No med needed at this time No neuropathy No retinopathy No nephropathy Current use of statin and ACE-I  F/up in 3months  Reviewed medical, surgical, and social history today  Medications: Show/hide medication list[1] Reviewed past medical and social history.   ROS per HPI above      Objective:  BP (!) 140/70 (BP Location: Left Arm, Patient Position: Sitting, Cuff Size: Normal)   Pulse (!) 56   Temp 97.9 F (36.6 C) (Oral)   Ht 5' 7 (1.702 m)   Wt 190 lb 3.2 oz (86.3 kg)   SpO2 96%   BMI 29.79 kg/m      Physical Exam Vitals and nursing note reviewed.  Cardiovascular:     Rate and Rhythm: Normal rate.     Pulses: Normal pulses.     Heart sounds: Murmur heard.  Pulmonary:     Effort: Pulmonary effort is normal.  Musculoskeletal:     Right lower leg: No edema.      Left lower leg: No edema.  Neurological:     Mental Status: She is alert and oriented to person, place, and time.     Results for orders placed or performed in visit on 02/02/24  Renal Function Panel  Result Value Ref Range   Sodium 142 135 - 145 mEq/L   Potassium 4.0 3.5 - 5.1 mEq/L   Chloride 107 96 - 112 mEq/L   CO2 27 19 - 32 mEq/L   Albumin 3.7 3.5 - 5.2 g/dL   BUN 14 6 - 23 mg/dL   Creatinine, Ser 9.15 0.40 - 1.20 mg/dL   Glucose, Bld 862 (H) 70 - 99 mg/dL   Phosphorus 3.0 2.3 - 4.6 mg/dL   GFR 37.82 >39.99 mL/min   Calcium  9.0 8.4 - 10.5 mg/dL  Hemoglobin J8r  Result Value Ref Range   Hgb A1c MFr Bld 6.4 4.6 - 6.5 %      Assessment & Plan:    Problem List Items Addressed This Visit     DM (diabetes mellitus) (HCC)   Repeat hgbA1c at 6.4% Controlled DIABETES No med needed at this time No neuropathy No retinopathy No nephropathy Current use of statin and ACE-I  F/up in 3months      Relevant Orders   Hemoglobin A1c (Completed)   Primary hypertension - Primary   Elevated BP led to ED  visit 3days ago. She reports compliance with atenolol  50mg  BID and lisinopril  40mg  daily. 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 Hx of aortic valve insufficiency Reviewed ECG and labs completed on 01/20/2024: decline in renal function and elevated glucose noted Reviewed Echo completed 2024 BP Readings from Last 3 Encounters:  02/02/24 (!) 140/70  01/31/24 (!) 173/61  12/21/23 138/74    Repeat renal function: Improved renal function Maintain adequate oral hydration-at least 60oz daily I sent hydrochlorothiazide  12.5mg  to help with BP control Maintain atenolol  and lisinopril  dose. F/up in 2weeks as discussed Has upcoming appointment with cardiology 02/17/2024.      Relevant Medications   hydrochlorothiazide  (MICROZIDE ) 12.5 MG capsule   Other Relevant Orders   Renal Function Panel (Completed)   Hemoglobin A1c (Completed)   Return in  about 2 weeks (around 02/16/2024) for HTN.     Roselie Mood, NP      [1]  Outpatient Medications Prior to Visit  Medication Sig   Ascorbic Acid  (VITAMIN C ) 100 MG tablet Take 100 mg by mouth daily.   aspirin  81 MG tablet Take 81 mg by mouth daily.   atenolol  (TENORMIN ) 50 MG tablet TAKE 1 TABLET BY MOUTH 2 TIMES A DAY   benzonatate  (TESSALON ) 100 MG capsule Take 1 capsule (100 mg total) by mouth every 8 (eight) hours.   cholecalciferol  (VITAMIN D) 1000 UNITS tablet Take 1,000 Units by mouth daily.   cloNIDine  (CATAPRES ) 0.2 MG tablet Take 1 tablet (0.2 mg total) by mouth 2 (two) times daily as needed.   Docosahexaenoic Acid (ALGAL OMEGA-3 DHA PO) Take by mouth.   Fish Oil-Cholecalciferol  (FISH OIL + D3 PO) Take 1,000 mg by mouth daily.   glucosamine-chondroitin 500-400 MG tablet Take by mouth.   latanoprost  (XALATAN ) 0.005 % ophthalmic solution Place 1 drop into both eyes at bedtime.   lisinopril  (ZESTRIL ) 40 MG tablet Take 1 tablet (40 mg total) by mouth daily.   meloxicam  (MOBIC ) 15 MG tablet Take 15 mg by mouth daily.   nitroGLYCERIN  (NITROSTAT ) 0.4 MG SL tablet Place 1 tablet (0.4 mg total) under the tongue every 5 (five) minutes as needed for chest pain. No more than 3tabs in 24hrs. Call 911 if pain does not improve   rosuvastatin  (CRESTOR ) 5 MG tablet Take 1 tablet (5 mg total) by mouth 3 (three) times a week.   tiZANidine  (ZANAFLEX ) 4 MG tablet Take 1 tablet (4 mg total) by mouth daily as needed for muscle spasms. Do not take with ambien    vitamin E  100 UNIT capsule Take 100 Units by mouth daily.   zolpidem  (AMBIEN ) 5 MG tablet Take 1 tablet (5 mg total) by mouth at bedtime as needed for sleep.   No facility-administered medications prior to visit.   "

## 2024-02-11 NOTE — Progress Notes (Signed)
 Amy Tapia. Pilar Plate, MD [facility] Emergency Medicine [facility] [email or web address]

## 2024-02-13 ENCOUNTER — Other Ambulatory Visit: Payer: Self-pay | Admitting: Nurse Practitioner

## 2024-02-13 DIAGNOSIS — E1169 Type 2 diabetes mellitus with other specified complication: Secondary | ICD-10-CM

## 2024-02-13 DIAGNOSIS — I700 Atherosclerosis of aorta: Secondary | ICD-10-CM

## 2024-02-13 NOTE — Telephone Encounter (Signed)
 Refill request received for Rosuvastatin  5mg  FOV:02/16/24 LOV:02/02/24 Last refill: 06/10/2023  Medication has been denied. Requested too soon. No due until May

## 2024-02-16 ENCOUNTER — Ambulatory Visit: Admitting: Nurse Practitioner

## 2024-02-17 ENCOUNTER — Encounter: Payer: Self-pay | Admitting: Emergency Medicine

## 2024-02-17 ENCOUNTER — Ambulatory Visit: Admitting: Emergency Medicine

## 2024-02-17 VITALS — BP 138/60 | HR 57 | Ht 68.0 in | Wt 188.0 lb

## 2024-02-17 DIAGNOSIS — E785 Hyperlipidemia, unspecified: Secondary | ICD-10-CM

## 2024-02-17 DIAGNOSIS — I351 Nonrheumatic aortic (valve) insufficiency: Secondary | ICD-10-CM

## 2024-02-17 DIAGNOSIS — I2510 Atherosclerotic heart disease of native coronary artery without angina pectoris: Secondary | ICD-10-CM

## 2024-02-17 DIAGNOSIS — I440 Atrioventricular block, first degree: Secondary | ICD-10-CM

## 2024-02-17 DIAGNOSIS — I10 Essential (primary) hypertension: Secondary | ICD-10-CM

## 2024-02-17 NOTE — Patient Instructions (Signed)
 Medication Instructions:  NO CHANGES  Lab Work: NONE TO BE DONE TODAY.  Testing/Procedures: NONE  Follow-Up: At [facility], you and your health needs are our priority.  As part of our continuing mission to provide you with exceptional heart care, our providers are all part of one team.  This team includes your primary Cardiologist (physician) and Advanced Practice Providers or APPs (Physician Assistants and Nurse Practitioners) who all work together to provide you with the care you need, when you need it.  Your next appointment:   3 MONTHS  Provider:   MADISON FOUNTAIN, NP  We recommend signing up for the patient portal called MyChart.  Sign up information is provided on this After Visit Summary.  MyChart is used to connect with patients for Virtual Visits (Telemedicine).  Patients are able to view lab/test results, encounter notes, upcoming appointments, etc.  Non-urgent messages can be sent to your provider as well.   To learn more about what you can do with MyChart, go to [email or web address].   Other Instructions:

## 2024-02-17 NOTE — Progress Notes (Signed)
 " Cardiology Office Note:    Date:  02/17/2024  ID:  Amy Tapia, Amy Tapia [birth year], MRN [id number] PCP: Katheen Roselie Rockford, NP  [facility] Providers Cardiologist:  Stanly DELENA Leavens, MD Cardiology APP:  Rana Lum CROME, NP       Patient Profile:       Chief Complaint: 1 year follow-up History of Present Illness:  Amy Tapia is a 89 y.o. female with visit-pertinent history of aortic atherosclerosis, hyperlipidemia, hypertension, prior breast cancer (distant) with radical mastectomy only  Coronary CTA 03/01/2022 showed coronary calcium  score of 0, nonobstructive CAD, noncalcified plaque in the proximal RCA causing minimal stenosis.  Last seen in clinic on 07/12/2022.  Patient was doing well at that time.  EKG showed sinus bradycardia with first-degree heart block.  It was noted she had whitecoat hypertension but blood pressure log at home is at goal.  Echocardiogram 08/10/2022 showed LVEF 60 to 65%, no RWMA, grade 1 DD, RV function and size normal, normal PASP, mild aortic valve regurgitation, mild mitral valve regurgitation.  Patient called into triage line on 04/22/2023 noting elevated blood pressures.  Patient was started on lisinopril  40 mg daily.  Patient was recently seen in the emergency department on 01/30/2024 due to headache and hypertension.  Patient noted her blood pressures have recently been around 160-180 at home.  Patient's blood pressure in the ED was 191/68.  EKG was unremarkable and blood work was normal.  CT of the head showed no acute findings.  Patient was given IV hydralazine .  Patient was prescribed clonidine  as needed.  She was seen for follow-up by her PCP on 02/02/2024.  Blood pressure was elevated at 140/70.  She was started on hydrochlorothiazide  12.5 mg daily.  Discussed the use of AI scribe software for clinical note transcription with the patient, who gave verbal consent to proceed.  History of Present Illness Amy Tapia is an 89 year old  female with hypertension who presents for follow-up after a recent emergency room visit for hypertension.  Today patient tells me she is doing well with better controlled blood pressures.  She has used clonidine  only twice when home blood pressures were above 160 mmHg on three readings ten minutes apart.  Her PCP started hydrochlorothiazide , which has improved her blood pressure control.  Her average blood pressures in over the past 2 weeks have averaged 130s over 60s.  Current medications are atenolol , hydrochlorothiazide , lisinopril , and clonidine  as needed. She takes atenolol  and hydrochlorothiazide  in the morning, lisinopril  at noon, and atenolol  at night, and has not needed clonidine  this month.  When her blood pressure is high she has headaches and a funny feeling in her head, sometimes with a chills-like sensation that improves with scratching.   She has no chest pain or shortness of breath.  She denies orthopnea, PND, palpitations, syncope, or presyncope.  She lives in [place], stays active with household tasks, and cares for her husband with back and heart problems. She does not perform structured exercise but remains active through daily activities.  She denies any exertional symptoms.   Review of systems:  Please see the history of present illness. All other systems are reviewed and otherwise negative.      Studies Reviewed:    EKG Interpretation Date/Time:  Friday February 17 2024 15:01:56 EST Ventricular Rate:  57 PR Interval:  272 QRS Duration:  98 QT Interval:  422 QTC Calculation: 410 R Axis:   -2  Text Interpretation: Sinus bradycardia with 1st  degree A-V block Minimal voltage criteria for LVH, may be normal variant ( R in aVL ) Cannot rule out Anterior infarct , age undetermined When compared with ECG of 30-Jan-2024 21:27, PREVIOUS ECG IS PRESENT Confirmed by Rana Dixon [id number] on 02/17/2024 3:11:46 PM    Echocardiogram 08/10/2022 1. The aortic valve is  tricuspid. Aortic valve regurgitation is mild.  Aortic valve sclerosis is present, with no evidence of aortic valve  stenosis.   2. Left ventricular ejection fraction, by estimation, is 60 to 65%. The  left ventricle has normal function. The left ventricle has no regional  wall motion abnormalities. Left ventricular diastolic parameters are  consistent with Grade I diastolic  dysfunction (impaired relaxation). The average left ventricular global  longitudinal strain is -19.0 %. The global longitudinal strain is normal.   3. Right ventricular systolic function is normal. The right ventricular  size is normal. There is normal pulmonary artery systolic pressure. The  estimated right ventricular systolic pressure is 30.2 mmHg.   4. The mitral valve is grossly normal. Mild mitral valve regurgitation.  No evidence of mitral stenosis.   5. The inferior vena cava is normal in size with greater than 50%  respiratory variability, suggesting right atrial pressure of 3 mmHg.   Coronary CTA 03/01/2022 1. Coronary calcium  score of 0.   2. Normal coronary origin with right dominance.   3. Nonobstructive CAD   4. Noncalcified plaque in proximal RCA causes minimal (0-24%) stenosis   CAD-RADS 1. Minimal non-obstructive CAD (0-24%). Consider non-atherosclerotic causes of chest pain. Consider preventive therapy and risk factor modification. Risk Assessment/Calculations:              Physical Exam:   VS:  BP 138/60 (BP Location: Left Arm, Patient Position: Sitting, Cuff Size: Normal)   Pulse (!) 57   Ht 5' 8 (1.727 m)   Wt 188 lb (85.3 kg)   BMI 28.59 kg/m    Wt Readings from Last 3 Encounters:  02/17/24 188 lb (85.3 kg)  02/02/24 190 lb 3.2 oz (86.3 kg)  01/30/24 175 lb (79.4 kg)    GEN: Well nourished, well developed in no acute distress NECK: No JVD; No carotid bruits CARDIAC: RRR, no murmurs, rubs, gallops RESPIRATORY:  Clear to auscultation without rales, wheezing or rhonchi   ABDOMEN: Soft, non-tender, non-distended EXTREMITIES:  No edema; No acute deformity      Assessment and Plan:  Hypertension Blood pressure today is well-controlled at 138/60 She has been dealing with hypertensive blood pressure readings over the past several months.  There has been improvement since her PCP started her on hydrochlorothiazide  1 month ago.  Currently taking clonidine  as needed but has only had to take twice for blood pressure greater than 160 - Home blood pressure average over the past 3 weeks is 130/60 - She reports late afternoon blood pressures seem to be when her blood pressures are mostly elevated.  I will have her take lisinopril  at 2 PM - No changes to current regimen - Continue atenolol  50 mg twice daily, hydrochlorothiazide  12.5 mg daily in the a.m., lisinopril  40 mg daily in the afternoon, and clonidine  0.2 mg as needed  Coronary artery disease Coronary CTA 02/2022 showed coronary calcium  as of 0 with nonobstructive CAD showing noncalcified plaque in the proximal RCA with minimal 0-24% stenosis - EKG today without acute ischemic features - Today patient is stable without chest pains.  Remains active within the home without exertional symptoms.  No indication of further ischemic  evaluation at this time -Continue aspirin  81 mg daily and rosuvastatin  5 mg 3 times weekly  Hyperlipidemia, LDL goal <70 LDL 69 on 03/2023 and well-controlled - Continue rosuvastatin  5 mg 3 times weekly (highest tolerated)  Sinus bradycardia with 1HB EKG today shows heart rate of 57 bpm with first-degree heart block - She remains asymptomatic without syncope/near syncope, lightheadedness, or dizziness - Continue to monitor at this time  Aortic valve regurgitation Echocardiogram 07/2022 showed mild aortic valve regurgitation - No further interventions warranted at this time       Dispo: Return in 3 months for blood pressure check  Signed, Lum LITTIE Louis, NP  "

## 2024-03-16 ENCOUNTER — Ambulatory Visit: Admitting: Nurse Practitioner

## 2024-05-03 ENCOUNTER — Ambulatory Visit: Admitting: Nurse Practitioner

## 2024-05-16 ENCOUNTER — Ambulatory Visit: Admitting: Emergency Medicine

## 2024-10-08 ENCOUNTER — Ambulatory Visit
